# Patient Record
Sex: Female | Born: 1935 | Race: White | Hispanic: No | Smoking: Never smoker
Health system: Southern US, Community
[De-identification: ages and names within clinical notes are randomized; demographics above are authoritative.]

## PROBLEM LIST (undated history)

## (undated) DIAGNOSIS — D649 Anemia, unspecified: Secondary | ICD-10-CM

## (undated) DIAGNOSIS — I639 Cerebral infarction, unspecified: Secondary | ICD-10-CM

## (undated) DIAGNOSIS — I4819 Other persistent atrial fibrillation: Secondary | ICD-10-CM

## (undated) DIAGNOSIS — E039 Hypothyroidism, unspecified: Secondary | ICD-10-CM

## (undated) DIAGNOSIS — S22080A Wedge compression fracture of T11-T12 vertebra, initial encounter for closed fracture: Secondary | ICD-10-CM

## (undated) DIAGNOSIS — A6920 Lyme disease, unspecified: Secondary | ICD-10-CM

## (undated) HISTORY — DX: Lyme disease, unspecified: A69.20

## (undated) HISTORY — DX: Wedge compression fracture of T11-T12 vertebra, initial encounter for closed fracture: S22.080A

## (undated) HISTORY — PX: BLADDER SUSPENSION: SHX72

## (undated) HISTORY — DX: Other persistent atrial fibrillation: I48.19

---

## 1966-03-21 HISTORY — PX: ABDOMINAL HYSTERECTOMY: SHX81

## 2014-08-25 ENCOUNTER — Encounter (HOSPITAL_COMMUNITY): Payer: Self-pay

## 2014-08-25 ENCOUNTER — Emergency Department (HOSPITAL_COMMUNITY)
Admission: EM | Admit: 2014-08-25 | Discharge: 2014-08-25 | Disposition: A | Discharge status: Home / Self Care | Payer: Medicare Other | Attending: Emergency Medicine | Admitting: Emergency Medicine

## 2014-08-25 DIAGNOSIS — I951 Orthostatic hypotension: Secondary | ICD-10-CM | POA: Diagnosis not present

## 2014-08-25 DIAGNOSIS — E871 Hypo-osmolality and hyponatremia: Secondary | ICD-10-CM | POA: Diagnosis not present

## 2014-08-25 DIAGNOSIS — Z87828 Personal history of other (healed) physical injury and trauma: Secondary | ICD-10-CM | POA: Insufficient documentation

## 2014-08-25 DIAGNOSIS — R52 Pain, unspecified: Secondary | ICD-10-CM

## 2014-08-25 DIAGNOSIS — D696 Thrombocytopenia, unspecified: Secondary | ICD-10-CM

## 2014-08-25 HISTORY — DX: Anemia, unspecified: D64.9

## 2014-08-25 LAB — CBC WITH DIFFERENTIAL/PLATELET
BASOS ABS: 0 10*3/uL (ref 0.0–0.1)
Basophils Relative: 1 % (ref 0–1)
Eosinophils Absolute: 0 10*3/uL (ref 0.0–0.7)
Eosinophils Relative: 0 % (ref 0–5)
HEMATOCRIT: 37.5 % (ref 36.0–46.0)
Hemoglobin: 12.5 g/dL (ref 12.0–15.0)
Lymphocytes Relative: 37 % (ref 12–46)
Lymphs Abs: 1.6 10*3/uL (ref 0.7–4.0)
MCH: 29.3 pg (ref 26.0–34.0)
MCHC: 33.3 g/dL (ref 30.0–36.0)
MCV: 87.8 fL (ref 78.0–100.0)
Monocytes Absolute: 0.3 10*3/uL (ref 0.1–1.0)
Monocytes Relative: 7 % (ref 3–12)
NEUTROS PCT: 55 % (ref 43–77)
Neutro Abs: 2.4 10*3/uL (ref 1.7–7.7)
PLATELETS: 115 10*3/uL — AB (ref 150–400)
RBC: 4.27 MIL/uL (ref 3.87–5.11)
RDW: 13.6 % (ref 11.5–15.5)
WBC: 4.3 10*3/uL (ref 4.0–10.5)

## 2014-08-25 LAB — COMPREHENSIVE METABOLIC PANEL
ALT: 20 U/L (ref 14–54)
AST: 39 U/L (ref 15–41)
Albumin: 4 g/dL (ref 3.5–5.0)
Alkaline Phosphatase: 57 U/L (ref 38–126)
Anion gap: 10 (ref 5–15)
BUN: 19 mg/dL (ref 6–20)
CHLORIDE: 98 mmol/L — AB (ref 101–111)
CO2: 25 mmol/L (ref 22–32)
Calcium: 9 mg/dL (ref 8.9–10.3)
Creatinine, Ser: 0.78 mg/dL (ref 0.44–1.00)
GFR calc non Af Amer: >60 mL/min (ref >60)
Glucose, Bld: 117 mg/dL — ABNORMAL HIGH (ref 65–99)
POTASSIUM: 4.4 mmol/L (ref 3.5–5.1)
Sodium: 133 mmol/L — ABNORMAL LOW (ref 135–145)
Total Bilirubin: 0.5 mg/dL (ref 0.3–1.2)
Total Protein: 7.9 g/dL (ref 6.5–8.1)

## 2014-08-25 LAB — URINALYSIS, ROUTINE W REFLEX MICROSCOPIC
BILIRUBIN URINE: NEGATIVE
Glucose, UA: NEGATIVE mg/dL
Hgb urine dipstick: NEGATIVE
Ketones, ur: 15 mg/dL — AB
NITRITE: POSITIVE — AB
PH: 7 (ref 5.0–8.0)
Protein, ur: NEGATIVE mg/dL
SPECIFIC GRAVITY, URINE: 1.008 (ref 1.005–1.030)
Urobilinogen, UA: 0.2 mg/dL (ref 0.0–1.0)

## 2014-08-25 LAB — URINE MICROSCOPIC-ADD ON

## 2014-08-25 LAB — I-STAT TROPONIN, ED: TROPONIN I, POC: 0 ng/mL (ref 0.00–0.08)

## 2014-08-25 LAB — LIPASE, BLOOD: Lipase: 50 U/L (ref 22–51)

## 2014-08-25 MED ORDER — ONDANSETRON HCL 4 MG PO TABS: 4.0000 mg | ORAL_TABLET | Freq: Four times a day (QID) | ORAL | Status: DC

## 2014-08-25 MED ORDER — SODIUM CHLORIDE 0.9 % IV BOLUS (SEPSIS)
500.0000 mL | Freq: Once | INTRAVENOUS | Status: AC
  Administered 2014-08-25: 500 mL via INTRAVENOUS

## 2014-08-25 MED ORDER — SODIUM CHLORIDE 0.9 % IV BOLUS (SEPSIS): 500.0000 mL | Freq: Once | INTRAVENOUS | Status: DC

## 2014-08-25 MED ORDER — ONDANSETRON HCL 4 MG/2ML IJ SOLN: 4.0000 mg | Freq: Once | INTRAMUSCULAR | Status: DC

## 2014-08-25 MED ORDER — DOXYCYCLINE HYCLATE 100 MG PO CAPS: 100.0000 mg | ORAL_CAPSULE | Freq: Two times a day (BID) | ORAL | Status: DC

## 2014-08-25 MED ORDER — IBUPROFEN 200 MG PO TABS
200.0000 mg | ORAL_TABLET | Freq: Once | ORAL | Status: AC
  Administered 2014-08-25: 200 mg via ORAL
  Filled 2014-08-25: qty 1

## 2014-08-25 MED ORDER — ONDANSETRON 4 MG PO TBDP
4.0000 mg | ORAL_TABLET | Freq: Once | ORAL | Status: AC
  Administered 2014-08-25: 4 mg via ORAL
  Filled 2014-08-25: qty 1

## 2014-08-25 MED ORDER — DOXYCYCLINE HYCLATE 100 MG PO TABS
100.0000 mg | ORAL_TABLET | Freq: Once | ORAL | Status: AC
  Administered 2014-08-25: 100 mg via ORAL
  Filled 2014-08-25: qty 1

## 2014-08-25 NOTE — ED Provider Notes (Signed)
CSN: [id number]     Arrival date & time 08/25/14  1130 History   First MD Initiated Contact with Patient 08/25/14 1245     Chief Complaint  Patient presents with  . Tick Removal  . Generalized Body Aches     (Consider location/radiation/quality/duration/timing/severity/associated sxs/prior Treatment) HPI  Caitlyn Henry is a 81 y.o. female with PMH of anemia presenting with one week of generalized weakness, body aches and lightheadedness with standing. No vertiginous symptoms. Pt also reports 20 tick bites over the last few months without rash. Pt reports intermittent headaches that develop gradually frontal and intermittent over last week. No visual changes, slurred speech, confusion or focal weakness. Decreased appetite with occasional nausea. No emesis or abdominal pain. No fevers or chills. Pt has not seen a doctor since the "80s" per family. Pt takes no medication. No head injury, syncope, chest pain, SOB.    Past Medical History  Diagnosis Date  . Anemia    Past Surgical History  Procedure Laterality Date  . Abdominal hysterectomy    . Bladder suspension     No family history on file. History  Substance Use Topics  . Smoking status: Never Smoker   . Smokeless tobacco: Not on file  . Alcohol Use: Yes     Comment: rarely   OB History    No data available     Review of Systems 10 Systems reviewed and are negative for acute change except as noted in the HPI.    Allergies  Tylenol  Home Medications   Prior to Admission medications   Medication Sig Start Date End Date Taking? Authorizing Provider  naproxen sodium (ANAPROX) 220 MG tablet Take 220 mg by mouth 2 (two) times daily as needed (pain).   Yes Historical Provider, MD  doxycycline (VIBRAMYCIN) 100 MG capsule Take 1 capsule (100 mg total) by mouth 2 (two) times daily. 08/25/14   Oswaldo ConroyVictoria Eduardo Honor, PA-C   BP 194/57 mmHg  Pulse 71  Temp(Src) 97.7 F (36.5 C) (Oral)  Resp 19  Ht 5\' 5"  (1.651 m)  SpO2  96% Physical Exam  Constitutional: She appears well-developed and well-nourished. No distress.  HENT:  Head: Normocephalic and atraumatic.  Mouth/Throat: Oropharynx is clear and moist.  Eyes: Conjunctivae and EOM are normal. Pupils are equal, round, and reactive to light. Right eye exhibits no discharge. Left eye exhibits no discharge.  Neck: Normal range of motion. Neck supple.  No nuchal rigidity  Cardiovascular: Normal rate and regular rhythm.   Pulmonary/Chest: Effort normal and breath sounds normal. No respiratory distress. She has no wheezes.  Abdominal: Soft. Bowel sounds are normal. She exhibits no distension. There is no tenderness.  Neurological: She is alert. No cranial nerve deficit. Coordination normal.  Speech is clear and goal oriented. Peripheral visual fields intact. Strength 5/5 in upper and lower extremities. Sensation intact. Intact rapid alternating movements, finger to nose, and heel to shin. No pronator drift. Normal gait.  Skin: Skin is warm and dry. She is not diaphoretic.  Nursing note and vitals reviewed.   ED Course  Procedures (including critical care time) Labs Review Labs Reviewed  CBC WITH DIFFERENTIAL/PLATELET - Abnormal; Notable for the following:    Platelets 115 (*)    All other components within normal limits  COMPREHENSIVE METABOLIC PANEL - Abnormal; Notable for the following:    Sodium 133 (*)    Chloride 98 (*)    Glucose, Bld 117 (*)    All other components within normal limits  LIPASE,  BLOOD  URINALYSIS, ROUTINE W REFLEX MICROSCOPIC (NOT AT [facility])  I-STAT TROPOININ, ED    Imaging Review No results found.   EKG Interpretation   Date/Time:  Monday August 25 2014 12:50:14 EDT Ventricular Rate:  68 PR Interval:  188 QRS Duration: 91 QT Interval:  422 QTC Calculation: 449 R Axis:   57 Text Interpretation:  Sinus rhythm Atrial premature complexes in couplets  Anterior infarct, old Confirmed by Lincoln Brigham [phone]) on 08/25/2014 12:59:01   PM      MDM   Final diagnoses:  Pain, generalized  Orthostasis  Hyponatremia  Thrombocytopenia   Pt presenting with generalized pain, intermittent headaches, lightheadedness and decreased appetite with reported tick bites. No reported fevers. VSS. No fevers. Neurological exam without deficits. Pt ambulatory with steady gait with assistance. Pt orthostatic. Lab work reassuring with mild hyponatremia and thrombocytopenia. No rashes on exam. Will treat prophylactically with doxycycline for 14 days to cover tick borne illness. Pt with PCP follow up in 2 days for further work up. UA pending. Pt signed out to Ladona Mow PA-C pending UA results and discharge home with doxycycline.  Discussed return precautions with patient. Discussed all results and patient and family members verbalizes understanding and agrees with plan.  This is a shared patient. This patient was discussed with the physician who saw and evaluated the patient and agrees with the plan.     Oswaldo Conroy, PA-C 08/27/14 6045  Tilden Fossa, MD 08/27/14 [phone]

## 2014-08-25 NOTE — Discharge Instructions (Signed)
Return to the emergency room with worsening of symptoms, new symptoms or with symptoms that are concerning, especially fevers, severe worsening of headache, visual or speech changes, weakness in face, arms or legs, unable to keep fluids down. Please take all of your antibiotics until finished!   You may develop abdominal discomfort or diarrhea from the antibiotic.  You may help offset this with probiotics which you can buy or get in yogurt. Do not eat  or take the probiotics until 2 hours after your antibiotic.  Please call your doctor for a followup appointment within 24-48 hours. When you talk to your doctor please let them know that you were seen in the emergency department and have them acquire all of your records so that they can discuss the findings with you and formulate a treatment plan to fully care for your new and ongoing problems.  Read below information and follow recommendations.  Orthostatic Hypotension Orthostatic hypotension is a sudden drop in blood pressure. It happens when you quickly stand up from a seated or lying position. You may feel dizzy or light-headed. This can last for just a few seconds or for up to a few minutes. It is usually not a serious problem. However, if this happens frequently or gets worse, it can be a sign of something more serious. CAUSES  Different things can cause orthostatic hypotension, including:   Loss of body fluids (dehydration).  Medicines that lower blood pressure.  Sudden changes in posture, such as standing up quickly after you have been sitting or lying down.  Taking too much of your medicine. SIGNS AND SYMPTOMS   Light-headedness or dizziness.   Fainting or near-fainting.   A fast heart rate.   Weakness.   Feeling tired (fatigue).  DIAGNOSIS  Your health care provider may do several things to help diagnose your condition and identify the cause. These may include:   Taking a medical history and doing a physical  exam.  Checking your blood pressure. Your health care provider will check your blood pressure when you are:  Lying down.  Sitting.  Standing.  Using tilt table testing. In this test, you lie down on a table that moves from a lying position to a standing position. You will be strapped onto the table. This test monitors your blood pressure and heart rate when you are in different positions. TREATMENT  Treatment will vary depending on the cause. Possible treatments include:   Changing the dosage of your medicines.  Wearing compression stockings on your lower legs.  Standing up slowly after sitting or lying down.  Eating more salt.  Eating frequent, small meals.  In some cases, getting IV fluids.  Taking medicine to enhance fluid retention. HOME CARE INSTRUCTIONS  Only take over-the-counter or prescription medicines as directed by your health care provider.  Follow your health care provider's instructions for changing the dosage of your current medicines.  Do not stop or adjust your medicine on your own.  Stand up slowly after sitting or lying down. This allows your body to adjust to the different position.  Wear compression stockings as directed.  Eat extra salt as directed.  Do not add extra salt to your diet unless directed to by your health care provider.  Eat frequent, small meals.  Avoid standing suddenly after eating.  Avoid hot showers or excessive heat as directed by your health care provider.  Keep all follow-up appointments. SEEK MEDICAL CARE IF:  You continue to feel dizzy or light-headed after standing.  You feel groggy or confused.  You feel cold, clammy, or sick to your stomach (nauseous).  You have blurred vision.  You feel short of breath. SEEK IMMEDIATE MEDICAL CARE IF:   You faint after standing.  You have chest pain.  You have difficulty breathing.   You lose feeling or movement in your arms or legs.   You have slurred speech  or difficulty talking, or you are unable to talk.  MAKE SURE YOU:   Understand these instructions.  Will watch your condition.  Will get help right away if you are not doing well or get worse. Document Released: 02/25/2002 Document Revised: 03/12/2013 Document Reviewed: 12/28/2012 [facility] Patient Information 2015 [place], [place]. This information is not intended to replace advice given to you by your health care provider. Make sure you discuss any questions you have with your health care provider.   Tick Bite Information Ticks are insects that attach themselves to the skin and draw blood for food. There are various types of ticks. Common types include wood ticks and deer ticks. Most ticks live in shrubs and grassy areas. Ticks can climb onto your body when you make contact with leaves or grass where the tick is waiting. The most common places on the body for ticks to attach themselves are the scalp, neck, armpits, waist, and groin. Most tick bites are harmless, but sometimes ticks carry germs that cause diseases. These germs can be spread to a person during the tick's feeding process. The chance of a disease spreading through a tick bite depends on:   The type of tick.  Time of year.   How long the tick is attached.   Geographic location.  HOW CAN YOU PREVENT TICK BITES? Take these steps to help prevent tick bites when you are outdoors:  Wear protective clothing. Long sleeves and long pants are best.   Wear white clothes so you can see ticks more easily.  Tuck your pant legs into your socks.   If walking on a trail, stay in the middle of the trail to avoid brushing against bushes.  Avoid walking through areas with long grass.  Put insect repellent on all exposed skin and along boot tops, pant legs, and sleeve cuffs.   Check clothing, hair, and skin repeatedly and before going inside.   Brush off any ticks that are not attached.  Take a shower or bath as soon as  possible after being outdoors.  WHAT IS THE PROPER WAY TO REMOVE A TICK? Ticks should be removed as soon as possible to help prevent diseases caused by tick bites.  If latex gloves are available, put them on before trying to remove a tick.   Using fine-point tweezers, grasp the tick as close to the skin as possible. You may also use curved forceps or a tick removal tool. Grasp the tick as close to its head as possible. Avoid grasping the tick on its body.  Pull gently with steady upward pressure until the tick lets go. Do not twist the tick or jerk it suddenly. This may break off the tick's head or mouth parts.  Do not squeeze or crush the tick's body. This could force disease-carrying fluids from the tick into your body.   After the tick is removed, wash the bite area and your hands with soap and water or other disinfectant such as alcohol.  Apply a small amount of antiseptic cream or ointment to the bite site.   Wash and disinfect any instruments that  were used.  Do not try to remove a tick by applying a hot match, petroleum jelly, or fingernail polish to the tick. These methods do not work and may increase the chances of disease being spread from the tick bite.  WHEN SHOULD YOU SEEK MEDICAL CARE? Contact your health care provider if you are unable to remove a tick from your skin or if a part of the tick breaks off and is stuck in the skin.  After a tick bite, you need to be aware of signs and symptoms that could be related to diseases spread by ticks. Contact your health care provider if you develop any of the following in the days or weeks after the tick bite:  Unexplained fever.  Rash. A circular rash that appears days or weeks after the tick bite may indicate the possibility of Lyme disease. The rash may resemble a target with a bull's-eye and may occur at a different part of your body than the tick bite.  Redness and swelling in the area of the tick bite.   Tender, swollen  lymph glands.   Diarrhea.   Weight loss.   Cough.   Fatigue.   Muscle, joint, or bone pain.   Abdominal pain.   Headache.   Lethargy or a change in your level of consciousness.  Difficulty walking or moving your legs.   Numbness in the legs.   Paralysis.  Shortness of breath.   Confusion.   Repeated vomiting.  Document Released: 03/04/2000 Document Revised: 12/26/2012 Document Reviewed: 08/15/2012 [facility] Patient Information 2015 [place]. This information is not intended to replace advice given to you by your health care provider. Make sure you discuss any questions you have with your health care provider.

## 2014-08-25 NOTE — ED Notes (Signed)
Pt here from urgent care for multiple tick bites since last Monday and HTN. Has been getting weaker since Monday. Has been having generalized body aches all over. No fever in triage. Denies chills or fever at home. Has been having occasional nausea but no vomiting or diarrhea. Decreased appetite.

## 2014-08-25 NOTE — ED Provider Notes (Signed)
Patient signed out to me by Oswaldo ConroyVictoria Creech, PA-C with plan to f/u on results of UA, and discharge when results return.    Patient here being seen and evaluated generalized pain, poor appetite, tick bite. Patient being discharged with doxycycline coverage for possible tickborne illness. Patient does have PCP follow-up in 2 days. Patient is well-appearing, hemodynamically stable in no acute distress. Patient's urinalysis shows positive for bacteria and nitrites. She denies any urinary complaints. Based on patient's age and the fact the patient is being sent home with some doxycycline, will not cover for UTI at this time. Urine culture sent. Patient to be discharged. Patient requesting prescription for Zofran, we'll provide with discharge paperwork. Return precautions discussed, patient and family verbalize understanding and agreement of this plan.  BP 194/57 mmHg  Pulse 71  Temp(Src) 97.7 F (36.5 C) (Oral)  Resp 19  Ht 5\' 5"  (1.651 m)  SpO2 96%  Signed,  Ladona MowJoe Doria Fern, PA-C 4:37 PM   Ladona MowJoe Merisa Julio, PA-C 08/25/14 1637  Benjiman CoreNathan Pickering, MD 08/26/14 [phone]

## 2014-08-28 LAB — URINE CULTURE

## 2014-08-29 ENCOUNTER — Telehealth: Payer: Self-pay | Admitting: *Deleted

## 2014-08-29 NOTE — ED Notes (Signed)
(+)  urine culture, treated with Doxycycline, OK per Georgina Pillion, Pharm

## 2015-03-22 DIAGNOSIS — A6920 Lyme disease, unspecified: Secondary | ICD-10-CM

## 2015-03-22 HISTORY — DX: Lyme disease, unspecified: A69.20

## 2015-06-20 DIAGNOSIS — I4819 Other persistent atrial fibrillation: Secondary | ICD-10-CM

## 2015-06-20 DIAGNOSIS — I639 Cerebral infarction, unspecified: Secondary | ICD-10-CM

## 2015-06-20 HISTORY — DX: Cerebral infarction, unspecified: I63.9

## 2015-06-20 HISTORY — PX: TRANSTHORACIC ECHOCARDIOGRAM: SHX275

## 2015-06-20 HISTORY — DX: Other persistent atrial fibrillation: I48.19

## 2015-07-14 ENCOUNTER — Observation Stay (HOSPITAL_COMMUNITY)
Admission: EM | Admit: 2015-07-14 | Discharge: 2015-07-17 | Disposition: A | Discharge status: Home / Self Care | Payer: Medicare Other | Attending: Internal Medicine | Admitting: Internal Medicine

## 2015-07-14 ENCOUNTER — Observation Stay (HOSPITAL_COMMUNITY): Payer: Medicare Other

## 2015-07-14 ENCOUNTER — Emergency Department (HOSPITAL_COMMUNITY): Payer: Medicare Other

## 2015-07-14 ENCOUNTER — Encounter (HOSPITAL_COMMUNITY): Payer: Self-pay | Admitting: Family Medicine

## 2015-07-14 DIAGNOSIS — Z8673 Personal history of transient ischemic attack (TIA), and cerebral infarction without residual deficits: Secondary | ICD-10-CM | POA: Diagnosis not present

## 2015-07-14 DIAGNOSIS — R768 Other specified abnormal immunological findings in serum: Secondary | ICD-10-CM | POA: Diagnosis present

## 2015-07-14 DIAGNOSIS — A6920 Lyme disease, unspecified: Secondary | ICD-10-CM | POA: Insufficient documentation

## 2015-07-14 DIAGNOSIS — R42 Dizziness and giddiness: Secondary | ICD-10-CM

## 2015-07-14 DIAGNOSIS — D649 Anemia, unspecified: Secondary | ICD-10-CM | POA: Insufficient documentation

## 2015-07-14 DIAGNOSIS — R110 Nausea: Secondary | ICD-10-CM | POA: Diagnosis present

## 2015-07-14 DIAGNOSIS — I480 Paroxysmal atrial fibrillation: Secondary | ICD-10-CM | POA: Diagnosis not present

## 2015-07-14 DIAGNOSIS — E039 Hypothyroidism, unspecified: Secondary | ICD-10-CM

## 2015-07-14 DIAGNOSIS — Z79899 Other long term (current) drug therapy: Secondary | ICD-10-CM | POA: Diagnosis not present

## 2015-07-14 DIAGNOSIS — I159 Secondary hypertension, unspecified: Secondary | ICD-10-CM

## 2015-07-14 DIAGNOSIS — M6289 Other specified disorders of muscle: Secondary | ICD-10-CM | POA: Diagnosis not present

## 2015-07-14 DIAGNOSIS — R51 Headache: Secondary | ICD-10-CM | POA: Diagnosis not present

## 2015-07-14 DIAGNOSIS — R531 Weakness: Secondary | ICD-10-CM | POA: Diagnosis not present

## 2015-07-14 DIAGNOSIS — I749 Embolism and thrombosis of unspecified artery: Secondary | ICD-10-CM

## 2015-07-14 DIAGNOSIS — Z7189 Other specified counseling: Secondary | ICD-10-CM | POA: Insufficient documentation

## 2015-07-14 DIAGNOSIS — G459 Transient cerebral ischemic attack, unspecified: Secondary | ICD-10-CM | POA: Insufficient documentation

## 2015-07-14 DIAGNOSIS — I4819 Other persistent atrial fibrillation: Secondary | ICD-10-CM | POA: Diagnosis present

## 2015-07-14 DIAGNOSIS — Z7901 Long term (current) use of anticoagulants: Secondary | ICD-10-CM | POA: Insufficient documentation

## 2015-07-14 DIAGNOSIS — I4891 Unspecified atrial fibrillation: Secondary | ICD-10-CM | POA: Diagnosis not present

## 2015-07-14 DIAGNOSIS — I10 Essential (primary) hypertension: Secondary | ICD-10-CM | POA: Diagnosis present

## 2015-07-14 DIAGNOSIS — J984 Other disorders of lung: Secondary | ICD-10-CM

## 2015-07-14 DIAGNOSIS — R262 Difficulty in walking, not elsewhere classified: Secondary | ICD-10-CM | POA: Insufficient documentation

## 2015-07-14 DIAGNOSIS — I639 Cerebral infarction, unspecified: Secondary | ICD-10-CM | POA: Diagnosis present

## 2015-07-14 DIAGNOSIS — R519 Headache, unspecified: Secondary | ICD-10-CM | POA: Diagnosis present

## 2015-07-14 HISTORY — DX: Hypothyroidism, unspecified: E03.9

## 2015-07-14 HISTORY — DX: Cerebral infarction, unspecified: I63.9

## 2015-07-14 LAB — COMPREHENSIVE METABOLIC PANEL
ALBUMIN: 4.3 g/dL (ref 3.5–5.0)
ALT: 19 U/L (ref 14–54)
AST: 25 U/L (ref 15–41)
Alkaline Phosphatase: 51 U/L (ref 38–126)
Anion gap: 10 (ref 5–15)
BUN: 19 mg/dL (ref 6–20)
CALCIUM: 9.5 mg/dL (ref 8.9–10.3)
CO2: 26 mmol/L (ref 22–32)
Chloride: 98 mmol/L — ABNORMAL LOW (ref 101–111)
Creatinine, Ser: 0.92 mg/dL (ref 0.44–1.00)
GFR calc Af Amer: >60 mL/min (ref >60)
GFR calc non Af Amer: 54 mL/min — ABNORMAL LOW (ref >60)
GLUCOSE: 119 mg/dL — AB (ref 65–99)
Potassium: 4.6 mmol/L (ref 3.5–5.1)
Sodium: 134 mmol/L — ABNORMAL LOW (ref 135–145)
TOTAL PROTEIN: 8.1 g/dL (ref 6.5–8.1)
Total Bilirubin: 0.8 mg/dL (ref 0.3–1.2)

## 2015-07-14 LAB — RAPID URINE DRUG SCREEN, HOSP PERFORMED
Amphetamines: NOT DETECTED
BARBITURATES: NOT DETECTED
BENZODIAZEPINES: NOT DETECTED
Cocaine: NOT DETECTED
Opiates: NOT DETECTED
Tetrahydrocannabinol: NOT DETECTED

## 2015-07-14 LAB — CBC WITH DIFFERENTIAL/PLATELET
Basophils Absolute: 0 10*3/uL (ref 0.0–0.1)
Basophils Relative: 0 %
EOS ABS: 0 10*3/uL (ref 0.0–0.7)
EOS PCT: 0 %
HCT: 41 % (ref 36.0–46.0)
HEMOGLOBIN: 13.5 g/dL (ref 12.0–15.0)
Lymphocytes Relative: 24 %
Lymphs Abs: 1.8 10*3/uL (ref 0.7–4.0)
MCH: 29.4 pg (ref 26.0–34.0)
MCHC: 32.9 g/dL (ref 30.0–36.0)
MCV: 89.3 fL (ref 78.0–100.0)
Monocytes Absolute: 0.3 10*3/uL (ref 0.1–1.0)
Monocytes Relative: 4 %
Neutro Abs: 5.4 10*3/uL (ref 1.7–7.7)
Neutrophils Relative %: 72 %
Platelets: 214 10*3/uL (ref 150–400)
RBC: 4.59 MIL/uL (ref 3.87–5.11)
RDW: 13.9 % (ref 11.5–15.5)
WBC: 7.6 10*3/uL (ref 4.0–10.5)

## 2015-07-14 LAB — URINALYSIS, ROUTINE W REFLEX MICROSCOPIC
Bilirubin Urine: NEGATIVE
Glucose, UA: NEGATIVE mg/dL
Ketones, ur: 15 mg/dL — AB
LEUKOCYTES UA: NEGATIVE
Nitrite: NEGATIVE
PH: 7 (ref 5.0–8.0)
Protein, ur: NEGATIVE mg/dL
Specific Gravity, Urine: 1.017 (ref 1.005–1.030)

## 2015-07-14 LAB — I-STAT TROPONIN, ED: TROPONIN I, POC: 0.01 ng/mL (ref 0.00–0.08)

## 2015-07-14 LAB — LIPID PANEL
CHOL/HDL RATIO: 3.2 ratio
CHOLESTEROL: 202 mg/dL — AB (ref 0–200)
HDL: 63 mg/dL (ref >40)
LDL CALC: 125 mg/dL — AB (ref 0–99)
TRIGLYCERIDES: 70 mg/dL (ref <150)
VLDL: 14 mg/dL (ref 0–40)

## 2015-07-14 LAB — URINE MICROSCOPIC-ADD ON

## 2015-07-14 LAB — ETHANOL

## 2015-07-14 LAB — TROPONIN I: Troponin I: <0.03 ng/mL (ref <0.031)

## 2015-07-14 LAB — PROTIME-INR
INR: 1.07 (ref 0.00–1.49)
PROTHROMBIN TIME: 14.1 s (ref 11.6–15.2)

## 2015-07-14 LAB — APTT: aPTT: 30 seconds (ref 24–37)

## 2015-07-14 LAB — TSH: TSH: 5.526 u[IU]/mL — ABNORMAL HIGH (ref 0.350–4.500)

## 2015-07-14 MED ORDER — LEVOTHYROXINE SODIUM 50 MCG PO TABS
50.0000 ug | ORAL_TABLET | Freq: Every day | ORAL | Status: DC
  Administered 2015-07-15 – 2015-07-17 (×3): 50 ug via ORAL
  Filled 2015-07-14 (×3): qty 1

## 2015-07-14 MED ORDER — IOPAMIDOL (ISOVUE-370) INJECTION 76%
INTRAVENOUS | Status: AC
  Administered 2015-07-14: 50 mL
  Filled 2015-07-14: qty 50

## 2015-07-14 MED ORDER — KETOROLAC TROMETHAMINE 30 MG/ML IJ SOLN: 15.0000 mg | Freq: Once | INTRAMUSCULAR | Status: DC

## 2015-07-14 MED ORDER — ENOXAPARIN SODIUM 40 MG/0.4ML ~~LOC~~ SOLN
40.0000 mg | SUBCUTANEOUS | Status: DC
Start: 2015-07-14 — End: 2015-07-15
  Administered 2015-07-14: 40 mg via SUBCUTANEOUS
  Filled 2015-07-14 (×2): qty 0.4

## 2015-07-14 MED ORDER — ASPIRIN 325 MG PO TABS
325.0000 mg | ORAL_TABLET | Freq: Every day | ORAL | Status: DC
Start: 2015-07-14 — End: 2015-07-16
  Administered 2015-07-14 – 2015-07-16 (×3): 325 mg via ORAL
  Filled 2015-07-14 (×3): qty 1

## 2015-07-14 MED ORDER — STROKE: EARLY STAGES OF RECOVERY BOOK
Freq: Once | Status: AC
  Administered 2015-07-14: 19:00:00
  Filled 2015-07-14: qty 1

## 2015-07-14 MED ORDER — ASPIRIN 300 MG RE SUPP: 300.0000 mg | Freq: Once | RECTAL | Status: DC

## 2015-07-14 MED ORDER — PENICILLIN V POTASSIUM 250 MG PO TABS
500.0000 mg | ORAL_TABLET | Freq: Two times a day (BID) | ORAL | Status: DC
  Administered 2015-07-14 – 2015-07-17 (×6): 500 mg via ORAL
  Filled 2015-07-14: qty 1
  Filled 2015-07-14 (×3): qty 2
  Filled 2015-07-14: qty 1
  Filled 2015-07-14 (×2): qty 2

## 2015-07-14 MED ORDER — DIPHENHYDRAMINE HCL 50 MG/ML IJ SOLN: 12.5000 mg | Freq: Once | INTRAMUSCULAR | Status: DC | PRN

## 2015-07-14 MED ORDER — METOCLOPRAMIDE HCL 5 MG/ML IJ SOLN
5.0000 mg | Freq: Once | INTRAMUSCULAR | Status: AC
  Administered 2015-07-14: 5 mg via INTRAVENOUS
  Filled 2015-07-14: qty 2

## 2015-07-14 MED ORDER — DOXYCYCLINE HYCLATE 50 MG PO CAPS
50.0000 mg | ORAL_CAPSULE | Freq: Two times a day (BID) | ORAL | Status: DC
  Administered 2015-07-14 – 2015-07-15 (×2): 50 mg via ORAL
  Filled 2015-07-14 (×3): qty 1

## 2015-07-14 MED ORDER — SODIUM CHLORIDE 0.9 % IV SOLN
INTRAVENOUS | Status: AC
  Administered 2015-07-14: 19:00:00 via INTRAVENOUS

## 2015-07-14 MED ORDER — ONDANSETRON HCL 4 MG/2ML IJ SOLN
4.0000 mg | Freq: Once | INTRAMUSCULAR | Status: AC
  Administered 2015-07-14: 4 mg via INTRAVENOUS
  Filled 2015-07-14: qty 2

## 2015-07-14 MED ORDER — SENNOSIDES-DOCUSATE SODIUM 8.6-50 MG PO TABS
1.0000 | ORAL_TABLET | Freq: Every evening | ORAL | Status: DC | PRN
  Administered 2015-07-17: 1 via ORAL
  Filled 2015-07-14: qty 1

## 2015-07-14 NOTE — ED Notes (Signed)
MD at bedside. 

## 2015-07-14 NOTE — ED Notes (Signed)
PA at bedside.

## 2015-07-14 NOTE — ED Provider Notes (Signed)
CSN: [id number]     Arrival date & time 07/14/15  1128 History   First MD Initiated Contact with Patient 07/14/15 1155     Chief Complaint  Patient presents with  . Headache  . Nausea     (Consider location/radiation/quality/duration/timing/severity/associated sxs/prior Treatment) HPI   [name] is a 82 y.o. female, with a history of anemia, presenting to the ED with sudden onset of a lateral frontal headache that patient originally stated began at 7:30 a.m. this morning, however, pt then later said that she remembered that she actually woke up with the headache and nausea, but felt normal last night. Patient also complains of nausea with the headache. Patient rates her headache at 10 out of 10, describes it as a pressure, nonradiating. Patient also complains of some posterior neck pain. Of note, patient had a positive Lyme disease IgG. Patient has been receiving doxycycline 50 mg twice a day and penicillin V 500 mg twice a day. Patient is 2 weeks into a three-week regimen of these antibiotics. Patient was seen for follow-up by Dr. Aida Puffer on April 18. Patient's daughter is at the bedside. Daughter states that the patient seems to be answering questions more slowly and having difficulty thinking. Dr. Clarene Duke was contacted today and advised them to bring the patient to the ED. Patient denies fever/chills, vomiting, chest pain, shortness of breath, visual disturbances, neuro deficits, or any other complaints.  Patient was asked how she acquired Lyme disease. Patient states that she gets bitten by ticks frequently, [place] and [place] animals, especially dogs, that carry ticks.   Past Medical History  Diagnosis Date  . Anemia   . Hypothyroid   . Stroke [facility])    Past Surgical History  Procedure Laterality Date  . Abdominal hysterectomy    . Bladder suspension     History reviewed. No pertinent family history. Social History  Substance Use Topics  .  Smoking status: Never Smoker   . Smokeless tobacco: None  . Alcohol Use: Yes     Comment: rarely   OB History    No data available     Review of Systems  Constitutional: Negative for fever and chills.  HENT: Negative for facial swelling and trouble swallowing.   Respiratory: Negative for shortness of breath.   Cardiovascular: Negative for chest pain.  Gastrointestinal: Positive for nausea. Negative for vomiting and abdominal pain.  Neurological: Positive for headaches.  All other systems reviewed and are negative.     Allergies  Tylenol  Home Medications   Prior to Admission medications   Medication Sig Start Date End Date Taking? Authorizing Provider  doxycycline (ADOXA) 50 MG tablet Take 50 mg by mouth 2 (two) times daily. 07/07/15  Yes Historical Provider, MD  ferrous gluconate (FERGON) 240 (27 FE) MG tablet Take 240 mg by mouth daily. 04/28/15  Yes Historical Provider, MD  levothyroxine (SYNTHROID, LEVOTHROID) 50 MCG tablet Take 50 mcg by mouth daily. 06/24/15  Yes Historical Provider, MD  penicillin v potassium (VEETID) 500 MG tablet Take 500 mg by mouth 2 (two) times daily. 07/07/15  Yes Historical Provider, MD  Vitamin D, Ergocalciferol, (DRISDOL) 50000 units CAPS capsule Take 50,000 Units by mouth once a week. 06/25/15  Yes Historical Provider, MD   BP 172/93 mmHg  Pulse 98  Temp(Src) 97.6 F (36.4 C) (Oral)  Resp 20  SpO2 97% Physical Exam  Constitutional: She appears well-developed and well-nourished. No distress.  HENT:  Head: Normocephalic and  atraumatic.  Mouth/Throat: Oropharynx is clear and moist.  Eyes: Conjunctivae and EOM are normal. Pupils are equal, round, and reactive to light.  Neck: Normal range of motion. Neck supple.  Cardiovascular: Normal rate, regular rhythm, normal heart sounds and intact distal pulses.   Pulmonary/Chest: Effort normal and breath sounds normal. No respiratory distress.  Abdominal: Soft. There is no tenderness. There is no  guarding.  Musculoskeletal: She exhibits no edema or tenderness.  Full ROM in all extremities and spine. No paraspinal tenderness.   Lymphadenopathy:    She has no cervical adenopathy.  Neurological: She is alert. She has normal reflexes.  Objective weakness in the left arm and left leg, rated 4 out of 5. Decreased sensation in the left arm and left leg as well. Patient is able to voice when her right leg or right arm is touched, however, when both right and left sides are touched and her eyes are closed, patient only voices that she feels the sensation on the right. No sensory deficits on the right. Strength 5 out of 5 on the right.   Skin: Skin is warm and dry. She is not diaphoretic.  Psychiatric: She has a normal mood and affect. Her behavior is normal.  Nursing note and vitals reviewed.   ED Course  Procedures (including critical care time) Labs Review Labs Reviewed  COMPREHENSIVE METABOLIC PANEL - Abnormal; Notable for the following:    Sodium 134 (*)    Chloride 98 (*)    Glucose, Bld 119 (*)    GFR calc non Af Amer 54 (*)    All other components within normal limits  URINE CULTURE  CBC WITH DIFFERENTIAL/PLATELET  ETHANOL  PROTIME-INR  APTT  URINALYSIS, ROUTINE W REFLEX MICROSCOPIC (NOT AT [facility])  URINE RAPID DRUG SCREEN, HOSP PERFORMED  HEMOGLOBIN A1C  LIPID PANEL  TSH  TROPONIN I  I-STAT TROPOININ, ED    Imaging Review Ct Head Wo Contrast  07/14/2015  CLINICAL DATA:  Severe headache. EXAM: CT HEAD WITHOUT CONTRAST TECHNIQUE: Contiguous axial images were obtained from the base of the skull through the vertex without intravenous contrast. COMPARISON:  None. FINDINGS: Bony calvarium appears intact. Mild diffuse cortical atrophy is noted. Mild chronic ischemic white matter disease is noted. No mass effect or midline shift is noted. Ventricular size is within normal limits. There is no evidence of mass lesion, hemorrhage or acute infarction. IMPRESSION: Mild diffuse  cortical atrophy. Mild chronic ischemic white matter disease. No acute intracranial abnormality seen. Electronically Signed   By: Lupita Raider, M.D.   On: 07/14/2015 13:15   I have personally reviewed and evaluated these images and lab results as part of my medical decision-making.   EKG Interpretation   Date/Time:  Tuesday July 14 2015 12:30:48 EDT Ventricular Rate:  93 PR Interval:    QRS Duration: 97 QT Interval:  373 QTC Calculation: 464 R Axis:   69 Text Interpretation:  Atrial fibrillation Anterior infarct, old Minimal ST  depression, lateral leads Baseline wander in lead(s) II aVF Atrial  fibrillation Abnormal ekg Confirmed by Gerhard Munch  MD [phone]) on  07/14/2015 12:32:56 PM Also confirmed by Gerhard Munch  MD ([id number]),  editor [place] CT, Jola Babinski [phone])  on 07/14/2015 12:59:12 PM      MDM   Final diagnoses:  Acute nonintractable headache, unspecified headache type  Nausea  Acute left-sided weakness    [place] presents with onset of headache and nausea that the patient noticed upon wakening this morning.  Findings  and plan of care discussed with Gerhard Munchobert Lockwood, MD. Dr. Jeraldine LootsLockwood personally evaluated and examined this patient.  Code stroke initially activated due to the patient's initial report of sudden onset of her symptoms at 7:30 AM this morning, however, the patient then told the RN that she woke up with dizziness and nausea and "not feeling right." This puts the last seen normal last night. 2:07 PM Spoke with Dr. Amada JupiterKirkpatrick, Neurologist, who recommended a MR brain and CTA head and neck. States he will come see the patient soon. Hold off on admission until then. Further recommended Reglan for her headache. 3:01 PM Dr. Amada JupiterKirkpatrick examined the patient and states that he believes the patient may have had a stroke. Patient is outside the TPA window. He recommends admission for continued stroke workup. 3:12 PM Spoke with Toya SmothersKaren Black, NP for [facility], who agreed to admit the patient for observation to a telemetry bed under Shelly Flattenavid Merrell as the attending. Patient and her family were updated with this information, including the suspicion of stroke and the plan for admission. Both parties are comfortable with admission.  Filed Vitals:   07/14/15 1144 07/14/15 1245 07/14/15 1328 07/14/15 1400  BP: 176/90     Pulse: 97 85 84 95  Temp: 97.6 F (36.4 C)     TempSrc: Oral     Resp: 14 13 18 19   SpO2: 97% 95% 97% 98%   Filed Vitals:   07/14/15 1400 07/14/15 1415 07/14/15 1430 07/14/15 1500  BP:  170/78 160/97 172/93  Pulse: 95 98    Temp:      TempSrc:      Resp: 19 21 16 20   SpO2: 98% 97%       Anselm PancoastShawn C Joy, PA-C 07/14/15 1546  Gerhard Munchobert Lockwood, MD 07/15/15 1120

## 2015-07-14 NOTE — ED Notes (Signed)
Pt here for headache that started this am. sts unusual for her. Pt started on treatment for Lyme's disease 2 weeks ago. sts very nauseous. Pt on doxy and PCN

## 2015-07-14 NOTE — ED Notes (Signed)
Neuro PA at bedside.  

## 2015-07-14 NOTE — H&P (Signed)
[facility] History and Physical  [place] WUJ:[id number] DOB: 24-Nov-1923 DOA: 07/14/2015  Referring physician:  PCP: Aida Puffer, MD [place]  Chief Complaint: headache  HPI: Caitlyn Henry is a 82 y.o. female with past medical history of anemia, and recent positive Lyme IgG presents to emergency department with a chief complaint of sudden onset headache. Initial evaluation concerning for stroke.  Information is obtained from the chart and the patient. Patient  reports awakened with left frontal headache this morning.  Associated symptoms include nausea no emesis and generalized weakness. She denies any visual disturbances focal weakness numbness or tingling of extremities. Describes left frontal as a pressure constant and rated 10 out of 10 in the emergency department. Patient's daughter indicates patient speaks slower and more deliberate but not slurred. Reportedly she "also seems just slightly to respond in general". Patient denies any chest pain palpitations headache dizziness syncope or near syncope. She denies any shortness of breath lower extremity edema orthopnea. She denies any fever chills abdominal pain dysuria hematuria frequency urgency. Denies any difficulty swallowing she denies any gait disturbances.  Of note patient recently had positive Lyme disease IgG and started doxycycline and penicillin V.  In the ed she is evaluated by neuro who recommend admission for stroke work up. He is outside the window of TPA. While in emergency department she is afebrile hemodynamically stable and not hypoxic    Review of Systems:  10 pt review of system complete all negative except as indicated by HPI Past Medical History  Diagnosis Date  . Anemia   . Hypothyroid   . Stroke [facility])    Past Surgical History  Procedure Laterality Date  . Abdominal hysterectomy    . Bladder suspension     Social History:  reports that she has never smoked. She does not have any smokeless  tobacco history on file. She reports that she drinks alcohol. She reports that she does not use illicit drugs. Visit home with one of her children. She is ambulatory independently. She is independent with ADLs Allergies  Allergen Reactions  . Tylenol [Acetaminophen] Other (See Comments)    Makes my body feel numb     History reviewed. No pertinent family history. Mother family medical history includes breast cancer. She has 3 siblings are collected medical history positive for heart disease and prostate cancer  Prior to Admission medications   Medication Sig Start Date End Date Taking? Authorizing Provider  doxycycline (ADOXA) 50 MG tablet Take 50 mg by mouth 2 (two) times daily. 07/07/15  Yes Historical Provider, MD  ferrous gluconate (FERGON) 240 (27 FE) MG tablet Take 240 mg by mouth daily. 04/28/15  Yes Historical Provider, MD  levothyroxine (SYNTHROID, LEVOTHROID) 50 MCG tablet Take 50 mcg by mouth daily. 06/24/15  Yes Historical Provider, MD  penicillin v potassium (VEETID) 500 MG tablet Take 500 mg by mouth 2 (two) times daily. 07/07/15  Yes Historical Provider, MD  Vitamin D, Ergocalciferol, (DRISDOL) 50000 units CAPS capsule Take 50,000 Units by mouth once a week. 06/25/15  Yes Historical Provider, MD   Physical Exam: Filed Vitals:   07/14/15 1400 07/14/15 1415 07/14/15 1430 07/14/15 1500  BP:  170/78 160/97 172/93  Pulse: 95 98    Temp:      TempSrc:      Resp: SpO2: 98% 97%      Wt Readings from Last 3 Encounters:  No data found for Wt    General:  Appears calm and comfortable, somewhat  frail and very Eyes: PERRL, normal lids, irises & conjunctiva ENT: grossly normal hearing, lips & tongue, mucous membranes of her mouth slightly pale. Dry Neck: no LAD, masses or thyromegaly Cardiovascular: RRR, no m/r/g. No LE edema. Pedal pulses present and palpable  Respiratory: CTA bilaterally, no w/r/r. Normal respiratory effort. Abdomen: soft, ntnd positive bowel sounds  throughout no guarding or rebounding Skin: no rash or induration seen on limited exam, skin: Dry Musculoskeletal: grossly normal tone BUE/BLE, joints without swelling/erythema Psychiatric: grossly normal mood and affect, speech fluent and appropriate Neurologic: Alert and oriented 3 speech clear facial symmetry tongue midline bilateral grip 4 out of 5 bilateral lower extremity strength 4 out of 5           Labs on Admission:  Basic Metabolic Panel:  Recent Labs Lab 07/14/15 1326  NA 134*  K 4.6  CL 98*  CO2 26  GLUCOSE 119*  BUN 19  CREATININE 0.92  CALCIUM 9.5   Liver Function Tests:  Recent Labs Lab 07/14/15 1326  AST 25  ALT 19  ALKPHOS 51  BILITOT 0.8  PROT 8.1  ALBUMIN 4.3   No results for input(s): LIPASE, AMYLASE in the last 168 hours. No results for input(s): AMMONIA in the last 168 hours. CBC:  Recent Labs Lab 07/14/15 1326  WBC 7.6  NEUTROABS 5.4  HGB 13.5  HCT 41.0  MCV 89.3  PLT 214   Cardiac Enzymes: No results for input(s): CKTOTAL, CKMB, CKMBINDEX, TROPONINI in the last 168 hours.  BNP (last 3 results) No results for input(s): BNP in the last 8760 hours.  ProBNP (last 3 results) No results for input(s): PROBNP in the last 8760 hours.  CBG: No results for input(s): GLUCAP in the last 168 hours.  Radiological Exams on Admission: Ct Angio Head W/cm &/or Wo Cm  07/14/2015  CLINICAL DATA:  82 year old female with altered mental status and difficulty finding words. Headache. Subsequent encounter. EXAM: CT ANGIOGRAPHY HEAD AND NECK TECHNIQUE: Multidetector CT imaging of the head and neck was performed using the standard protocol during bolus administration of intravenous contrast. Multiplanar CT image reconstructions and MIPs were obtained to evaluate the vascular anatomy. Carotid stenosis measurements (when applicable) are obtained utilizing NASCET criteria, using the distal internal carotid diameter as the denominator. CONTRAST:  50 cc Isovue  370. COMPARISON:  07/14/2015 brain MR and head CT. FINDINGS: CT HEAD Brain: No intracranial hemorrhage or CT evidence of large acute infarct. No intracranial mass or abnormal enhancement. Global atrophy without hydrocephalus. Small vessel disease changes. Calvarium and skull base: Negative. Paranasal sinuses: Clear. Orbits: Negative. CTA NECK Aortic arch: 3 vessel arch. Right carotid system: Mild plaque right carotid bifurcation without significant narrowing. Left carotid system: Mild plaque origin left common carotid artery. Plaque proximal left internal carotid artery without significant stenosis. Vertebral arteries:No significant stenosis of the vertebral arteries. Skeleton: No osseous destructive lesion. Cervical spondylotic changes without high-grade spinal stenosis. Other neck: No worrisome primary neck mass. Upper chest: Lung parenchymal changes most notable posterior aspect right upper lobe with there is a 2 cm cavitary pleural-based lesion. CT of the chest recommended for further delineation. This can be performed without contrast as patient has had contrast for the current exam. CTA HEAD Anterior circulation: Calcified internal carotid artery cavernous segment with mild moderate narrowing bilaterally. Mild moderate narrowing supraclinoid aspect left internal carotid artery. No significant stenosis M1 segment of either middle cerebral artery. Middle cerebral artery mild to moderate branch vessel narrowing and irregularity. Mild moderate narrowing  A1 segment left anterior cerebral artery. A2 segment anterior cerebral artery mild branch vessel irregularity bilaterally. Posterior circulation: Left vertebral artery is dominant. Mild tandem stenosis of the distal left vertebral artery. Mild narrowing distal right vertebral artery. Mild narrowing irregularity basilar artery without high-grade stenosis. Moderate to marked narrowing P1 -2 junction posterior cerebral artery bilaterally. Mild moderate narrowing  distal posterior cerebral artery branches bilaterally. Venous sinuses: Patent. Anatomic variants: Negative. Delayed phase: Negative. IMPRESSION: CT HEAD No intracranial hemorrhage or CT evidence of large acute infarct. No intracranial mass or abnormal enhancement. Global atrophy without hydrocephalus. Small vessel disease changes. CTA NECK No evidence of hemodynamically significant stenosis involving either carotid artery or vertebral artery. Lung parenchymal changes most notable posterior aspect right upper lobe where there is a 2 cm cavitary pleural-based lesion. CT of the chest recommended for further delineation. This can be performed without contrast as patient has had contrast for the current exam. CTA HEAD Calcified internal carotid artery cavernous segment with mild to moderate narrowing bilaterally. Mild to moderate narrowing supraclinoid aspect left internal carotid artery. No significant stenosis M1 segment of either middle cerebral artery. Middle cerebral artery mild to moderate branch vessel narrowing and irregularity bilaterally. Mild to moderate narrowing A1 segment left anterior cerebral artery. A2 segment anterior cerebral artery mild branch vessel irregularity bilaterally. Left vertebral artery is dominant. Mild tandem stenosis of the distal left vertebral artery. Mild narrowing distal right vertebral artery. Mild narrowing and irregularity basilar artery without high-grade stenosis. Moderate to marked narrowing P1 -2 junction posterior cerebral artery bilaterally. Mild to moderate narrowing distal posterior cerebral artery branches bilaterally. Electronically Signed   By: Lacy DuverneySteven  Olson M.D.   On: 07/14/2015 17:03   Dg Chest 2 View  07/14/2015  CLINICAL DATA:  Stroke. EXAM: CHEST  2 VIEW COMPARISON:  None. FINDINGS: The heart size and mediastinal contours are within normal limits. No pneumothorax or pleural effusion is noted. Hyperexpansion of the lungs is noted suggesting chronic obstructive  pulmonary disease. Atherosclerosis of thoracic aorta is noted. Left lung is clear. Ill-defined density is noted in right upper lobe which may represent inflammation, but there is noted nodular density peripherally in right upper lobe. The visualized skeletal structures are unremarkable. IMPRESSION: Ill-defined density seen in right upper lobe which may represent inflammation or possibly pneumonia. However, small nodular density is noted laterally in right upper lobe concerning for possible neoplasm. CT scan of the chest with intravenous contrast is recommended for further evaluation. Electronically Signed   By: Lupita RaiderJames  Green Jr, M.D.   On: 07/14/2015 16:55   Ct Head Wo Contrast  07/14/2015  CLINICAL DATA:  Severe headache. EXAM: CT HEAD WITHOUT CONTRAST TECHNIQUE: Contiguous axial images were obtained from the base of the skull through the vertex without intravenous contrast. COMPARISON:  None. FINDINGS: Bony calvarium appears intact. Mild diffuse cortical atrophy is noted. Mild chronic ischemic white matter disease is noted. No mass effect or midline shift is noted. Ventricular size is within normal limits. There is no evidence of mass lesion, hemorrhage or acute infarction. IMPRESSION: Mild diffuse cortical atrophy. Mild chronic ischemic white matter disease. No acute intracranial abnormality seen. Electronically Signed   By: Lupita RaiderJames  Green Jr, M.D.   On: 07/14/2015 13:15   Ct Angio Neck W/cm &/or Wo/cm  07/14/2015  CLINICAL DATA:  82 year old female with altered mental status and difficulty finding words. Headache. Subsequent encounter. EXAM: CT ANGIOGRAPHY HEAD AND NECK TECHNIQUE: Multidetector CT imaging of the head and neck was performed using the standard  protocol during bolus administration of intravenous contrast. Multiplanar CT image reconstructions and MIPs were obtained to evaluate the vascular anatomy. Carotid stenosis measurements (when applicable) are obtained utilizing NASCET criteria, using the  distal internal carotid diameter as the denominator. CONTRAST:  50 cc Isovue 370. COMPARISON:  07/14/2015 brain MR and head CT. FINDINGS: CT HEAD Brain: No intracranial hemorrhage or CT evidence of large acute infarct. No intracranial mass or abnormal enhancement. Global atrophy without hydrocephalus. Small vessel disease changes. Calvarium and skull base: Negative. Paranasal sinuses: Clear. Orbits: Negative. CTA NECK Aortic arch: 3 vessel arch. Right carotid system: Mild plaque right carotid bifurcation without significant narrowing. Left carotid system: Mild plaque origin left common carotid artery. Plaque proximal left internal carotid artery without significant stenosis. Vertebral arteries:No significant stenosis of the vertebral arteries. Skeleton: No osseous destructive lesion. Cervical spondylotic changes without high-grade spinal stenosis. Other neck: No worrisome primary neck mass. Upper chest: Lung parenchymal changes most notable posterior aspect right upper lobe with there is a 2 cm cavitary pleural-based lesion. CT of the chest recommended for further delineation. This can be performed without contrast as patient has had contrast for the current exam. CTA HEAD Anterior circulation: Calcified internal carotid artery cavernous segment with mild moderate narrowing bilaterally. Mild moderate narrowing supraclinoid aspect left internal carotid artery. No significant stenosis M1 segment of either middle cerebral artery. Middle cerebral artery mild to moderate branch vessel narrowing and irregularity. Mild moderate narrowing A1 segment left anterior cerebral artery. A2 segment anterior cerebral artery mild branch vessel irregularity bilaterally. Posterior circulation: Left vertebral artery is dominant. Mild tandem stenosis of the distal left vertebral artery. Mild narrowing distal right vertebral artery. Mild narrowing irregularity basilar artery without high-grade stenosis. Moderate to marked narrowing P1 -2  junction posterior cerebral artery bilaterally. Mild moderate narrowing distal posterior cerebral artery branches bilaterally. Venous sinuses: Patent. Anatomic variants: Negative. Delayed phase: Negative. IMPRESSION: CT HEAD No intracranial hemorrhage or CT evidence of large acute infarct. No intracranial mass or abnormal enhancement. Global atrophy without hydrocephalus. Small vessel disease changes. CTA NECK No evidence of hemodynamically significant stenosis involving either carotid artery or vertebral artery. Lung parenchymal changes most notable posterior aspect right upper lobe where there is a 2 cm cavitary pleural-based lesion. CT of the chest recommended for further delineation. This can be performed without contrast as patient has had contrast for the current exam. CTA HEAD Calcified internal carotid artery cavernous segment with mild to moderate narrowing bilaterally. Mild to moderate narrowing supraclinoid aspect left internal carotid artery. No significant stenosis M1 segment of either middle cerebral artery. Middle cerebral artery mild to moderate branch vessel narrowing and irregularity bilaterally. Mild to moderate narrowing A1 segment left anterior cerebral artery. A2 segment anterior cerebral artery mild branch vessel irregularity bilaterally. Left vertebral artery is dominant. Mild tandem stenosis of the distal left vertebral artery. Mild narrowing distal right vertebral artery. Mild narrowing and irregularity basilar artery without high-grade stenosis. Moderate to marked narrowing P1 -2 junction posterior cerebral artery bilaterally. Mild to moderate narrowing distal posterior cerebral artery branches bilaterally. Electronically Signed   By: Lacy Duverney M.D.   On: 07/14/2015 17:03   Mr Brain Wo Contrast  07/14/2015  CLINICAL DATA:  Stroke.  Headache. EXAM: MRI HEAD WITHOUT CONTRAST TECHNIQUE: Multiplanar, multiecho pulse sequences of the brain and surrounding structures were obtained without  intravenous contrast. COMPARISON:  CT 07/14/2015 FINDINGS: Negative for acute infarct. No significant chronic ischemia Mild atrophy.  Negative for hydrocephalus. Negative for intracranial hemorrhage.  No  fluid collection Negative for mass or edema.  No shift of the midline structures Pituitary normal.  Skull base normal. Mild mucosal edema paranasal sinuses.  Normal orbit bilaterally. IMPRESSION: Normal for age. Mild mucosal edema paranasal sinuses. Electronically Signed   By: Marlan Palau M.D.   On: 07/14/2015 16:41    EKG: Independently reviewed.Atrial fibrillation Anterior infarct, old Minimal ST depression, lateral leads  Assessment/Plan Principal Problem:   Left-sided weakness Active Problems:   Stroke (HCC)   A-fib (HCC)   Headache   Hypertension   Positive Lyme disease serology  1. Left-sided weakness/headache. CT of the head without acute abnormality. MRI of the brain negative for acute infarct, normal for age per report. CTA neck No evidence of hemodynamically significant stenosis involving either carotid artery or vertebral artery. CTA head with mild to moderate narrowing anteriorly and mild narrowing posteriorly.  Evaluated by neurology in the emergency department recommends admission for stroke workup. Patient outside TPA window. -Admit to telemetry -Imaging per neurology specifically CT angiogram of the head and neck -We'll also obtain carotid Dopplers 2-D echo chest x-ray per protocol -We'll obtain TSH hemoglobin A1c, lipid panel -We'll keep nothing by mouth until she passes a bedside swallow eval -Physical therapy/occupational therapy -Frequent neuro checks -Statin and aspirin  #2. A. fib. Reportedly new onset. EKG as noted above. Initial troponin negative. INR 1.07 [place] vasc score 3 -Aspirin for now -Cycle troponin -Repeat EKG in the morning -Obtain TSH  3. Hypertension. History of same. Blood pressure 172/93 on admission. -Monitor closely -Allow for permissive  hypertension -When necessary hydralazine  #4. Positive Lyme disease serology reportedly. Recent started on doxycycline and penicillin V. Daughter reports she is 2 weeks into a three-week regimen -We'll continue these antibiotics  kirkpatrik  Code Status: full DVT Prophylaxis: Family Communication:  Disposition Plan:  Time spent: 65 minutes  [facility]

## 2015-07-14 NOTE — ED Notes (Signed)
Patient transported to MRI 

## 2015-07-14 NOTE — Consult Note (Signed)
Requesting Physician: Dr. Jeraldine LootsLockwood    Chief Complaint: Stroke  History obtained from:  Patient    HPI:                                                                                                                                         Caitlyn Henry is an 82 y.o. female presenting to hospital after family noted she was not acting her normal self, having difficulty with finding words, and having a significant HA involving bilateral frontal region and back of her neck. Currently she has frontal HA but no neck pain after the Reglan. She is having difficulty expressing herself and answering questions. CT head showed no acute changes. While in ED she was found to be in new onset Afib.   Date last known well: 4.24.2017 Time last known well: Unable to determine tPA Given: no out of window     Past Medical History  Diagnosis Date  . Anemia     Past Surgical History  Procedure Laterality Date  . Abdominal hysterectomy    . Bladder suspension      History reviewed. No pertinent family history. Social History:  reports that she has never smoked. She does not have any smokeless tobacco history on file. She reports that she drinks alcohol. She reports that she does not use illicit drugs.  Allergies:  Allergies  Allergen Reactions  . Tylenol [Acetaminophen] Other (See Comments)    Makes my body feel numb     Medications:                                                                                                                           Current Facility-Administered Medications  Medication Dose Route Frequency Provider Last Rate Last Dose  . diphenhydrAMINE (BENADRYL) injection 12.5 mg  12.5 mg Intravenous Once PRN Shawn C Joy, PA-C      . iopamidol (ISOVUE-370) 76 % injection            Current Outpatient Prescriptions  Medication Sig Dispense Refill  . doxycycline (ADOXA) 50 MG tablet Take 50 mg by mouth 2 (two) times daily.  0  . ferrous gluconate (FERGON) 240 (27 FE) MG  tablet Take 240 mg by mouth daily.  6  . levothyroxine (SYNTHROID, LEVOTHROID) 50 MCG tablet Take 50 mcg by mouth daily.  12  . penicillin v potassium (  VEETID) 500 MG tablet Take 500 mg by mouth 2 (two) times daily.  0  . Vitamin D, Ergocalciferol, (DRISDOL) 50000 units CAPS capsule Take 50,000 Units by mouth once a week.  12     ROS:                                                                                                                                       History obtained from family  General ROS: negative for - chills, fatigue, fever, night sweats, weight gain or weight loss Psychological ROS: negative for - behavioral disorder, hallucinations, memory difficulties, mood swings or suicidal ideation Ophthalmic ROS: negative for - blurry vision, double vision, eye pain or loss of vision ENT ROS: negative for - epistaxis, nasal discharge, oral lesions, sore throat, tinnitus or vertigo Allergy and Immunology ROS: negative for - hives or itchy/watery eyes Hematological and Lymphatic ROS: negative for - bleeding problems, bruising or swollen lymph nodes Endocrine ROS: negative for - galactorrhea, hair pattern changes, polydipsia/polyuria or temperature intolerance Respiratory ROS: negative for - cough, hemoptysis, shortness of breath or wheezing Cardiovascular ROS: negative for - chest pain, dyspnea on exertion, edema or irregular heartbeat Gastrointestinal ROS: negative for - abdominal pain, diarrhea, hematemesis, nausea/vomiting or stool incontinence Genito-Urinary ROS: negative for - dysuria, hematuria, incontinence or urinary frequency/urgency Musculoskeletal ROS: negative for - joint swelling or muscular weakness Neurological ROS: as noted in HPI Dermatological ROS: negative for rash and skin lesion changes  Neurologic Examination:                                                                                                      Blood pressure 160/97, pulse 98, temperature 97.6  F (36.4 C), temperature source Oral, resp. rate 16, SpO2 97 %.  HEENT-  Normocephalic, no lesions, without obvious abnormality.  Normal external eye and conjunctiva.  Normal TM's bilaterally.  Normal auditory canals and external ears. Normal external nose, mucus membranes and septum.  Normal pharynx. Cardiovascular- regularly irregular rhythm, pulses palpable throughout   Lungs- chest clear, no wheezing, rales, normal symmetric air entry Abdomen- normal findings: bowel sounds normal Extremities- no edema Lymph-no adenopathy palpable Musculoskeletal-no joint tenderness, deformity or swelling Skin-warm and dry, no hyperpigmentation, vitiligo, or suspicious lesions  Neurological Examination Mental Status: Alert, oriented to hospital but having difficulty following commands and answering complex questions.  Speech fluent without evidence of aphasia.  Able to follow simple commands. Cranial Nerves: II: Visual fields grossly normal, pupils equal, round, reactive  to light and accommodation--neglects to the left on DSS III,IV, VI: ptosis not present, extra-ocular motions intact bilaterally V,VII: smile asymmetric on the left, facial light touch sensation normal bilaterally VIII: hearing normal bilaterally IX,X: uvula rises symmetrically XI: bilateral shoulder shrug XII: midline tongue extension Motor: Right : Upper extremity   5/5    Left:     Upper extremity   5/5  Lower extremity   5/5     Lower extremity   5/5 --drift on the left UE Tone and bulk:normal tone throughout; no atrophy noted Sensory: Pinprick and light touch intact throughout, bilaterally--neglects the left to DSS Deep Tendon Reflexes: 2+ and symmetric throughout Plantars: Right: downgoing   Left: downgoing Cerebellar: normal finger-to-nose and normal heel-to-shin test Gait: not tested       Lab Results: Basic Metabolic Panel:  Recent Labs Lab 07/14/15 1326  NA 134*  K 4.6  CL 98*  CO2 26  GLUCOSE 119*  BUN  19  CREATININE 0.92  CALCIUM 9.5    Liver Function Tests:  Recent Labs Lab 07/14/15 1326  AST 25  ALT 19  ALKPHOS 51  BILITOT 0.8  PROT 8.1  ALBUMIN 4.3   No results for input(s): LIPASE, AMYLASE in the last 168 hours. No results for input(s): AMMONIA in the last 168 hours.  CBC:  Recent Labs Lab 07/14/15 1326  WBC 7.6  NEUTROABS 5.4  HGB 13.5  HCT 41.0  MCV 89.3  PLT 214    Cardiac Enzymes: No results for input(s): CKTOTAL, CKMB, CKMBINDEX, TROPONINI in the last 168 hours.  Lipid Panel: No results for input(s): CHOL, TRIG, HDL, CHOLHDL, VLDL, LDLCALC in the last 168 hours.  CBG: No results for input(s): GLUCAP in the last 168 hours.  Microbiology: Results for orders placed or performed during the hospital encounter of 08/25/14  Urine culture     Status: None   Collection Time: 08/25/14  3:37 PM  Result Value Ref Range Status   Specimen Description URINE, RANDOM  Final   Special Requests NONE  Final   Colony Count   Final    >=100,000 COLONIES/ML Performed at [facility]    Culture   Final    ESCHERICHIA COLI Performed at [facility]    Report Status 08/28/2014 FINAL  Final   Organism ID, Bacteria ESCHERICHIA COLI  Final      Susceptibility   Escherichia coli - MIC*    AMPICILLIN <=2 SENSITIVE Sensitive     CEFAZOLIN <=4 SENSITIVE Sensitive     CEFTRIAXONE <=1 SENSITIVE Sensitive     CIPROFLOXACIN <=0.25 SENSITIVE Sensitive     GENTAMICIN <=1 SENSITIVE Sensitive     LEVOFLOXACIN <=0.12 SENSITIVE Sensitive     NITROFURANTOIN <=16 SENSITIVE Sensitive     TOBRAMYCIN <=1 SENSITIVE Sensitive     TRIMETH/SULFA <=20 SENSITIVE Sensitive     PIP/TAZO <=4 SENSITIVE Sensitive     * ESCHERICHIA COLI    Coagulation Studies:  Recent Labs  07/14/15 1325  LABPROT 14.1  INR 1.07    Imaging: Ct Head Wo Contrast  07/14/2015  CLINICAL DATA:  Severe headache. EXAM: CT HEAD WITHOUT CONTRAST TECHNIQUE: Contiguous axial images were  obtained from the base of the skull through the vertex without intravenous contrast. COMPARISON:  None. FINDINGS: Bony calvarium appears intact. Mild diffuse cortical atrophy is noted. Mild chronic ischemic white matter disease is noted. No mass effect or midline shift is noted. Ventricular size is within normal limits. There is no evidence of  mass lesion, hemorrhage or acute infarction. IMPRESSION: Mild diffuse cortical atrophy. Mild chronic ischemic white matter disease. No acute intracranial abnormality seen. Electronically Signed   By: Lupita Raider, M.D.   On: 07/14/2015 13:15       Assessment and plan discussed with with attending physician and they are in agreement.    Felicie Morn PA-C [facility] [phone]  07/14/2015, 2:51 PM   Assessment: 82 y.o. female with new onset Afib, HA, and neglect to DSS on the left. I suspect that she has had a stroke and will need to be worked up for such.  Likely due to afib.   Stroke Risk Factors - none  1. HgbA1c, fasting lipid panel 2. MRI of the brain without contrast 3. Frequent neuro checks 4. Echocardiogram 5. CTA head and neck.  6. Prophylactic therapy-Antiplatelet med: Aspirin - dose  PO or  PR 7. Risk factor modification 8. Telemetry monitoring 9. PT consult, OT consult, Speech consult 10. please page stroke NP  Or  PA  Or MD  M-F from 8am -4 pm starting 4/26 as this patient will be followed by the stroke team at this point.   You can look them up on [email or web address]    Ritta Slot, MD [facility] [phone]  If 7pm- 7am, please page neurology on call as listed in AMION.

## 2015-07-14 NOTE — ED Notes (Signed)
Pt is aware urine is needed for testing, pt is unable to urinate at this time.  

## 2015-07-14 NOTE — ED Notes (Signed)
Patient transported to CT 

## 2015-07-14 NOTE — Progress Notes (Signed)
Patient arrived to [id number] with family at bedside. Safety precautions and orders reviewed with patient/family. TELE applied and confirmed. NP/MD at bedside. Pt denied pain. No other distress noted. Will continue to monitor.   Hanan Mcwilliams. RN.

## 2015-07-15 ENCOUNTER — Observation Stay (HOSPITAL_COMMUNITY): Payer: Medicare Other

## 2015-07-15 ENCOUNTER — Observation Stay (HOSPITAL_BASED_OUTPATIENT_CLINIC_OR_DEPARTMENT_OTHER): Payer: Medicare Other

## 2015-07-15 ENCOUNTER — Encounter (HOSPITAL_COMMUNITY): Payer: Self-pay | Admitting: Radiology

## 2015-07-15 DIAGNOSIS — E039 Hypothyroidism, unspecified: Secondary | ICD-10-CM

## 2015-07-15 DIAGNOSIS — I10 Essential (primary) hypertension: Secondary | ICD-10-CM | POA: Diagnosis not present

## 2015-07-15 DIAGNOSIS — M6289 Other specified disorders of muscle: Secondary | ICD-10-CM

## 2015-07-15 DIAGNOSIS — I6789 Other cerebrovascular disease: Secondary | ICD-10-CM

## 2015-07-15 DIAGNOSIS — I4891 Unspecified atrial fibrillation: Secondary | ICD-10-CM | POA: Diagnosis not present

## 2015-07-15 DIAGNOSIS — I481 Persistent atrial fibrillation: Secondary | ICD-10-CM

## 2015-07-15 DIAGNOSIS — Z7189 Other specified counseling: Secondary | ICD-10-CM | POA: Diagnosis not present

## 2015-07-15 DIAGNOSIS — R531 Weakness: Secondary | ICD-10-CM | POA: Insufficient documentation

## 2015-07-15 LAB — URINE CULTURE

## 2015-07-15 LAB — ECHOCARDIOGRAM COMPLETE

## 2015-07-15 LAB — HEMOGLOBIN A1C
Hgb A1c MFr Bld: 6.3 % — ABNORMAL HIGH (ref 4.8–5.6)
MEAN PLASMA GLUCOSE: 134 mg/dL

## 2015-07-15 MED ORDER — APIXABAN 5 MG PO TABS: 5.0000 mg | ORAL_TABLET | Freq: Two times a day (BID) | ORAL | Status: DC

## 2015-07-15 MED ORDER — WARFARIN VIDEO
Freq: Once | Status: AC
  Administered 2015-07-15: 17:00:00

## 2015-07-15 MED ORDER — TRAMADOL HCL 50 MG PO TABS: 50.0000 mg | ORAL_TABLET | Freq: Four times a day (QID) | ORAL | Status: DC | PRN

## 2015-07-15 MED ORDER — ENOXAPARIN SODIUM 60 MG/0.6ML ~~LOC~~ SOLN
1.0000 mg/kg | Freq: Two times a day (BID) | SUBCUTANEOUS | Status: DC
Start: 2015-07-15 — End: 2015-07-15
  Filled 2015-07-15 (×2): qty 0.5

## 2015-07-15 MED ORDER — METOPROLOL TARTRATE 12.5 MG HALF TABLET
12.5000 mg | ORAL_TABLET | Freq: Two times a day (BID) | ORAL | Status: DC
  Administered 2015-07-15 – 2015-07-16 (×2): 12.5 mg via ORAL
  Filled 2015-07-15 (×2): qty 1

## 2015-07-15 MED ORDER — COUMADIN BOOK
Freq: Once | Status: AC
  Administered 2015-07-15: 17:00:00
  Filled 2015-07-15: qty 1

## 2015-07-15 MED ORDER — APIXABAN 2.5 MG PO TABS
2.5000 mg | ORAL_TABLET | Freq: Two times a day (BID) | ORAL | Status: DC
  Administered 2015-07-15 – 2015-07-17 (×4): 2.5 mg via ORAL
  Filled 2015-07-15 (×4): qty 1

## 2015-07-15 MED ORDER — WARFARIN SODIUM 5 MG PO TABS: 2.5000 mg | ORAL_TABLET | Freq: Once | ORAL | Status: DC

## 2015-07-15 MED ORDER — TRAMADOL HCL 50 MG PO TABS: 50.0000 mg | ORAL_TABLET | Freq: Once | ORAL | Status: DC

## 2015-07-15 MED ORDER — WARFARIN - PHARMACIST DOSING INPATIENT
Freq: Every day | Status: DC
  Administered 2015-07-15: 18:00:00

## 2015-07-15 MED ORDER — IOPAMIDOL (ISOVUE-370) INJECTION 76%
INTRAVENOUS | Status: AC
  Administered 2015-07-15: 80 mL
  Filled 2015-07-15: qty 100

## 2015-07-15 MED ORDER — DOXYCYCLINE HYCLATE 100 MG PO TABS
100.0000 mg | ORAL_TABLET | Freq: Two times a day (BID) | ORAL | Status: DC
  Administered 2015-07-15 – 2015-07-17 (×4): 100 mg via ORAL
  Filled 2015-07-15 (×4): qty 1

## 2015-07-15 NOTE — Progress Notes (Addendum)
[facility] PROGRESS NOTE  Hildred Alamin VWU:[id number] DOB: 10/07/1923 DOA: 07/14/2015   PCP: Aida Puffer, MD     Assessment/Plan: Principal Problem:   Left-sided weakness Active Problems:   Stroke (HCC)   A-fib (HCC)   Headache   Hypertension   Positive Lyme disease serology   Acute left-sided weakness    82 y.o. female with a Past Medical History of anemia, hypothyroid, CVA presenting to hospital after family noted she was not acting her normal self, having difficulty with finding words, and having a significant HA involving bilateral frontal region and back of her neck. Currently she has frontal HA but no neck pain after the Reglan. She is having difficulty expressing herself and answering questions. CT head showed no acute changes. While in ED she was found to be in new onset Afib. Patient admitted for   stroke like symptoms in the setting of new onset Afib.     Assessment and plan  Left-sided weakness/headache. CT of the head without acute abnormality. MRI of the brain negative for acute infarct, normal for age per report. CTA neck No evidence of hemodynamically significant stenosis involving either carotid artery or vertebral artery. CTA head with mild to moderate narrowing anteriorly and mild narrowing posteriorly. Evaluated by neurology in the emergency department recommends admission for stroke workup. Patient outside TPA window. Continue telemetry-continues to show atrial fibrillation Pending carotid Dopplers 2-D echo  chest x-ray shows possible pneumonia -right upper lobe density concerning for neoplasm, will obtain CT chest with contrast  TSH5.5,  hemoglobin A1c 6.3, lipid panel - LDL 125 she passed  bedside swallow eval -Physical therapy/occupational therapy -Frequent neuro checks -Statin and aspirin  New onset atrial fibrillation rate controlled. EKG as noted above. Initial troponin negative. INR 1.07 [place] vasc score 3 -Aspirin for now, 2-D echo  pending -Cycle troponin -Repeat EKG in the morning  Cardiology consultation for new onset A. fib, questionable need for TEE     Hypertension. History of same. Blood pressure 172/93 on admission. -Monitor closely -Allow for permissive hypertension -When necessary hydralazine     Positive Lyme disease serology reportedly. Recent started on doxycycline and penicillin V. Daughter reports she is 2 weeks into a three-week regimen -We'll continue these antibiotics  Right upper lobe nodule CT scan with contrast to rule out malignancy, pneumonia CT scan negative for pulmonary embolism patient has a cavitary lesion in the posterior right upper lobe, differential diagnosis could include Mycobacterium avium complex vs malignancy Discussed with Dr. Ninetta Lights, will send off to samples of expectorated sputum and check for MAC , family would not want a bronch  DVT prophylaxsis  Lovenox   Code Status:   full code      Family Communication: Discussed in detail with the patient, all imaging results, lab results explained to the patient   Disposition Plan:  Anticipate discharge in one to 2 days with home health      Consult  Neurology   Procedures-none  Antibiotics: Anti-infectives    Start     Dose/Rate Route Frequency Ordered Stop   07/14/15 2200  doxycycline (VIBRAMYCIN) 50 MG capsule 50 mg     50 mg Oral Every 12 hours 07/14/15 1706     07/14/15 2200  penicillin v potassium (VEETID) tablet 500 mg     500 mg Oral 2 times daily 07/14/15 1706           HPI/Subjective: Minimal dizziness and bilateral upper extremity weakness this morning, afebrile, soft  blood pressure intermittently  Objective: Filed Vitals:   07/14/15 2315 07/15/15 0100 07/15/15 0300 07/15/15 0500  BP: 126/69 74/58 114/70 114/70  Pulse: 77 82 79 79  Temp: 98.2 F (36.8 C) 98.8 F (37.1 C) 98.7 F (37.1 C) 98.7 F (37.1 C)  TempSrc: Oral Oral Oral Oral  Resp: 18 16 16 16   SpO2: 95% 94% 93% 93%     Intake/Output Summary (Last 24 hours) at 07/15/15 0856 Last data filed at 07/14/15 1859  Gross per 24 hour  Intake    360 ml  Output      0 ml  Net    360 ml    Exam:  Examination:  General exam: Appears calm and comfortable  Respiratory system: Clear to auscultation. Respiratory effort normal. Cardiovascular system: S1 & S2 heard, RRR. No JVD, murmurs, rubs, gallops or clicks. No pedal edema. Gastrointestinal system: Abdomen is nondistended, soft and nontender. No organomegaly or masses felt. Normal bowel sounds heard. Neurological: She is alert. She has normal reflexes.  Objective weakness in the left arm and left leg, rated 4 out of 5. Decreased sensation in the left arm and left leg as well. Patient is able to voice when her right leg or right arm is touched, however, when both right and left sides are touched and her eyes are closed, patient only voices that she feels the sensation on the right. No sensory deficits on the right. Strength 5 out of 5 on the right. Skin: No rashes, lesions or ulcers Psychiatry: Judgement and insight appear normal. Mood & affect appropriate.     Data Reviewed: I have personally reviewed following labs and imaging studies  Micro Results No results found for this or any previous visit (from the past 240 hour(s)).  Radiology Reports Ct Angio Head W/cm &/or Wo Cm  07/14/2015  CLINICAL DATA:  82 year old female with altered mental status and difficulty finding words. Headache. Subsequent encounter. EXAM: CT ANGIOGRAPHY HEAD AND NECK TECHNIQUE: Multidetector CT imaging of the head and neck was performed using the standard protocol during bolus administration of intravenous contrast. Multiplanar CT image reconstructions and MIPs were obtained to evaluate the vascular anatomy. Carotid stenosis measurements (when applicable) are obtained utilizing NASCET criteria, using the distal internal carotid diameter as the denominator. CONTRAST:  50 cc Isovue 370.  COMPARISON:  07/14/2015 brain MR and head CT. FINDINGS: CT HEAD Brain: No intracranial hemorrhage or CT evidence of large acute infarct. No intracranial mass or abnormal enhancement. Global atrophy without hydrocephalus. Small vessel disease changes. Calvarium and skull base: Negative. Paranasal sinuses: Clear. Orbits: Negative. CTA NECK Aortic arch: 3 vessel arch. Right carotid system: Mild plaque right carotid bifurcation without significant narrowing. Left carotid system: Mild plaque origin left common carotid artery. Plaque proximal left internal carotid artery without significant stenosis. Vertebral arteries:No significant stenosis of the vertebral arteries. Skeleton: No osseous destructive lesion. Cervical spondylotic changes without high-grade spinal stenosis. Other neck: No worrisome primary neck mass. Upper chest: Lung parenchymal changes most notable posterior aspect right upper lobe with there is a 2 cm cavitary pleural-based lesion. CT of the chest recommended for further delineation. This can be performed without contrast as patient has had contrast for the current exam. CTA HEAD Anterior circulation: Calcified internal carotid artery cavernous segment with mild moderate narrowing bilaterally. Mild moderate narrowing supraclinoid aspect left internal carotid artery. No significant stenosis M1 segment of either middle cerebral artery. Middle cerebral artery mild to moderate branch vessel narrowing and irregularity. Mild moderate narrowing A1  segment left anterior cerebral artery. A2 segment anterior cerebral artery mild branch vessel irregularity bilaterally. Posterior circulation: Left vertebral artery is dominant. Mild tandem stenosis of the distal left vertebral artery. Mild narrowing distal right vertebral artery. Mild narrowing irregularity basilar artery without high-grade stenosis. Moderate to marked narrowing P1 -2 junction posterior cerebral artery bilaterally. Mild moderate narrowing distal  posterior cerebral artery branches bilaterally. Venous sinuses: Patent. Anatomic variants: Negative. Delayed phase: Negative. IMPRESSION: CT HEAD No intracranial hemorrhage or CT evidence of large acute infarct. No intracranial mass or abnormal enhancement. Global atrophy without hydrocephalus. Small vessel disease changes. CTA NECK No evidence of hemodynamically significant stenosis involving either carotid artery or vertebral artery. Lung parenchymal changes most notable posterior aspect right upper lobe where there is a 2 cm cavitary pleural-based lesion. CT of the chest recommended for further delineation. This can be performed without contrast as patient has had contrast for the current exam. CTA HEAD Calcified internal carotid artery cavernous segment with mild to moderate narrowing bilaterally. Mild to moderate narrowing supraclinoid aspect left internal carotid artery. No significant stenosis M1 segment of either middle cerebral artery. Middle cerebral artery mild to moderate branch vessel narrowing and irregularity bilaterally. Mild to moderate narrowing A1 segment left anterior cerebral artery. A2 segment anterior cerebral artery mild branch vessel irregularity bilaterally. Left vertebral artery is dominant. Mild tandem stenosis of the distal left vertebral artery. Mild narrowing distal right vertebral artery. Mild narrowing and irregularity basilar artery without high-grade stenosis. Moderate to marked narrowing P1 -2 junction posterior cerebral artery bilaterally. Mild to moderate narrowing distal posterior cerebral artery branches bilaterally. Electronically Signed   By: Lacy Duverney M.D.   On: 07/14/2015 17:03   Dg Chest 2 View  07/14/2015  CLINICAL DATA:  Stroke. EXAM: CHEST  2 VIEW COMPARISON:  None. FINDINGS: The heart size and mediastinal contours are within normal limits. No pneumothorax or pleural effusion is noted. Hyperexpansion of the lungs is noted suggesting chronic obstructive pulmonary  disease. Atherosclerosis of thoracic aorta is noted. Left lung is clear. Ill-defined density is noted in right upper lobe which may represent inflammation, but there is noted nodular density peripherally in right upper lobe. The visualized skeletal structures are unremarkable. IMPRESSION: Ill-defined density seen in right upper lobe which may represent inflammation or possibly pneumonia. However, small nodular density is noted laterally in right upper lobe concerning for possible neoplasm. CT scan of the chest with intravenous contrast is recommended for further evaluation. Electronically Signed   By: Lupita Raider, M.D.   On: 07/14/2015 16:55   Ct Head Wo Contrast  07/14/2015  CLINICAL DATA:  Severe headache. EXAM: CT HEAD WITHOUT CONTRAST TECHNIQUE: Contiguous axial images were obtained from the base of the skull through the vertex without intravenous contrast. COMPARISON:  None. FINDINGS: Bony calvarium appears intact. Mild diffuse cortical atrophy is noted. Mild chronic ischemic white matter disease is noted. No mass effect or midline shift is noted. Ventricular size is within normal limits. There is no evidence of mass lesion, hemorrhage or acute infarction. IMPRESSION: Mild diffuse cortical atrophy. Mild chronic ischemic white matter disease. No acute intracranial abnormality seen. Electronically Signed   By: Lupita Raider, M.D.   On: 07/14/2015 13:15   Ct Angio Neck W/cm &/or Wo/cm  07/14/2015  CLINICAL DATA:  82 year old female with altered mental status and difficulty finding words. Headache. Subsequent encounter. EXAM: CT ANGIOGRAPHY HEAD AND NECK TECHNIQUE: Multidetector CT imaging of the head and neck was performed using the standard protocol  during bolus administration of intravenous contrast. Multiplanar CT image reconstructions and MIPs were obtained to evaluate the vascular anatomy. Carotid stenosis measurements (when applicable) are obtained utilizing NASCET criteria, using the distal  internal carotid diameter as the denominator. CONTRAST:  50 cc Isovue 370. COMPARISON:  07/14/2015 brain MR and head CT. FINDINGS: CT HEAD Brain: No intracranial hemorrhage or CT evidence of large acute infarct. No intracranial mass or abnormal enhancement. Global atrophy without hydrocephalus. Small vessel disease changes. Calvarium and skull base: Negative. Paranasal sinuses: Clear. Orbits: Negative. CTA NECK Aortic arch: 3 vessel arch. Right carotid system: Mild plaque right carotid bifurcation without significant narrowing. Left carotid system: Mild plaque origin left common carotid artery. Plaque proximal left internal carotid artery without significant stenosis. Vertebral arteries:No significant stenosis of the vertebral arteries. Skeleton: No osseous destructive lesion. Cervical spondylotic changes without high-grade spinal stenosis. Other neck: No worrisome primary neck mass. Upper chest: Lung parenchymal changes most notable posterior aspect right upper lobe with there is a 2 cm cavitary pleural-based lesion. CT of the chest recommended for further delineation. This can be performed without contrast as patient has had contrast for the current exam. CTA HEAD Anterior circulation: Calcified internal carotid artery cavernous segment with mild moderate narrowing bilaterally. Mild moderate narrowing supraclinoid aspect left internal carotid artery. No significant stenosis M1 segment of either middle cerebral artery. Middle cerebral artery mild to moderate branch vessel narrowing and irregularity. Mild moderate narrowing A1 segment left anterior cerebral artery. A2 segment anterior cerebral artery mild branch vessel irregularity bilaterally. Posterior circulation: Left vertebral artery is dominant. Mild tandem stenosis of the distal left vertebral artery. Mild narrowing distal right vertebral artery. Mild narrowing irregularity basilar artery without high-grade stenosis. Moderate to marked narrowing P1 -2  junction posterior cerebral artery bilaterally. Mild moderate narrowing distal posterior cerebral artery branches bilaterally. Venous sinuses: Patent. Anatomic variants: Negative. Delayed phase: Negative. IMPRESSION: CT HEAD No intracranial hemorrhage or CT evidence of large acute infarct. No intracranial mass or abnormal enhancement. Global atrophy without hydrocephalus. Small vessel disease changes. CTA NECK No evidence of hemodynamically significant stenosis involving either carotid artery or vertebral artery. Lung parenchymal changes most notable posterior aspect right upper lobe where there is a 2 cm cavitary pleural-based lesion. CT of the chest recommended for further delineation. This can be performed without contrast as patient has had contrast for the current exam. CTA HEAD Calcified internal carotid artery cavernous segment with mild to moderate narrowing bilaterally. Mild to moderate narrowing supraclinoid aspect left internal carotid artery. No significant stenosis M1 segment of either middle cerebral artery. Middle cerebral artery mild to moderate branch vessel narrowing and irregularity bilaterally. Mild to moderate narrowing A1 segment left anterior cerebral artery. A2 segment anterior cerebral artery mild branch vessel irregularity bilaterally. Left vertebral artery is dominant. Mild tandem stenosis of the distal left vertebral artery. Mild narrowing distal right vertebral artery. Mild narrowing and irregularity basilar artery without high-grade stenosis. Moderate to marked narrowing P1 -2 junction posterior cerebral artery bilaterally. Mild to moderate narrowing distal posterior cerebral artery branches bilaterally. Electronically Signed   By: Lacy Duverney M.D.   On: 07/14/2015 17:03   Mr Brain Wo Contrast  07/14/2015  CLINICAL DATA:  Stroke.  Headache. EXAM: MRI HEAD WITHOUT CONTRAST TECHNIQUE: Multiplanar, multiecho pulse sequences of the brain and surrounding structures were obtained without  intravenous contrast. COMPARISON:  CT 07/14/2015 FINDINGS: Negative for acute infarct. No significant chronic ischemia Mild atrophy.  Negative for hydrocephalus. Negative for intracranial hemorrhage.  No fluid  collection Negative for mass or edema.  No shift of the midline structures Pituitary normal.  Skull base normal. Mild mucosal edema paranasal sinuses.  Normal orbit bilaterally. IMPRESSION: Normal for age. Mild mucosal edema paranasal sinuses. Electronically Signed   By: Marlan Palau M.D.   On: 07/14/2015 16:41     CBC  Recent Labs Lab 07/14/15 1326  WBC 7.6  HGB 13.5  HCT 41.0  PLT 214  MCV 89.3  MCH 29.4  MCHC 32.9  RDW 13.9  LYMPHSABS 1.8  MONOABS 0.3  EOSABS 0.0  BASOSABS 0.0    Chemistries   Recent Labs Lab 07/14/15 1326  NA 134*  K 4.6  CL 98*  CO2 26  GLUCOSE 119*  BUN 19  CREATININE 0.92  CALCIUM 9.5  AST 25  ALT 19  ALKPHOS 51  BILITOT 0.8   ------------------------------------------------------------------------------------------------------------------ CrCl cannot be calculated (Unknown ideal weight.). ------------------------------------------------------------------------------------------------------------------  Recent Labs  07/14/15 1912  HGBA1C 6.3*   ------------------------------------------------------------------------------------------------------------------  Recent Labs  07/14/15 1912  CHOL 202*  HDL 63  LDLCALC 125*  TRIG 70  CHOLHDL 3.2   ------------------------------------------------------------------------------------------------------------------  Recent Labs  07/14/15 1912  TSH 5.526*   ------------------------------------------------------------------------------------------------------------------ No results for input(s): VITAMINB12, FOLATE, FERRITIN, TIBC, IRON, RETICCTPCT in the last 72 hours.  Coagulation profile  Recent Labs Lab 07/14/15 1325  INR 1.07    No results for input(s): DDIMER in the  last 72 hours.  Cardiac Enzymes  Recent Labs Lab 07/14/15 1912  TROPONINI <0.03   ------------------------------------------------------------------------------------------------------------------ Invalid input(s): POCBNP   CBG: No results for input(s): GLUCAP in the last 168 hours.     Studies: Ct Angio Head W/cm &/or Wo Cm  07/14/2015  CLINICAL DATA:  82 year old female with altered mental status and difficulty finding words. Headache. Subsequent encounter. EXAM: CT ANGIOGRAPHY HEAD AND NECK TECHNIQUE: Multidetector CT imaging of the head and neck was performed using the standard protocol during bolus administration of intravenous contrast. Multiplanar CT image reconstructions and MIPs were obtained to evaluate the vascular anatomy. Carotid stenosis measurements (when applicable) are obtained utilizing NASCET criteria, using the distal internal carotid diameter as the denominator. CONTRAST:  50 cc Isovue 370. COMPARISON:  07/14/2015 brain MR and head CT. FINDINGS: CT HEAD Brain: No intracranial hemorrhage or CT evidence of large acute infarct. No intracranial mass or abnormal enhancement. Global atrophy without hydrocephalus. Small vessel disease changes. Calvarium and skull base: Negative. Paranasal sinuses: Clear. Orbits: Negative. CTA NECK Aortic arch: 3 vessel arch. Right carotid system: Mild plaque right carotid bifurcation without significant narrowing. Left carotid system: Mild plaque origin left common carotid artery. Plaque proximal left internal carotid artery without significant stenosis. Vertebral arteries:No significant stenosis of the vertebral arteries. Skeleton: No osseous destructive lesion. Cervical spondylotic changes without high-grade spinal stenosis. Other neck: No worrisome primary neck mass. Upper chest: Lung parenchymal changes most notable posterior aspect right upper lobe with there is a 2 cm cavitary pleural-based lesion. CT of the chest recommended for further  delineation. This can be performed without contrast as patient has had contrast for the current exam. CTA HEAD Anterior circulation: Calcified internal carotid artery cavernous segment with mild moderate narrowing bilaterally. Mild moderate narrowing supraclinoid aspect left internal carotid artery. No significant stenosis M1 segment of either middle cerebral artery. Middle cerebral artery mild to moderate branch vessel narrowing and irregularity. Mild moderate narrowing A1 segment left anterior cerebral artery. A2 segment anterior cerebral artery mild branch vessel irregularity bilaterally. Posterior circulation: Left vertebral artery is dominant. Mild tandem  stenosis of the distal left vertebral artery. Mild narrowing distal right vertebral artery. Mild narrowing irregularity basilar artery without high-grade stenosis. Moderate to marked narrowing P1 -2 junction posterior cerebral artery bilaterally. Mild moderate narrowing distal posterior cerebral artery branches bilaterally. Venous sinuses: Patent. Anatomic variants: Negative. Delayed phase: Negative. IMPRESSION: CT HEAD No intracranial hemorrhage or CT evidence of large acute infarct. No intracranial mass or abnormal enhancement. Global atrophy without hydrocephalus. Small vessel disease changes. CTA NECK No evidence of hemodynamically significant stenosis involving either carotid artery or vertebral artery. Lung parenchymal changes most notable posterior aspect right upper lobe where there is a 2 cm cavitary pleural-based lesion. CT of the chest recommended for further delineation. This can be performed without contrast as patient has had contrast for the current exam. CTA HEAD Calcified internal carotid artery cavernous segment with mild to moderate narrowing bilaterally. Mild to moderate narrowing supraclinoid aspect left internal carotid artery. No significant stenosis M1 segment of either middle cerebral artery. Middle cerebral artery mild to moderate  branch vessel narrowing and irregularity bilaterally. Mild to moderate narrowing A1 segment left anterior cerebral artery. A2 segment anterior cerebral artery mild branch vessel irregularity bilaterally. Left vertebral artery is dominant. Mild tandem stenosis of the distal left vertebral artery. Mild narrowing distal right vertebral artery. Mild narrowing and irregularity basilar artery without high-grade stenosis. Moderate to marked narrowing P1 -2 junction posterior cerebral artery bilaterally. Mild to moderate narrowing distal posterior cerebral artery branches bilaterally. Electronically Signed   By: Lacy DuverneySteven  Olson M.D.   On: 07/14/2015 17:03   Dg Chest 2 View  07/14/2015  CLINICAL DATA:  Stroke. EXAM: CHEST  2 VIEW COMPARISON:  None. FINDINGS: The heart size and mediastinal contours are within normal limits. No pneumothorax or pleural effusion is noted. Hyperexpansion of the lungs is noted suggesting chronic obstructive pulmonary disease. Atherosclerosis of thoracic aorta is noted. Left lung is clear. Ill-defined density is noted in right upper lobe which may represent inflammation, but there is noted nodular density peripherally in right upper lobe. The visualized skeletal structures are unremarkable. IMPRESSION: Ill-defined density seen in right upper lobe which may represent inflammation or possibly pneumonia. However, small nodular density is noted laterally in right upper lobe concerning for possible neoplasm. CT scan of the chest with intravenous contrast is recommended for further evaluation. Electronically Signed   By: Lupita RaiderJames  Green Jr, M.D.   On: 07/14/2015 16:55   Ct Head Wo Contrast  07/14/2015  CLINICAL DATA:  Severe headache. EXAM: CT HEAD WITHOUT CONTRAST TECHNIQUE: Contiguous axial images were obtained from the base of the skull through the vertex without intravenous contrast. COMPARISON:  None. FINDINGS: Bony calvarium appears intact. Mild diffuse cortical atrophy is noted. Mild chronic  ischemic white matter disease is noted. No mass effect or midline shift is noted. Ventricular size is within normal limits. There is no evidence of mass lesion, hemorrhage or acute infarction. IMPRESSION: Mild diffuse cortical atrophy. Mild chronic ischemic white matter disease. No acute intracranial abnormality seen. Electronically Signed   By: Lupita RaiderJames  Green Jr, M.D.   On: 07/14/2015 13:15   Ct Angio Neck W/cm &/or Wo/cm  07/14/2015  CLINICAL DATA:  82 year old female with altered mental status and difficulty finding words. Headache. Subsequent encounter. EXAM: CT ANGIOGRAPHY HEAD AND NECK TECHNIQUE: Multidetector CT imaging of the head and neck was performed using the standard protocol during bolus administration of intravenous contrast. Multiplanar CT image reconstructions and MIPs were obtained to evaluate the vascular anatomy. Carotid stenosis measurements (when applicable)  are obtained utilizing NASCET criteria, using the distal internal carotid diameter as the denominator. CONTRAST:  50 cc Isovue 370. COMPARISON:  07/14/2015 brain MR and head CT. FINDINGS: CT HEAD Brain: No intracranial hemorrhage or CT evidence of large acute infarct. No intracranial mass or abnormal enhancement. Global atrophy without hydrocephalus. Small vessel disease changes. Calvarium and skull base: Negative. Paranasal sinuses: Clear. Orbits: Negative. CTA NECK Aortic arch: 3 vessel arch. Right carotid system: Mild plaque right carotid bifurcation without significant narrowing. Left carotid system: Mild plaque origin left common carotid artery. Plaque proximal left internal carotid artery without significant stenosis. Vertebral arteries:No significant stenosis of the vertebral arteries. Skeleton: No osseous destructive lesion. Cervical spondylotic changes without high-grade spinal stenosis. Other neck: No worrisome primary neck mass. Upper chest: Lung parenchymal changes most notable posterior aspect right upper lobe with there is a  2 cm cavitary pleural-based lesion. CT of the chest recommended for further delineation. This can be performed without contrast as patient has had contrast for the current exam. CTA HEAD Anterior circulation: Calcified internal carotid artery cavernous segment with mild moderate narrowing bilaterally. Mild moderate narrowing supraclinoid aspect left internal carotid artery. No significant stenosis M1 segment of either middle cerebral artery. Middle cerebral artery mild to moderate branch vessel narrowing and irregularity. Mild moderate narrowing A1 segment left anterior cerebral artery. A2 segment anterior cerebral artery mild branch vessel irregularity bilaterally. Posterior circulation: Left vertebral artery is dominant. Mild tandem stenosis of the distal left vertebral artery. Mild narrowing distal right vertebral artery. Mild narrowing irregularity basilar artery without high-grade stenosis. Moderate to marked narrowing P1 -2 junction posterior cerebral artery bilaterally. Mild moderate narrowing distal posterior cerebral artery branches bilaterally. Venous sinuses: Patent. Anatomic variants: Negative. Delayed phase: Negative. IMPRESSION: CT HEAD No intracranial hemorrhage or CT evidence of large acute infarct. No intracranial mass or abnormal enhancement. Global atrophy without hydrocephalus. Small vessel disease changes. CTA NECK No evidence of hemodynamically significant stenosis involving either carotid artery or vertebral artery. Lung parenchymal changes most notable posterior aspect right upper lobe where there is a 2 cm cavitary pleural-based lesion. CT of the chest recommended for further delineation. This can be performed without contrast as patient has had contrast for the current exam. CTA HEAD Calcified internal carotid artery cavernous segment with mild to moderate narrowing bilaterally. Mild to moderate narrowing supraclinoid aspect left internal carotid artery. No significant stenosis M1 segment  of either middle cerebral artery. Middle cerebral artery mild to moderate branch vessel narrowing and irregularity bilaterally. Mild to moderate narrowing A1 segment left anterior cerebral artery. A2 segment anterior cerebral artery mild branch vessel irregularity bilaterally. Left vertebral artery is dominant. Mild tandem stenosis of the distal left vertebral artery. Mild narrowing distal right vertebral artery. Mild narrowing and irregularity basilar artery without high-grade stenosis. Moderate to marked narrowing P1 -2 junction posterior cerebral artery bilaterally. Mild to moderate narrowing distal posterior cerebral artery branches bilaterally. Electronically Signed   By: Lacy Duverney M.D.   On: 07/14/2015 17:03   Mr Brain Wo Contrast  07/14/2015  CLINICAL DATA:  Stroke.  Headache. EXAM: MRI HEAD WITHOUT CONTRAST TECHNIQUE: Multiplanar, multiecho pulse sequences of the brain and surrounding structures were obtained without intravenous contrast. COMPARISON:  CT 07/14/2015 FINDINGS: Negative for acute infarct. No significant chronic ischemia Mild atrophy.  Negative for hydrocephalus. Negative for intracranial hemorrhage.  No fluid collection Negative for mass or edema.  No shift of the midline structures Pituitary normal.  Skull base normal. Mild mucosal edema paranasal sinuses.  Normal orbit bilaterally. IMPRESSION: Normal for age. Mild mucosal edema paranasal sinuses. Electronically Signed   By: Marlan Palau M.D.   On: 07/14/2015 16:41      Lab Results  Component Value Date   HGBA1C 6.3* 07/14/2015   Lab Results  Component Value Date   LDLCALC 125* 07/14/2015   CREATININE 0.92 07/14/2015       Scheduled Meds: . aspirin  325 mg Oral Daily  . doxycycline  50 mg Oral Q12H  . enoxaparin (LOVENOX) injection  40 mg Subcutaneous Q24H  . levothyroxine  50 mcg Oral QAC breakfast  . penicillin v potassium  500 mg Oral BID   Continuous Infusions:       Time spent: >30 MINS     [facility]  [facility] Pager [phone]. If 7PM-7AM, please contact night-coverage at [email or web address], password [place] 07/15/2015, 8:56 AM

## 2015-07-15 NOTE — Progress Notes (Signed)
  Echocardiogram 2D Echocardiogram has been performed.  Caitlyn SavoyCasey N Kambrey Henry 07/15/2015, 1:04 PM

## 2015-07-15 NOTE — Evaluation (Signed)
Physical Therapy Evaluation Patient Details Name: Caitlyn Henry MRN: [id number] DOB: 11-May-1923 Today's Date: 07/15/2015   History of Present Illness  Patient is a 82 y/o female with hx of anemia and stroke presents with left sided weakness, HA and word finding. Head MRI and CT negative. New onset A-fib.  Clinical Impression  Patient presents with generalized weakness BLEs, dizziness and balance deficits impacting mobility. Reports dizziness has been going on for a few weeks but difficult to assess as pt not a great historian. Would not hurt to have a vestibular eval to rule out any inner ear dysfunction. Recommend use of RW at home for support especially when feeling dizzy. Pt independent PTA. Will follow acutely to maximize independence and mobility prior to return home.    Follow Up Recommendations Home health PT;Supervision for mobility/OOB    Equipment Recommendations  None recommended by PT    Recommendations for Other Services OT consult     Precautions / Restrictions Precautions Precautions: Fall Precaution Comments: dizziness Restrictions Weight Bearing Restrictions: No      Mobility  Bed Mobility               General bed mobility comments: Up in chair upon PT arrival.   Transfers Overall transfer level: Needs assistance Equipment used: None Transfers: Sit to/from Stand Sit to Stand: Min guard         General transfer comment: Unsteady upon standing. + dizziness. Min guard for safety. Stood from Landscape architectchair x2.   Ambulation/Gait Ambulation/Gait assistance: Min assist Ambulation Distance (Feet): 250 Feet Assistive device: Rolling walker (2 wheeled) Gait Pattern/deviations: Step-through pattern;Decreased stride length;Narrow base of support;Scissoring;Drifts right/left Gait velocity: decreased   General Gait Details: Slow, mildly unsteady gait. Cues for RW management. Difficulty with turns. Imbalance. No overt LOB.   Stairs            Wheelchair  Mobility    Modified Rankin (Stroke Patients Only) Modified Rankin (Stroke Patients Only) Pre-Morbid Rankin Score: Slight disability Modified Rankin: Moderately severe disability     Balance Overall balance assessment: Needs assistance Sitting-balance support: Feet supported;No upper extremity supported Sitting balance-Leahy Scale: Fair     Standing balance support: During functional activity Standing balance-Leahy Scale: Fair Standing balance comment: Unsteady without UE support.                              Pertinent Vitals/Pain Pain Assessment: No/denies pain    Home Living Family/patient expects to be discharged to:: Private residence Living Arrangements: Spouse/significant other;Children (elderly spouse, son works) Available Help at Discharge: Family;Available PRN/intermittently Type of Home: House Home Access: Stairs to enter Entrance Stairs-Rails: Right Entrance Stairs-Number of Steps: 5 Home Layout: One level Home Equipment: Walker - 2 wheels      Prior Function Level of Independence: Independent         Comments: Reports no falls. Does not drive anymore. Recently diagnosed with Lymes disease and has been having dizziness for the last few weeks.     Hand Dominance        Extremity/Trunk Assessment   Upper Extremity Assessment: Defer to OT evaluation           Lower Extremity Assessment: Generalized weakness         Communication      Cognition Arousal/Alertness: Awake/alert Behavior During Therapy: WFL for tasks assessed/performed Overall Cognitive Status: Difficult to assess (Speech is tangential at times. Poor historian when trying to describe symptoms.) Area  of Impairment: Safety/judgement;Problem solving;Following commands     Memory: Decreased short-term memory Following Commands: Follows one step commands with increased time Safety/Judgement: Decreased awareness of safety          General Comments General comments  (skin integrity, edema, etc.): Daughter present during session. Pt with difficulty visually tracking in all directions, possible nystagmus noted but difficult to accurately assess. Pt not a great historian when reporting symptoms. Reports dizziness is pretty much all day but then says it comes and goes. Reports some tinnitus onlty when she has sinus problems. ???    Exercises        Assessment/Plan    PT Assessment Patient needs continued PT services  PT Diagnosis Difficulty walking;Generalized weakness   PT Problem List Decreased strength;Decreased cognition;Decreased activity tolerance;Decreased balance;Decreased mobility;Decreased knowledge of precautions;Decreased safety awareness;Decreased knowledge of use of DME  PT Treatment Interventions Balance training;Gait training;Stair training;Functional mobility training;Therapeutic activities;Therapeutic exercise;Patient/family education;DME instruction   PT Goals (Current goals can be found in the Care Plan section) Acute Rehab PT Goals Patient Stated Goal: to feel better PT Goal Formulation: With patient Time For Goal Achievement: 07/29/15 Potential to Achieve Goals: Good    Frequency Min 3X/week   Barriers to discharge Decreased caregiver support 5 steps to enter home and no support as son works and spouse cannot assist.    Co-evaluation               End of Session Equipment Utilized During Treatment: Gait belt Activity Tolerance: Patient tolerated treatment well;Patient limited by fatigue Patient left: in chair;with call bell/phone within reach;with family/visitor present Nurse Communication: Mobility status    Functional Assessment Tool Used: clinical judgment Functional Limitation: Mobility: Walking and moving around Mobility: Walking and Moving Around Current Status [phone]): At least 20 percent but less than 40 percent impaired, limited or restricted Mobility: Walking and Moving Around Goal Status [phone]): At least  1 percent but less than 20 percent impaired, limited or restricted    Time: 2130-8657 PT Time Calculation (min) (ACUTE ONLY): 23 min   Charges:   PT Evaluation $PT Eval Moderate Complexity: 1 Procedure PT Treatments $Gait Training: 8-22 mins   PT G Codes:   PT G-Codes **NOT FOR INPATIENT CLASS** Functional Assessment Tool Used: clinical judgment Functional Limitation: Mobility: Walking and moving around Mobility: Walking and Moving Around Current Status (Q4696): At least 20 percent but less than 40 percent impaired, limited or restricted Mobility: Walking and Moving Around Goal Status [phone]): At least 1 percent but less than 20 percent impaired, limited or restricted    Barak Bialecki A Galadriel Shroff 07/15/2015, 10:27 AM Mylo Red, PT, DPT [phone]

## 2015-07-15 NOTE — Care Management Note (Signed)
Case Management Note  Patient Details  Name: Caitlyn AlaminVirginia Henry MRN: [id number] Date of Birth: 1923/03/26  Subjective/Objective:      Pt in with TIA. MRI results negative.               Action/Plan: Awaiting PT/OT recs. CM following for discharge disposition.   Expected Discharge Date:                  Expected Discharge Plan:     In-House Referral:     Discharge planning Services     Post Acute Care Choice:    Choice offered to:     DME Arranged:    DME Agency:     HH Arranged:    HH Agency:     Status of Service:  In process, will continue to follow  Medicare Important Message Given:    Date Medicare IM Given:    Medicare IM give by:    Date Additional Medicare IM Given:    Additional Medicare Important Message give by:     If discussed at Long Length of Stay Meetings, dates discussed:    Additional Comments:  Kermit BaloKelli F Zong Mcquarrie, RN 07/15/2015, 12:51 PM

## 2015-07-15 NOTE — Discharge Instructions (Signed)

## 2015-07-15 NOTE — Evaluation (Signed)
Occupational Therapy Evaluation Patient Details Name: Caitlyn AlaminVirginia Henry MRN: [id number] DOB: 04-28-1923 Today's Date: 07/15/2015    History of Present Illness Patient is a 82 y/o female with hx of anemia and stroke presents with left sided weakness, HA and word finding. Head MRI and CT negative. New onset A-fib.   Clinical Impression   Pt reports she was independent with ADLs and mobility PTA. Currently pt is overall min guard for safety with ADLs and functional mobility. Pt reports dizziness feels better this PM with functional mobility in room. Difficult to perform formal visual testing secondary to pt being a poor historian and difficulty following commands. Pt noted to have decreased L fine motor coordination and R hand strength. Pt planning to d/c home with 24/7 supervision from family. Pt would benefit from continued skilled OT to address established goals.    Follow Up Recommendations  No OT follow up;Supervision - Intermittent    Equipment Recommendations  None recommended by OT    Recommendations for Other Services       Precautions / Restrictions Precautions Precautions: Fall Restrictions Weight Bearing Restrictions: No      Mobility Bed Mobility Overal bed mobility: Needs Assistance Bed Mobility: Supine to Sit     Supine to sit: Min guard;HOB elevated     General bed mobility comments: Min guard for safety and balance when coming from supine > sit.   Transfers Overall transfer level: Needs assistance Equipment used: Rolling walker (2 wheeled) Transfers: Sit to/from Stand Sit to Stand: Min guard         General transfer comment: Min guard for safety; no physical assist required. VCs for hand placement. Sit to stand from EOB x 1, BSC x 1.    Balance Overall balance assessment: Needs assistance Sitting-balance support: Feet supported;No upper extremity supported Sitting balance-Leahy Scale: Good     Standing balance support: No upper extremity  supported;During functional activity Standing balance-Leahy Scale: Fair                              ADL Overall ADL's : Needs assistance/impaired Eating/Feeding: Set up;Sitting   Grooming: Min guard;Standing;Wash/dry hands;Oral care   Upper Body Bathing: Supervision/ safety;Set up;Sitting   Lower Body Bathing: Min guard;Sit to/from stand   Upper Body Dressing : Supervision/safety;Set up;Sitting   Lower Body Dressing: Min guard;Sit to/from stand Lower Body Dressing Details (indicate cue type and reason): Pt able to pull up bil socks sitting EOB Toilet Transfer: Min guard;Ambulation;BSC;RW;Cueing for safety (BSC over toilet)   Toileting- Clothing Manipulation and Hygiene: Min guard;Sit to/from stand       Functional mobility during ADLs: Min guard;Rolling walker General ADL Comments: Pt reports dizziness is beginning to resolve; no dizziness reported with functional mobility in room this session. Pt is a poor historian therefore visual assessment was difficult to perform.      Vision Vision Assessment?: Vision impaired- to be further tested in functional context   Perception     Praxis      Pertinent Vitals/Pain Pain Assessment: No/denies pain     Hand Dominance Right   Extremity/Trunk Assessment Upper Extremity Assessment Upper Extremity Assessment: RUE deficits/detail;LUE deficits/detail RUE Deficits / Details: Decreased grip strengh <4. Grossly 4/5. LUE Deficits / Details: Decreased fine motor coordiantion. Grossly 4/5. LUE Coordination: decreased fine motor   Lower Extremity Assessment Lower Extremity Assessment: Defer to PT evaluation   Cervical / Trunk Assessment Cervical / Trunk Assessment: Normal  Communication Communication Communication: No difficulties   Cognition Arousal/Alertness: Awake/alert Behavior During Therapy: WFL for tasks assessed/performed Overall Cognitive Status: Difficult to assess Area of Impairment:  Safety/judgement;Following commands;Problem solving     Memory: Decreased short-term memory Following Commands: Follows one step commands with increased time;Follows one step commands inconsistently Safety/Judgement: Decreased awareness of safety   Problem Solving: Slow processing;Requires verbal cues     General Comments       Exercises       Shoulder Instructions      Home Living Family/patient expects to be discharged to:: Private residence Living Arrangements: Spouse/significant other;Children Available Help at Discharge: Family;Available 24 hours/day Type of Home: House Home Access: Stairs to enter [employer] of Steps: 5 Entrance Stairs-Rails: Right Home Layout: Two level;Able to live on main level with bedroom/bathroom     Bathroom Shower/Tub: Walk-in shower   Bathroom Toilet: Handicapped height     Home Equipment: Environmental consultant - 2 wheels;Shower seat - built in;Grab bars - toilet;Grab bars - tub/shower          Prior Functioning/Environment Level of Independence: Independent        Comments: Reports no falls. Does not drive anymore. Recently diagnosed with Lymes disease and has been having dizziness for the last few weeks.    OT Diagnosis: Generalized weakness;Disturbance of vision   OT Problem List: Decreased strength;Impaired balance (sitting and/or standing);Impaired vision/perception;Decreased coordination;Decreased safety awareness;Decreased knowledge of use of DME or AE;Decreased knowledge of precautions   OT Treatment/Interventions: Self-care/ADL training;Therapeutic exercise;Energy conservation;DME and/or AE instruction;Therapeutic activities;Patient/family education;Balance training    OT Goals(Current goals can be found in the care plan section) Acute Rehab OT Goals Patient Stated Goal: to feel better OT Goal Formulation: With patient Time For Goal Achievement: 07/29/15 Potential to Achieve Goals: Good ADL Goals Pt Will Perform  Grooming: with modified independence;standing Pt Will Perform Upper Body Bathing: with modified independence;sitting Pt Will Perform Lower Body Bathing: with modified independence;sit to/from stand Pt Will Transfer to Toilet: with modified independence;ambulating;regular height toilet;grab bars Pt Will Perform Toileting - Clothing Manipulation and hygiene: with modified independence;sit to/from stand Pt Will Perform Tub/Shower Transfer: Shower transfer;with supervision;ambulating;shower seat;rolling walker;grab bars Pt/caregiver will Perform Home Exercise Program: Increased strength;With theraputty;With written HEP provided;With Supervision;Both right and left upper extremity (increase fine motor coordination)  OT Frequency: Min 2X/week   Barriers to D/C:            Co-evaluation              End of Session Equipment Utilized During Treatment: Gait belt;Rolling walker  Activity Tolerance: Patient tolerated treatment well Patient left: in chair;with call bell/phone within reach;with family/visitor present   Time: 5621-3086 OT Time Calculation (min): 30 min Charges:  OT General Charges $OT Visit: 1 Procedure OT Evaluation $OT Eval Moderate Complexity: 1 Procedure OT Treatments $Self Care/Home Management : 8-22 mins G-Codes: OT G-codes **NOT FOR INPATIENT CLASS** Functional Assessment Tool Used: Clinical judgement Functional Limitation: Self care Self Care Current Status (V7846): At least 1 percent but less than 20 percent impaired, limited or restricted Self Care Goal Status (N6295): At least 1 percent but less than 20 percent impaired, limited or restricted   Gaye Alken M.S., OTR/L Pager: [phone]  07/15/2015, 3:20 PM

## 2015-07-15 NOTE — Progress Notes (Signed)
Subjective: All symptoms have cleared. patient is feeling much improved.   Exam: Filed Vitals:   07/15/15 0300 07/15/15 0500  BP: 114/70 114/70  Pulse: 79 79  Temp: 98.7 F (37.1 C) 98.7 F (37.1 C)  Resp: 16 16   Gen: In bed, NAD MS: alert, oriented, follows commands, speech clear.  CN: PERRLA, EOMI, TML, Face symmetric.  Motor: full 5/5 Sensory: grossely intact with no neglect  Pertinent Labs: none  Felicie MornDavid Smith PA-C [facility] [phone]  Impression: 82 yo F with likely TIA given her symptoms fully resolved and MRI negative for Acute stroke. CTA shows no large vessel occlusion.    Recommendations: 1) LDL 125, start statin 2) Agree with anticoagulation per cardiology 3) No further recommendations at this time. I strongly suspect afib as the etiology and her symptoms have resolved. Defer furthe rafib management to cardiology.   Neuro to sign off, please call with further questions or concerns.    Ritta SlotMcNeill Estie Sproule, MD [facility] [phone]  If 7pm- 7am, please page neurology on call as listed in AMION.  07/15/2015, 9:30 AM

## 2015-07-15 NOTE — Consult Note (Addendum)
Cardiology Consult    Patient ID: Caitlyn Henry MRN: [id number], DOB/AGE: [birth year]   Admit date: 07/14/2015 Date of Consult: 07/15/2015  Primary Physician: Aida Puffer, MD Reason for Consult: Atrial Fibrillation Primary Cardiologist: New to [facility] Requesting Provider: Dr. Susie Cassette  History of Present Illness    Caitlyn Henry is a 82 y.o. female with past medical history of anemia, hypothyroidism, and recently diagnosed Lyme Disease who presented to [facility] ED on 07/14/2015 for evaluation of a new onset headache, nausea, and altered mental status.   CT Head showed mild diffuse cortical atrophy and chronic ischemic white matter disease but no acute intracranial abnormalities were noted. MRI Brain showed no evidence of acute infarct. Her symptoms have resolved within 24 hours, so TIA is now the working diagnosis of her symptoms with negative imaging.  On arrival, she was noted to be in atrial fibrillation with a HR of 93. In reviewing telemetry, she has remained in atrial fibrillation this admission, rate-controlled in the 70's - 80's. WBC normal at 7.6. Hgb 13.5. Platelets 214. Creatinine 0.92. Troponin negative. Lipid panel showed LDL elevated to 125, total of 202. TSH elevated to 5.526.   An echocardiogram was performed today which showed moderate focal basal hypertrophy of the septum, an EF of 65% to 70%, normal wall motion, mild AS, and mild MR. The left atrium was moderately dilated.   She denies any prior cardiac history. Not aware of having an irregular heart rate. Reports having more fatigue over the past year but denies any chest pain, palpitations, or dyspnea with exertion. 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    Past Medical History   Past Medical History  Diagnosis Date  . Anemia   . Hypothyroid   . Stroke [facility])     Past Surgical History  Procedure Laterality Date  . Abdominal hysterectomy    . Bladder  suspension       Allergies  Allergies  Allergen Reactions  . Tylenol [Acetaminophen] Other (See Comments)    Makes my body feel numb     Inpatient Medications    . aspirin  325 mg Oral Daily  . doxycycline  50 mg Oral Q12H  . enoxaparin (LOVENOX) injection  40 mg Subcutaneous Q24H  . levothyroxine  50 mcg Oral QAC breakfast  . penicillin v potassium  500 mg Oral BID    Family History    Family History  Problem Relation Age of Onset  . Heart attack Cousin     Social History    Social History   Social History  . Marital Status: Married    Spouse Name: N/A  . Number of Children: N/A  . Years of Education: N/A   Occupational History  . Not on file.   Social History Main Topics  . Smoking status: Never Smoker   . Smokeless tobacco: Not on file  . Alcohol Use: Yes     Comment: rarely  . Drug Use: No  . Sexual Activity: Not on file   Other Topics Concern  . Not on file   Social History Narrative     Review of Systems    General:  No chills, fever, night sweats or weight changes.  Cardiovascular:  No chest pain, dyspnea on exertion, edema, orthopnea, palpitations, paroxysmal nocturnal dyspnea. Dermatological: No rash, lesions/masses Respiratory: No cough, dyspnea Urologic: No hematuria, dysuria Abdominal:   No vomiting, diarrhea, bright red blood per rectum, melena, or  hematemesis. Positive for nausea. Neurologic:  No visual changes, wkns, Positive for headaches and changes in mental status. All other systems reviewed and are otherwise negative except as noted above.  Physical Exam    Blood pressure 161/87, pulse 83, temperature 97.5 F (36.4 C), temperature source Oral, resp. rate 20, SpO2 97 %.  General: Pleasant, elderly Caucasian female appearing in NAD Psych: Normal affect. Neuro: Alert and oriented X 3. Moves all extremities spontaneously. HEENT: Normal  Neck: Supple without bruits or JVD. Lungs:  Resp regular and unlabored, CTA without  wheezing or rales Heart: Irregularly irregular, no s3, s4, 2/6 SEM at RUSB. Abdomen: Soft, non-tender, non-distended, BS + x 4.  Extremities: No clubbing, cyanosis or edema. DP/PT/Radials 2+ and equal bilaterally.  Labs    Troponin [facility] Test)  Recent Labs  07/14/15 1248  TROPIPOC 0.01    Recent Labs  07/14/15 1912  TROPONINI <0.03   Lab Results  Component Value Date   WBC 7.6 07/14/2015   HGB 13.5 07/14/2015   HCT 41.0 07/14/2015   MCV 89.3 07/14/2015   PLT 214 07/14/2015     Recent Labs Lab 07/14/15 1326  NA 134*  K 4.6  CL 98*  CO2 26  BUN 19  CREATININE 0.92  CALCIUM 9.5  PROT 8.1  BILITOT 0.8  ALKPHOS 51  ALT 19  AST 25  GLUCOSE 119*   Lab Results  Component Value Date   CHOL 202* 07/14/2015   HDL 63 07/14/2015   LDLCALC 125* 07/14/2015   TRIG 70 07/14/2015   No results found for: [facility]   Radiology Studies    Dg Chest 2 View: 07/14/2015  CLINICAL DATA:  Stroke. EXAM: CHEST  2 VIEW COMPARISON:  None. FINDINGS: The heart size and mediastinal contours are within normal limits. No pneumothorax or pleural effusion is noted. Hyperexpansion of the lungs is noted suggesting chronic obstructive pulmonary disease. Atherosclerosis of thoracic aorta is noted. Left lung is clear. Ill-defined density is noted in right upper lobe which may represent inflammation, but there is noted nodular density peripherally in right upper lobe. The visualized skeletal structures are unremarkable. IMPRESSION: Ill-defined density seen in right upper lobe which may represent inflammation or possibly pneumonia. However, small nodular density is noted laterally in right upper lobe concerning for possible neoplasm. CT scan of the chest with intravenous contrast is recommended for further evaluation. Electronically Signed   By: Lupita Raider, M.D.   On: 07/14/2015 16:55   Ct Head Wo Contrast: 07/14/2015  CLINICAL DATA:  Severe headache. EXAM: CT HEAD WITHOUT CONTRAST TECHNIQUE:  Contiguous axial images were obtained from the base of the skull through the vertex without intravenous contrast. COMPARISON:  None. FINDINGS: Bony calvarium appears intact. Mild diffuse cortical atrophy is noted. Mild chronic ischemic white matter disease is noted. No mass effect or midline shift is noted. Ventricular size is within normal limits. There is no evidence of mass lesion, hemorrhage or acute infarction. IMPRESSION: Mild diffuse cortical atrophy. Mild chronic ischemic white matter disease. No acute intracranial abnormality seen. Electronically Signed   By: Lupita Raider, M.D.   On: 07/14/2015 13:15   Ct Angio Chest Pe W/cm &/or Wo Cm: 07/15/2015  CLINICAL DATA:  Shortness of breath, right upper lobe lesion, abnormal chest radiograph. EXAM: CT ANGIOGRAPHY CHEST WITH CONTRAST TECHNIQUE: Multidetector CT imaging of the chest was performed using the standard protocol during bolus administration of intravenous contrast. Multiplanar CT image reconstructions and MIPs were obtained to evaluate  the vascular anatomy. CONTRAST:  75 cc Isovue 370. COMPARISON:  CT neck 07/14/2015. FINDINGS: Mediastinum/Nodes: Negative for pulmonary embolus. No pathologically enlarged mediastinal lymph nodes. Bi hilar lymphoid tissue. No axillary adenopathy. Heart is mildly enlarged. No pericardial effusion. Lungs/Pleura: Biapical pleural parenchymal scarring. Patchy diffuse peribronchovascular nodularity and peribronchial thickening. Slightly thick-walled cavitary lesion in posterior right upper lobe measures 1.7 x 1.8 cm (series 6, image 35). There is surrounding concentrated peribronchovascular nodularity, bronchial thickening and mucoid impaction. Tiny right pleural effusion. Airway is otherwise unremarkable. Upper abdomen: Visualized portions of the liver, adrenal glands, kidneys, spleen, pancreas and stomach are grossly unremarkable. Musculoskeletal: No worrisome lytic or sclerotic lesions. Review of the MIP images confirms  the above findings. IMPRESSION: 1. Negative for pulmonary embolus. 2. Cavitary lesion in the posterior right upper lobe is likely infectious or inflammatory in etiology, given surrounding peribronchovascular nodularity, peribronchial thickening and mucoid impaction. Developing abscess or malignancy cannot be excluded. 3. Scattered peribronchovascular nodularity, peribronchial thickening and mucoid impaction, indicative of mycobacterium avium complex. 4. Tiny right pleural effusion. Electronically Signed   By: Leanna Battles M.D.   On: 07/15/2015 11:09   Mr Brain Wo Contrast: 07/14/2015  CLINICAL DATA:  Stroke.  Headache. EXAM: MRI HEAD WITHOUT CONTRAST TECHNIQUE: Multiplanar, multiecho pulse sequences of the brain and surrounding structures were obtained without intravenous contrast. COMPARISON:  CT 07/14/2015 FINDINGS: Negative for acute infarct. No significant chronic ischemia Mild atrophy.  Negative for hydrocephalus. Negative for intracranial hemorrhage.  No fluid collection Negative for mass or edema.  No shift of the midline structures Pituitary normal.  Skull base normal. Mild mucosal edema paranasal sinuses.  Normal orbit bilaterally. IMPRESSION: Normal for age. Mild mucosal edema paranasal sinuses. Electronically Signed   By: Marlan Palau M.D.   On: 07/14/2015 16:41    EKG & Cardiac Imaging    EKG: Atrial fibrillation, HR 93, slight ST depression in lateral leads.  Echocardiogram: 07/15/2015 Study Conclusions - Left ventricle: The cavity size was normal. There was moderate  focal basal hypertrophy of the septum. Systolic function was  vigorous. The estimated ejection fraction was in the range of 65%  to 70%. Wall motion was normal; there were no regional wall  motion abnormalities. Doppler parameters are consistent with high  ventricular filling pressure. - Aortic valve: Valve mobility was restricted. There was very mild  stenosis. Peak velocity (S): 223 cm/s. Mean gradient (S): 11  mm  Hg. - Mitral valve: Severely calcified annulus. Mildly thickened  leaflets . There was mild regurgitation. - Left atrium: The atrium was moderately dilated. Volume/bsa, ES,  (1-plane Simpson&'s, A2C): 51.7 ml/m^2.  Impressions: - No cardiac source of emboli was indentified.  Assessment & Plan    1. New Onset Atrial Fibrillation - no prior cardiac history. Denies any prior history of palpitations. Does report increased fatigue for the past year.  - presented in atrial fibrillation and has maintained this rhythm throughout admission, mostly rate-controlled in the 70's - 80's. Unknown duration, granted her echo does show a moderately dilated LA, therefore the chronicity likely dates back prior to her Lyme Disease diagnosis. - TSH elevated, will check free T4.  - This patients CHA2DS2-VASc Score and unadjusted Ischemic Stroke Rate (% per year) is equal to 9.7 % stroke rate/year from a score of 6 (HTN, Female, Age (2), TIA (2)). Her husband is on Coumadin and she prefers this as well to a NOAC. The different options for anticoagulation were discussed with the patient and her two daughters and they  are in agreement to start Coumadin. Will consult pharmacy to assist with Lovenox bridging and initiation of Coumadin. Will get Case Management to see about Lovenox cost prior to discharge. - will start Lopressor 12.5mg  BID for rate-control and titrate as HR and BP allows. I suspect her HR becomes more more elevated at home with her active lifestyle than what is showing on telemetry while here and might be contributing to her fatigue.  2. TIA - presented with new onset headache and AMS.  - CT Head and MRI negative for acute intracranial abnormality and symptoms resolved in less than 24 hours. - per Neurology.  3. Hypothyroidism - was on Synthroid PTA.  - TSH elevated to 5.526. Will check Free T4.   4. HTN - BP has been elevated up to 178/87 in the past 24 hours. - will start low-dose BB.    5. HLD - LDL elevated to 125. - recommend initiating statin therapy.  6. Lyme Disease - diagnosed 2 weeks ago. Continue Abx treatment. - per admitting team   Signed, Ellsworth LennoxBrittany M Strader, PA-C 07/15/2015, 4:42 PM Pager: [phone]   I have seen, examined and evaluated the patient this PM along with Randall AnBrittany Strader, PA-C.  After reviewing all the available data and chart,  I agree with her findings, examination as well as impression recommendations.  Very pleasant 82 year old woman with new diagnosis of TIA, also found to have atrial fibrillation of unknown duration. She is totally asymptomatic and rate controlled.  I had a long talk with her  And her daughter about the necessity of anticoagulation. Her husband is currently on warfarin, and her initial thought was that would simply go on warfarin. They then asked my opinion, and my recommendation was to consider Eliquis.  Warfarin would require bridging,, likely with Lovenox injections, however Eliquis would simply require taking the pill with no monitoring. We discussed the risks and benefits of  DOAC versus  Warfarin. I spent at least 45 minutes of direct consultation (in addition to ~30 min by PA)  She is currently planning to decide, after discussing with her daughter.  We have placed an order for   Pharmacist assistance with warfarin and/or ELIQUIS dosing. Would recommend starting either option tonight.  Recommend low-dose beta blocker for rate control. Otherwise she is stable.  Will arrange follow-up as outpatient.    Marykay LexHARDING, DAVID W, M.D., M.S. Interventional Cardiologist   Pager # [phone] Phone # [phone] [address] [place], [place] [id number]

## 2015-07-15 NOTE — Progress Notes (Addendum)
ANTICOAGULATION CONSULT NOTE - Initial Consult  Pharmacy Consult for Apixaban Indication: atrial fibrillation  Allergies  Allergen Reactions  . Tylenol [Acetaminophen] Other (See Comments)    Makes my body feel numb     Patient Measurements: Weight: 111 lb (50.349 kg) Heparin Dosing Weight:   Vital Signs: Temp: 97.5 F (36.4 C) (04/26 1010) Temp Source: Oral (04/26 1010) BP: 161/87 mmHg (04/26 1010) Pulse Rate: 83 (04/26 1010)  Labs:  Recent Labs  07/14/15 1325 07/14/15 1326 07/14/15 1912  HGB  --  13.5  --   HCT  --  41.0  --   PLT  --  214  --   APTT 30  --   --   LABPROT 14.1  --   --   INR 1.07  --   --   CREATININE  --  0.92  --   TROPONINI  --   --  <0.03    CrCl cannot be calculated (Unknown ideal weight.).   Medical History: Past Medical History  Diagnosis Date  . Anemia   . Hypothyroid   . Stroke [facility](H[facility])     Medications:  Prescriptions prior to admission  Medication Sig Dispense Refill Last Dose  . doxycycline (ADOXA) 50 MG tablet Take 50 mg by mouth 2 (two) times daily.  0 07/13/2015 at Unknown time  . ferrous gluconate (FERGON) 240 (27 FE) MG tablet Take 240 mg by mouth daily.  6 07/13/2015 at Unknown time  . levothyroxine (SYNTHROID, LEVOTHROID) 50 MCG tablet Take 50 mcg by mouth daily.  12 07/13/2015 at Unknown time  . penicillin v potassium (VEETID) 500 MG tablet Take 500 mg by mouth 2 (two) times daily.  0 07/13/2015 at Unknown time  . Vitamin D, Ergocalciferol, (DRISDOL) 50000 units CAPS capsule Take 50,000 Units by mouth once a week.  12 Past Week at Unknown time   Scheduled:  . aspirin  325 mg Oral Daily  . doxycycline  50 mg Oral Q12H  . levothyroxine  50 mcg Oral QAC breakfast  . metoprolol tartrate  12.5 mg Oral BID  . penicillin v potassium  500 mg Oral BID   Infusions:    Assessment: 82yo female with history of anemia presented with HA, nausea and AMS. Since admission pt was found to be new onset Afib. Pharmacy is consulted to  dose apixaban for atrial fibrillation. Based on age and weight, pt qualifies for reduced dose of apixaban.  Goal of Therapy:  Monitor platelets by anticoagulation protocol: Yes   Plan:  Apixaban 2.5mg  BID Monitor s/sx of bleeding Educate patient on apixaban  Arlean Hoppingorey M. Newman PiesBall, PharmD, BCPS Clinical Pharmacist Pager [phone] 07/15/2015,4:45 PM

## 2015-07-16 DIAGNOSIS — R51 Headache: Secondary | ICD-10-CM

## 2015-07-16 DIAGNOSIS — I10 Essential (primary) hypertension: Secondary | ICD-10-CM | POA: Diagnosis not present

## 2015-07-16 DIAGNOSIS — I749 Embolism and thrombosis of unspecified artery: Secondary | ICD-10-CM

## 2015-07-16 DIAGNOSIS — R42 Dizziness and giddiness: Secondary | ICD-10-CM | POA: Diagnosis not present

## 2015-07-16 DIAGNOSIS — G459 Transient cerebral ischemic attack, unspecified: Secondary | ICD-10-CM | POA: Insufficient documentation

## 2015-07-16 DIAGNOSIS — R519 Headache, unspecified: Secondary | ICD-10-CM | POA: Insufficient documentation

## 2015-07-16 DIAGNOSIS — M6289 Other specified disorders of muscle: Secondary | ICD-10-CM | POA: Diagnosis not present

## 2015-07-16 DIAGNOSIS — I4891 Unspecified atrial fibrillation: Secondary | ICD-10-CM | POA: Diagnosis not present

## 2015-07-16 LAB — COMPREHENSIVE METABOLIC PANEL
ALT: 14 U/L (ref 14–54)
AST: 18 U/L (ref 15–41)
Albumin: 3.3 g/dL — ABNORMAL LOW (ref 3.5–5.0)
Alkaline Phosphatase: 37 U/L — ABNORMAL LOW (ref 38–126)
Anion gap: 9 (ref 5–15)
BUN: 16 mg/dL (ref 6–20)
CHLORIDE: 103 mmol/L (ref 101–111)
CO2: 25 mmol/L (ref 22–32)
CREATININE: 0.73 mg/dL (ref 0.44–1.00)
Calcium: 8.9 mg/dL (ref 8.9–10.3)
Glucose, Bld: 99 mg/dL (ref 65–99)
Potassium: 3.9 mmol/L (ref 3.5–5.1)
Sodium: 137 mmol/L (ref 135–145)
TOTAL PROTEIN: 6.2 g/dL — AB (ref 6.5–8.1)
Total Bilirubin: 0.6 mg/dL (ref 0.3–1.2)

## 2015-07-16 LAB — CBC
HCT: 34.6 % — ABNORMAL LOW (ref 36.0–46.0)
Hemoglobin: 11.4 g/dL — ABNORMAL LOW (ref 12.0–15.0)
MCH: 29.4 pg (ref 26.0–34.0)
MCHC: 32.9 g/dL (ref 30.0–36.0)
MCV: 89.2 fL (ref 78.0–100.0)
PLATELETS: 189 10*3/uL (ref 150–400)
RBC: 3.88 MIL/uL (ref 3.87–5.11)
RDW: 13.8 % (ref 11.5–15.5)
WBC: 6.3 10*3/uL (ref 4.0–10.5)

## 2015-07-16 LAB — T4, FREE: FREE T4: 1.06 ng/dL (ref 0.61–1.12)

## 2015-07-16 MED ORDER — DOXYCYCLINE HYCLATE 100 MG PO TABS
100.0000 mg | ORAL_TABLET | Freq: Two times a day (BID) | ORAL | Status: DC
Start: 2015-07-16 — End: 2015-11-18

## 2015-07-16 MED ORDER — METOPROLOL TARTRATE 25 MG PO TABS: 25.0000 mg | ORAL_TABLET | Freq: Two times a day (BID) | ORAL | Status: DC

## 2015-07-16 MED ORDER — SODIUM CHLORIDE 0.9 % IV SOLN
INTRAVENOUS | Status: AC
  Administered 2015-07-16 (×2): via INTRAVENOUS

## 2015-07-16 MED ORDER — METOPROLOL TARTRATE 12.5 MG HALF TABLET
12.5000 mg | ORAL_TABLET | Freq: Two times a day (BID) | ORAL | Status: DC
  Administered 2015-07-16 – 2015-07-17 (×2): 12.5 mg via ORAL
  Filled 2015-07-16 (×2): qty 1

## 2015-07-16 MED ORDER — METOPROLOL SUCCINATE ER 25 MG PO TB24: 25.0000 mg | ORAL_TABLET | Freq: Every day | ORAL | Status: DC

## 2015-07-16 MED ORDER — APIXABAN 2.5 MG PO TABS: 2.5000 mg | ORAL_TABLET | Freq: Two times a day (BID) | ORAL | Status: DC

## 2015-07-16 NOTE — Progress Notes (Addendum)
[facility] PROGRESS NOTE  Hildred Alamin ZOX:[id number] DOB: [birth year] DOA: 07/14/2015   PCP: Aida Puffer, MD     Assessment/Plan: Principal Problem:   Left-sided weakness Active Problems:   Stroke (HCC)   New onset atrial fibrillation (HCC)   Headache   Hypertension   Positive Lyme disease serology   Acute left-sided weakness   Hypothyroidism   Encounter for anticoagulation discussion and counseling   Cephalalgia    82 y.o. female with a Past Medical History of anemia, hypothyroid, CVA presenting to hospital after family noted she was not acting her normal self, having difficulty with finding words, and having a significant HA involving bilateral frontal region and back of her neck. Currently she has frontal HA but no neck pain after the Reglan. She is having difficulty expressing herself and answering questions. CT head showed no acute changes. While in ED she was found to be in new onset Afib. Patient admitted for   stroke like symptoms in the setting of new onset Afib.     Assessment and plan  Left-sided weakness/headache. CT of the head without acute abnormality. MRI of the brain negative for acute infarct, normal for age per report. CTA neck No evidence of hemodynamically significant stenosis involving either carotid artery or vertebral artery. CTA head with mild to moderate narrowing anteriorly and mild narrowing posteriorly. Evaluated by neurology in the emergency department recommends admission for stroke workup. Patient outside TPA window. Continue telemetry-continues to show atrial fibrillation Pending carotid Dopplers , 2-D echo shows EF of 65-70%, LA moderately dilated chest x-ray shows possible pneumonia -right upper lobe density concerning for neoplasm,   TSH 5.5, free T4 within normal limits  hemoglobin A1c 6.3, lipid panel - LDL 125 she passed  bedside swallow eval -Physical therapy/occupational therapy -Frequent neuro checks -Statin and  aspirin On Eliquis for new onset atrial fibrillation  New onset atrial fibrillation rate controlled. EKG as noted above. Initial troponin negative. INR 1.07 [place] vasc score 3 -Discontinued aspirin, 2-D echo as above -Cycle troponin -Repeat EKG in the morning  Cardiology consultation for new onset A. fib, started on Eliquis and metoprolol, Rate controlled at rest, however feels very dizzy and lightheaded with ambulation Will Discuss with cardiology, Dr. Herbie Baltimore if  the patient is stable for discharge today PATIENT FELT DIZZY SITTING ON THE EDGE OF THE BED , OT had to lay her back down Orthostatics negative, will hydrate overnight and assess with PT in am      Hypertension. History of same. Blood pressure 172/93 on admission. -Monitor closely -Allow for permissive hypertension -When necessary hydralazine     Positive Lyme disease serology reportedly. Recent started on doxycycline and penicillin V. Daughter reports she is 2 weeks into a three-week regimen -We'll continue these antibiotics   Right upper lobe nodule CT scan with contrast to rule out malignancy, pneumonia CT scan negative for pulmonary embolism patient has a cavitary lesion in the posterior right upper lobe, differential diagnosis could include Mycobacterium avium complex vs malignancy Discussed with Dr. Ninetta Lights, check expectorated sputum  for MAC , family would not want a bronch   DVT prophylaxsis  Lovenox   Code Status:   full code      Family Communication: Discussed in detail with the patient, all imaging results, lab results explained to the patient   Disposition Plan: anticipate DC in am ,if dizziness improves after IVF       Consult  Neurology  Cardiology  Procedures-none  Antibiotics:  Doxycycline 100 mg twice a day-4/25 Penicillin V - 4/25      HPI/Subjective: Patient dizzy this morning with minimal ambulation  Objective: Filed Vitals:   07/15/15 1854 07/16/15 0144 07/16/15  0510 07/16/15 1007  BP: 161/98 137/64 136/84 176/66  Pulse: 85 73 86 89  Temp: 98.1 F (36.7 C) 97.9 F (36.6 C) 98 F (36.7 C) 98 F (36.7 C)  TempSrc: Oral Oral Oral Oral  Resp: Weight:      SpO2: 95% 97% 96% 98%   No intake or output data in the 24 hours ending 07/16/15 1329  Exam:  Examination:  General exam: Appears calm and comfortable  Respiratory system: Clear to auscultation. Respiratory effort normal. Cardiovascular system: S1 & S2 heard, RRR. No JVD, murmurs, rubs, gallops or clicks. No pedal edema. Gastrointestinal system: Abdomen is nondistended, soft and nontender. No organomegaly or masses felt. Normal bowel sounds heard. Neurological: She is alert. She has normal reflexes.  Objective weakness in the left arm and left leg, rated 4 out of 5. Decreased sensation in the left arm and left leg as well. Patient is able to voice when her right leg or right arm is touched, however, when both right and left sides are touched and her eyes are closed, patient only voices that she feels the sensation on the right. No sensory deficits on the right. Strength 5 out of 5 on the right. Skin: No rashes, lesions or ulcers Psychiatry: Judgement and insight appear normal. Mood & affect appropriate.     Data Reviewed: I have personally reviewed following labs and imaging studies  Micro Results Recent Results (from the past 240 hour(s))  Urine culture     Status: Abnormal   Collection Time: 07/14/15  3:19 PM  Result Value Ref Range Status   Specimen Description URINE, CLEAN CATCH  Final   Special Requests NONE  Final   Culture MULTIPLE SPECIES PRESENT, SUGGEST RECOLLECTION (A)  Final   Report Status 07/15/2015 FINAL  Final    Radiology Reports Ct Angio Head W/cm &/or Wo Cm  07/14/2015  CLINICAL DATA:  82 year old female with altered mental status and difficulty finding words. Headache. Subsequent encounter. EXAM: CT ANGIOGRAPHY HEAD AND NECK TECHNIQUE: Multidetector  CT imaging of the head and neck was performed using the standard protocol during bolus administration of intravenous contrast. Multiplanar CT image reconstructions and MIPs were obtained to evaluate the vascular anatomy. Carotid stenosis measurements (when applicable) are obtained utilizing NASCET criteria, using the distal internal carotid diameter as the denominator. CONTRAST:  50 cc Isovue 370. COMPARISON:  07/14/2015 brain MR and head CT. FINDINGS: CT HEAD Brain: No intracranial hemorrhage or CT evidence of large acute infarct. No intracranial mass or abnormal enhancement. Global atrophy without hydrocephalus. Small vessel disease changes. Calvarium and skull base: Negative. Paranasal sinuses: Clear. Orbits: Negative. CTA NECK Aortic arch: 3 vessel arch. Right carotid system: Mild plaque right carotid bifurcation without significant narrowing. Left carotid system: Mild plaque origin left common carotid artery. Plaque proximal left internal carotid artery without significant stenosis. Vertebral arteries:No significant stenosis of the vertebral arteries. Skeleton: No osseous destructive lesion. Cervical spondylotic changes without high-grade spinal stenosis. Other neck: No worrisome primary neck mass. Upper chest: Lung parenchymal changes most notable posterior aspect right upper lobe with there is a 2 cm cavitary pleural-based lesion. CT of the chest recommended for further delineation. This can be performed without contrast as patient has had contrast for the current  exam. CTA HEAD Anterior circulation: Calcified internal carotid artery cavernous segment with mild moderate narrowing bilaterally. Mild moderate narrowing supraclinoid aspect left internal carotid artery. No significant stenosis M1 segment of either middle cerebral artery. Middle cerebral artery mild to moderate branch vessel narrowing and irregularity. Mild moderate narrowing A1 segment left anterior cerebral artery. A2 segment anterior cerebral  artery mild branch vessel irregularity bilaterally. Posterior circulation: Left vertebral artery is dominant. Mild tandem stenosis of the distal left vertebral artery. Mild narrowing distal right vertebral artery. Mild narrowing irregularity basilar artery without high-grade stenosis. Moderate to marked narrowing P1 -2 junction posterior cerebral artery bilaterally. Mild moderate narrowing distal posterior cerebral artery branches bilaterally. Venous sinuses: Patent. Anatomic variants: Negative. Delayed phase: Negative. IMPRESSION: CT HEAD No intracranial hemorrhage or CT evidence of large acute infarct. No intracranial mass or abnormal enhancement. Global atrophy without hydrocephalus. Small vessel disease changes. CTA NECK No evidence of hemodynamically significant stenosis involving either carotid artery or vertebral artery. Lung parenchymal changes most notable posterior aspect right upper lobe where there is a 2 cm cavitary pleural-based lesion. CT of the chest recommended for further delineation. This can be performed without contrast as patient has had contrast for the current exam. CTA HEAD Calcified internal carotid artery cavernous segment with mild to moderate narrowing bilaterally. Mild to moderate narrowing supraclinoid aspect left internal carotid artery. No significant stenosis M1 segment of either middle cerebral artery. Middle cerebral artery mild to moderate branch vessel narrowing and irregularity bilaterally. Mild to moderate narrowing A1 segment left anterior cerebral artery. A2 segment anterior cerebral artery mild branch vessel irregularity bilaterally. Left vertebral artery is dominant. Mild tandem stenosis of the distal left vertebral artery. Mild narrowing distal right vertebral artery. Mild narrowing and irregularity basilar artery without high-grade stenosis. Moderate to marked narrowing P1 -2 junction posterior cerebral artery bilaterally. Mild to moderate narrowing distal posterior  cerebral artery branches bilaterally. Electronically Signed   By: Lacy Duverney M.D.   On: 07/14/2015 17:03   Dg Chest 2 View  07/14/2015  CLINICAL DATA:  Stroke. EXAM: CHEST  2 VIEW COMPARISON:  None. FINDINGS: The heart size and mediastinal contours are within normal limits. No pneumothorax or pleural effusion is noted. Hyperexpansion of the lungs is noted suggesting chronic obstructive pulmonary disease. Atherosclerosis of thoracic aorta is noted. Left lung is clear. Ill-defined density is noted in right upper lobe which may represent inflammation, but there is noted nodular density peripherally in right upper lobe. The visualized skeletal structures are unremarkable. IMPRESSION: Ill-defined density seen in right upper lobe which may represent inflammation or possibly pneumonia. However, small nodular density is noted laterally in right upper lobe concerning for possible neoplasm. CT scan of the chest with intravenous contrast is recommended for further evaluation. Electronically Signed   By: Lupita Raider, M.D.   On: 07/14/2015 16:55   Ct Head Wo Contrast  07/14/2015  CLINICAL DATA:  Severe headache. EXAM: CT HEAD WITHOUT CONTRAST TECHNIQUE: Contiguous axial images were obtained from the base of the skull through the vertex without intravenous contrast. COMPARISON:  None. FINDINGS: Bony calvarium appears intact. Mild diffuse cortical atrophy is noted. Mild chronic ischemic white matter disease is noted. No mass effect or midline shift is noted. Ventricular size is within normal limits. There is no evidence of mass lesion, hemorrhage or acute infarction. IMPRESSION: Mild diffuse cortical atrophy. Mild chronic ischemic white matter disease. No acute intracranial abnormality seen. Electronically Signed   By: Lupita Raider, M.D.   On:  07/14/2015 13:15   Ct Angio Neck W/cm &/or Wo/cm  07/14/2015  CLINICAL DATA:  82 year old female with altered mental status and difficulty finding words. Headache.  Subsequent encounter. EXAM: CT ANGIOGRAPHY HEAD AND NECK TECHNIQUE: Multidetector CT imaging of the head and neck was performed using the standard protocol during bolus administration of intravenous contrast. Multiplanar CT image reconstructions and MIPs were obtained to evaluate the vascular anatomy. Carotid stenosis measurements (when applicable) are obtained utilizing NASCET criteria, using the distal internal carotid diameter as the denominator. CONTRAST:  50 cc Isovue 370. COMPARISON:  07/14/2015 brain MR and head CT. FINDINGS: CT HEAD Brain: No intracranial hemorrhage or CT evidence of large acute infarct. No intracranial mass or abnormal enhancement. Global atrophy without hydrocephalus. Small vessel disease changes. Calvarium and skull base: Negative. Paranasal sinuses: Clear. Orbits: Negative. CTA NECK Aortic arch: 3 vessel arch. Right carotid system: Mild plaque right carotid bifurcation without significant narrowing. Left carotid system: Mild plaque origin left common carotid artery. Plaque proximal left internal carotid artery without significant stenosis. Vertebral arteries:No significant stenosis of the vertebral arteries. Skeleton: No osseous destructive lesion. Cervical spondylotic changes without high-grade spinal stenosis. Other neck: No worrisome primary neck mass. Upper chest: Lung parenchymal changes most notable posterior aspect right upper lobe with there is a 2 cm cavitary pleural-based lesion. CT of the chest recommended for further delineation. This can be performed without contrast as patient has had contrast for the current exam. CTA HEAD Anterior circulation: Calcified internal carotid artery cavernous segment with mild moderate narrowing bilaterally. Mild moderate narrowing supraclinoid aspect left internal carotid artery. No significant stenosis M1 segment of either middle cerebral artery. Middle cerebral artery mild to moderate branch vessel narrowing and irregularity. Mild moderate  narrowing A1 segment left anterior cerebral artery. A2 segment anterior cerebral artery mild branch vessel irregularity bilaterally. Posterior circulation: Left vertebral artery is dominant. Mild tandem stenosis of the distal left vertebral artery. Mild narrowing distal right vertebral artery. Mild narrowing irregularity basilar artery without high-grade stenosis. Moderate to marked narrowing P1 -2 junction posterior cerebral artery bilaterally. Mild moderate narrowing distal posterior cerebral artery branches bilaterally. Venous sinuses: Patent. Anatomic variants: Negative. Delayed phase: Negative. IMPRESSION: CT HEAD No intracranial hemorrhage or CT evidence of large acute infarct. No intracranial mass or abnormal enhancement. Global atrophy without hydrocephalus. Small vessel disease changes. CTA NECK No evidence of hemodynamically significant stenosis involving either carotid artery or vertebral artery. Lung parenchymal changes most notable posterior aspect right upper lobe where there is a 2 cm cavitary pleural-based lesion. CT of the chest recommended for further delineation. This can be performed without contrast as patient has had contrast for the current exam. CTA HEAD Calcified internal carotid artery cavernous segment with mild to moderate narrowing bilaterally. Mild to moderate narrowing supraclinoid aspect left internal carotid artery. No significant stenosis M1 segment of either middle cerebral artery. Middle cerebral artery mild to moderate branch vessel narrowing and irregularity bilaterally. Mild to moderate narrowing A1 segment left anterior cerebral artery. A2 segment anterior cerebral artery mild branch vessel irregularity bilaterally. Left vertebral artery is dominant. Mild tandem stenosis of the distal left vertebral artery. Mild narrowing distal right vertebral artery. Mild narrowing and irregularity basilar artery without high-grade stenosis. Moderate to marked narrowing P1 -2 junction  posterior cerebral artery bilaterally. Mild to moderate narrowing distal posterior cerebral artery branches bilaterally. Electronically Signed   By: Lacy DuverneySteven  Olson M.D.   On: 07/14/2015 17:03   Ct Angio Chest Pe W/cm &/or Wo Cm  07/15/2015  CLINICAL DATA:  Shortness of breath, right upper lobe lesion, abnormal chest radiograph. EXAM: CT ANGIOGRAPHY CHEST WITH CONTRAST TECHNIQUE: Multidetector CT imaging of the chest was performed using the standard protocol during bolus administration of intravenous contrast. Multiplanar CT image reconstructions and MIPs were obtained to evaluate the vascular anatomy. CONTRAST:  75 cc Isovue 370. COMPARISON:  CT neck 07/14/2015. FINDINGS: Mediastinum/Nodes: Negative for pulmonary embolus. No pathologically enlarged mediastinal lymph nodes. Bi hilar lymphoid tissue. No axillary adenopathy. Heart is mildly enlarged. No pericardial effusion. Lungs/Pleura: Biapical pleural parenchymal scarring. Patchy diffuse peribronchovascular nodularity and peribronchial thickening. Slightly thick-walled cavitary lesion in posterior right upper lobe measures 1.7 x 1.8 cm (series 6, image 35). There is surrounding concentrated peribronchovascular nodularity, bronchial thickening and mucoid impaction. Tiny right pleural effusion. Airway is otherwise unremarkable. Upper abdomen: Visualized portions of the liver, adrenal glands, kidneys, spleen, pancreas and stomach are grossly unremarkable. Musculoskeletal: No worrisome lytic or sclerotic lesions. Review of the MIP images confirms the above findings. IMPRESSION: 1. Negative for pulmonary embolus. 2. Cavitary lesion in the posterior right upper lobe is likely infectious or inflammatory in etiology, given surrounding peribronchovascular nodularity, peribronchial thickening and mucoid impaction. Developing abscess or malignancy cannot be excluded. 3. Scattered peribronchovascular nodularity, peribronchial thickening and mucoid impaction, indicative of  mycobacterium avium complex. 4. Tiny right pleural effusion. Electronically Signed   By: Leanna Battles M.D.   On: 07/15/2015 11:09   Mr Brain Wo Contrast  07/14/2015  CLINICAL DATA:  Stroke.  Headache. EXAM: MRI HEAD WITHOUT CONTRAST TECHNIQUE: Multiplanar, multiecho pulse sequences of the brain and surrounding structures were obtained without intravenous contrast. COMPARISON:  CT 07/14/2015 FINDINGS: Negative for acute infarct. No significant chronic ischemia Mild atrophy.  Negative for hydrocephalus. Negative for intracranial hemorrhage.  No fluid collection Negative for mass or edema.  No shift of the midline structures Pituitary normal.  Skull base normal. Mild mucosal edema paranasal sinuses.  Normal orbit bilaterally. IMPRESSION: Normal for age. Mild mucosal edema paranasal sinuses. Electronically Signed   By: Marlan Palau M.D.   On: 07/14/2015 16:41     CBC  Recent Labs Lab 07/14/15 1326 07/16/15 0522  WBC 7.6 6.3  HGB 13.5 11.4*  HCT 41.0 34.6*  PLT 214 189  MCV 89.3 89.2  MCH 29.4 29.4  MCHC 32.9 32.9  RDW 13.9 13.8  LYMPHSABS 1.8  --   MONOABS 0.3  --   EOSABS 0.0  --   BASOSABS 0.0  --     Chemistries   Recent Labs Lab 07/14/15 1326 07/16/15 0522  NA 134* 137  K 4.6 3.9  CL 98* 103  CO2 26 25  GLUCOSE 119* 99  BUN 19 16  CREATININE 0.92 0.73  CALCIUM 9.5 8.9  AST 25 18  ALT 19 14  ALKPHOS 51 37*  BILITOT 0.8 0.6   ------------------------------------------------------------------------------------------------------------------ CrCl cannot be calculated (Unknown ideal weight.). ------------------------------------------------------------------------------------------------------------------  Recent Labs  07/14/15 1912  HGBA1C 6.3*   ------------------------------------------------------------------------------------------------------------------  Recent Labs  07/14/15 1912  CHOL 202*  HDL 63  LDLCALC 125*  TRIG 70  CHOLHDL 3.2    ------------------------------------------------------------------------------------------------------------------  Recent Labs  07/14/15 1912  TSH 5.526*   ------------------------------------------------------------------------------------------------------------------ No results for input(s): VITAMINB12, FOLATE, FERRITIN, TIBC, IRON, RETICCTPCT in the last 72 hours.  Coagulation profile  Recent Labs Lab 07/14/15 1325  INR 1.07    No results for input(s): DDIMER in the last 72 hours.  Cardiac Enzymes  Recent Labs Lab 07/14/15 1912  TROPONINI <0.03   ------------------------------------------------------------------------------------------------------------------  Invalid input(s): POCBNP   CBG: No results for input(s): GLUCAP in the last 168 hours.     Studies: Ct Angio Head W/cm &/or Wo Cm  07/14/2015  CLINICAL DATA:  82 year old female with altered mental status and difficulty finding words. Headache. Subsequent encounter. EXAM: CT ANGIOGRAPHY HEAD AND NECK TECHNIQUE: Multidetector CT imaging of the head and neck was performed using the standard protocol during bolus administration of intravenous contrast. Multiplanar CT image reconstructions and MIPs were obtained to evaluate the vascular anatomy. Carotid stenosis measurements (when applicable) are obtained utilizing NASCET criteria, using the distal internal carotid diameter as the denominator. CONTRAST:  50 cc Isovue 370. COMPARISON:  07/14/2015 brain MR and head CT. FINDINGS: CT HEAD Brain: No intracranial hemorrhage or CT evidence of large acute infarct. No intracranial mass or abnormal enhancement. Global atrophy without hydrocephalus. Small vessel disease changes. Calvarium and skull base: Negative. Paranasal sinuses: Clear. Orbits: Negative. CTA NECK Aortic arch: 3 vessel arch. Right carotid system: Mild plaque right carotid bifurcation without significant narrowing. Left carotid system: Mild plaque origin left  common carotid artery. Plaque proximal left internal carotid artery without significant stenosis. Vertebral arteries:No significant stenosis of the vertebral arteries. Skeleton: No osseous destructive lesion. Cervical spondylotic changes without high-grade spinal stenosis. Other neck: No worrisome primary neck mass. Upper chest: Lung parenchymal changes most notable posterior aspect right upper lobe with there is a 2 cm cavitary pleural-based lesion. CT of the chest recommended for further delineation. This can be performed without contrast as patient has had contrast for the current exam. CTA HEAD Anterior circulation: Calcified internal carotid artery cavernous segment with mild moderate narrowing bilaterally. Mild moderate narrowing supraclinoid aspect left internal carotid artery. No significant stenosis M1 segment of either middle cerebral artery. Middle cerebral artery mild to moderate branch vessel narrowing and irregularity. Mild moderate narrowing A1 segment left anterior cerebral artery. A2 segment anterior cerebral artery mild branch vessel irregularity bilaterally. Posterior circulation: Left vertebral artery is dominant. Mild tandem stenosis of the distal left vertebral artery. Mild narrowing distal right vertebral artery. Mild narrowing irregularity basilar artery without high-grade stenosis. Moderate to marked narrowing P1 -2 junction posterior cerebral artery bilaterally. Mild moderate narrowing distal posterior cerebral artery branches bilaterally. Venous sinuses: Patent. Anatomic variants: Negative. Delayed phase: Negative. IMPRESSION: CT HEAD No intracranial hemorrhage or CT evidence of large acute infarct. No intracranial mass or abnormal enhancement. Global atrophy without hydrocephalus. Small vessel disease changes. CTA NECK No evidence of hemodynamically significant stenosis involving either carotid artery or vertebral artery. Lung parenchymal changes most notable posterior aspect right upper  lobe where there is a 2 cm cavitary pleural-based lesion. CT of the chest recommended for further delineation. This can be performed without contrast as patient has had contrast for the current exam. CTA HEAD Calcified internal carotid artery cavernous segment with mild to moderate narrowing bilaterally. Mild to moderate narrowing supraclinoid aspect left internal carotid artery. No significant stenosis M1 segment of either middle cerebral artery. Middle cerebral artery mild to moderate branch vessel narrowing and irregularity bilaterally. Mild to moderate narrowing A1 segment left anterior cerebral artery. A2 segment anterior cerebral artery mild branch vessel irregularity bilaterally. Left vertebral artery is dominant. Mild tandem stenosis of the distal left vertebral artery. Mild narrowing distal right vertebral artery. Mild narrowing and irregularity basilar artery without high-grade stenosis. Moderate to marked narrowing P1 -2 junction posterior cerebral artery bilaterally. Mild to moderate narrowing distal posterior cerebral artery branches bilaterally. Electronically Signed   By: Ernie Hew.D.  On: 07/14/2015 17:03   Dg Chest 2 View  07/14/2015  CLINICAL DATA:  Stroke. EXAM: CHEST  2 VIEW COMPARISON:  None. FINDINGS: The heart size and mediastinal contours are within normal limits. No pneumothorax or pleural effusion is noted. Hyperexpansion of the lungs is noted suggesting chronic obstructive pulmonary disease. Atherosclerosis of thoracic aorta is noted. Left lung is clear. Ill-defined density is noted in right upper lobe which may represent inflammation, but there is noted nodular density peripherally in right upper lobe. The visualized skeletal structures are unremarkable. IMPRESSION: Ill-defined density seen in right upper lobe which may represent inflammation or possibly pneumonia. However, small nodular density is noted laterally in right upper lobe concerning for possible neoplasm. CT scan of  the chest with intravenous contrast is recommended for further evaluation. Electronically Signed   By: Lupita Raider, M.D.   On: 07/14/2015 16:55   Ct Angio Neck W/cm &/or Wo/cm  07/14/2015  CLINICAL DATA:  82 year old female with altered mental status and difficulty finding words. Headache. Subsequent encounter. EXAM: CT ANGIOGRAPHY HEAD AND NECK TECHNIQUE: Multidetector CT imaging of the head and neck was performed using the standard protocol during bolus administration of intravenous contrast. Multiplanar CT image reconstructions and MIPs were obtained to evaluate the vascular anatomy. Carotid stenosis measurements (when applicable) are obtained utilizing NASCET criteria, using the distal internal carotid diameter as the denominator. CONTRAST:  50 cc Isovue 370. COMPARISON:  07/14/2015 brain MR and head CT. FINDINGS: CT HEAD Brain: No intracranial hemorrhage or CT evidence of large acute infarct. No intracranial mass or abnormal enhancement. Global atrophy without hydrocephalus. Small vessel disease changes. Calvarium and skull base: Negative. Paranasal sinuses: Clear. Orbits: Negative. CTA NECK Aortic arch: 3 vessel arch. Right carotid system: Mild plaque right carotid bifurcation without significant narrowing. Left carotid system: Mild plaque origin left common carotid artery. Plaque proximal left internal carotid artery without significant stenosis. Vertebral arteries:No significant stenosis of the vertebral arteries. Skeleton: No osseous destructive lesion. Cervical spondylotic changes without high-grade spinal stenosis. Other neck: No worrisome primary neck mass. Upper chest: Lung parenchymal changes most notable posterior aspect right upper lobe with there is a 2 cm cavitary pleural-based lesion. CT of the chest recommended for further delineation. This can be performed without contrast as patient has had contrast for the current exam. CTA HEAD Anterior circulation: Calcified internal carotid artery  cavernous segment with mild moderate narrowing bilaterally. Mild moderate narrowing supraclinoid aspect left internal carotid artery. No significant stenosis M1 segment of either middle cerebral artery. Middle cerebral artery mild to moderate branch vessel narrowing and irregularity. Mild moderate narrowing A1 segment left anterior cerebral artery. A2 segment anterior cerebral artery mild branch vessel irregularity bilaterally. Posterior circulation: Left vertebral artery is dominant. Mild tandem stenosis of the distal left vertebral artery. Mild narrowing distal right vertebral artery. Mild narrowing irregularity basilar artery without high-grade stenosis. Moderate to marked narrowing P1 -2 junction posterior cerebral artery bilaterally. Mild moderate narrowing distal posterior cerebral artery branches bilaterally. Venous sinuses: Patent. Anatomic variants: Negative. Delayed phase: Negative. IMPRESSION: CT HEAD No intracranial hemorrhage or CT evidence of large acute infarct. No intracranial mass or abnormal enhancement. Global atrophy without hydrocephalus. Small vessel disease changes. CTA NECK No evidence of hemodynamically significant stenosis involving either carotid artery or vertebral artery. Lung parenchymal changes most notable posterior aspect right upper lobe where there is a 2 cm cavitary pleural-based lesion. CT of the chest recommended for further delineation. This can be performed without contrast as patient has had  contrast for the current exam. CTA HEAD Calcified internal carotid artery cavernous segment with mild to moderate narrowing bilaterally. Mild to moderate narrowing supraclinoid aspect left internal carotid artery. No significant stenosis M1 segment of either middle cerebral artery. Middle cerebral artery mild to moderate branch vessel narrowing and irregularity bilaterally. Mild to moderate narrowing A1 segment left anterior cerebral artery. A2 segment anterior cerebral artery mild branch  vessel irregularity bilaterally. Left vertebral artery is dominant. Mild tandem stenosis of the distal left vertebral artery. Mild narrowing distal right vertebral artery. Mild narrowing and irregularity basilar artery without high-grade stenosis. Moderate to marked narrowing P1 -2 junction posterior cerebral artery bilaterally. Mild to moderate narrowing distal posterior cerebral artery branches bilaterally. Electronically Signed   By: Lacy Duverney M.D.   On: 07/14/2015 17:03   Ct Angio Chest Pe W/cm &/or Wo Cm  07/15/2015  CLINICAL DATA:  Shortness of breath, right upper lobe lesion, abnormal chest radiograph. EXAM: CT ANGIOGRAPHY CHEST WITH CONTRAST TECHNIQUE: Multidetector CT imaging of the chest was performed using the standard protocol during bolus administration of intravenous contrast. Multiplanar CT image reconstructions and MIPs were obtained to evaluate the vascular anatomy. CONTRAST:  75 cc Isovue 370. COMPARISON:  CT neck 07/14/2015. FINDINGS: Mediastinum/Nodes: Negative for pulmonary embolus. No pathologically enlarged mediastinal lymph nodes. Bi hilar lymphoid tissue. No axillary adenopathy. Heart is mildly enlarged. No pericardial effusion. Lungs/Pleura: Biapical pleural parenchymal scarring. Patchy diffuse peribronchovascular nodularity and peribronchial thickening. Slightly thick-walled cavitary lesion in posterior right upper lobe measures 1.7 x 1.8 cm (series 6, image 35). There is surrounding concentrated peribronchovascular nodularity, bronchial thickening and mucoid impaction. Tiny right pleural effusion. Airway is otherwise unremarkable. Upper abdomen: Visualized portions of the liver, adrenal glands, kidneys, spleen, pancreas and stomach are grossly unremarkable. Musculoskeletal: No worrisome lytic or sclerotic lesions. Review of the MIP images confirms the above findings. IMPRESSION: 1. Negative for pulmonary embolus. 2. Cavitary lesion in the posterior right upper lobe is likely  infectious or inflammatory in etiology, given surrounding peribronchovascular nodularity, peribronchial thickening and mucoid impaction. Developing abscess or malignancy cannot be excluded. 3. Scattered peribronchovascular nodularity, peribronchial thickening and mucoid impaction, indicative of mycobacterium avium complex. 4. Tiny right pleural effusion. Electronically Signed   By: Leanna Battles M.D.   On: 07/15/2015 11:09   Mr Brain Wo Contrast  07/14/2015  CLINICAL DATA:  Stroke.  Headache. EXAM: MRI HEAD WITHOUT CONTRAST TECHNIQUE: Multiplanar, multiecho pulse sequences of the brain and surrounding structures were obtained without intravenous contrast. COMPARISON:  CT 07/14/2015 FINDINGS: Negative for acute infarct. No significant chronic ischemia Mild atrophy.  Negative for hydrocephalus. Negative for intracranial hemorrhage.  No fluid collection Negative for mass or edema.  No shift of the midline structures Pituitary normal.  Skull base normal. Mild mucosal edema paranasal sinuses.  Normal orbit bilaterally. IMPRESSION: Normal for age. Mild mucosal edema paranasal sinuses. Electronically Signed   By: Marlan Palau M.D.   On: 07/14/2015 16:41      Lab Results  Component Value Date   HGBA1C 6.3* 07/14/2015   Lab Results  Component Value Date   LDLCALC 125* 07/14/2015   CREATININE 0.73 07/16/2015       Scheduled Meds: . apixaban  2.5 mg Oral BID  . aspirin  325 mg Oral Daily  . doxycycline  100 mg Oral Q12H  . levothyroxine  50 mcg Oral QAC breakfast  . metoprolol tartrate  12.5 mg Oral BID  . penicillin v potassium  500 mg Oral BID   Continuous  Infusions:       Time spent: >30 MINS    [facility]  [facility] Pager [phone]. If 7PM-7AM, please contact night-coverage at [email or web address], password [email or web address] 07/16/2015, 1:29 PM

## 2015-07-16 NOTE — Progress Notes (Signed)
Pt. Was working with OT when she said she felt hot and dizzy and laid back on the bed, patient has has some PVC's is new diagnoses of A-fib, Dr. Susie CassetteAbrol texted and notified, awaiting orders from doctor.

## 2015-07-16 NOTE — Progress Notes (Signed)
Assisted patient OOB to the bathroom. Family member present as well. Patient able to walk and stand with one person assist. Falls precautions per policy in place. Patient and family understands to call the nursing staff for assistance out of bed. callbell in reach. Will continue to monitor.

## 2015-07-16 NOTE — Progress Notes (Signed)
Physical Therapy Treatment Patient Details Name: Caitlyn Henry MRN: [id number] DOB: [birth year] Today's Date: 07/16/2015    History of Present Illness Patient is a 82 y/o female with hx of anemia and stroke presents with left sided weakness, HA and word finding. Head MRI and CT negative. New onset A-fib.    PT Comments    Patient fatigued with treatment this session.  Feel will needs close S at least with ambulation at home especially as planned to start anticoagulation for A-Fib.  Do not feel dizziness currently from vestibular cause, but will continue assessments.    Follow Up Recommendations  Home health PT;Supervision/Assistance - 24 hour     Equipment Recommendations  None recommended by PT    Recommendations for Other Services       Precautions / Restrictions Precautions Precautions: Fall Precaution Comments: dizziness (light headedness)    Mobility  Bed Mobility   Bed Mobility: Supine to Sit     Supine to sit: Min guard     General bed mobility comments: pt using rail and increased time to come up, assist on leg for keeping weight shift forward with coming upright and scooting to EOB  Transfers   Equipment used: Rolling walker (2 wheeled) Transfers: Sit to/from Stand Sit to Stand: Min guard         General transfer comment: difficulty initially with supervision coming upright so gae minguard assist for balance/safety  Ambulation/Gait Ambulation/Gait assistance: Min assist Ambulation Distance (Feet): 180 Feet Assistive device: Rolling walker (2 wheeled) Gait Pattern/deviations: Step-through pattern;Decreased stride length;Narrow base of support;Trunk flexed;Drifts right/left     General Gait Details: generally unsteady and weak with ambulation with walker.  self limited distance   Stairs            Wheelchair Mobility    Modified Rankin (Stroke Patients Only)       Balance   Sitting-balance support: Feet supported Sitting  balance-Leahy Scale: Good     Standing balance support: Bilateral upper extremity supported Standing balance-Leahy Scale: Poor Standing balance comment: unsteady, needs UE support                    Cognition Arousal/Alertness: Awake/alert Behavior During Therapy: WFL for tasks assessed/performed Overall Cognitive Status: Difficult to assess Area of Impairment: Safety/judgement;Following commands;Problem solving     Memory: Decreased short-term memory Following Commands: Follows one step commands with increased time;Follows one step commands inconsistently Safety/Judgement: Decreased awareness of safety   Problem Solving: Slow processing;Requires verbal cues      Exercises      General Comments General comments (skin integrity, edema, etc.): noted ability to follow pen to test smooth pursuits with increased time and cues, no nystagmus noted, did have some catch up saccades with VOR, but subjective report of symptoms and onset as well as improvement point more to vasogenic or cardiogenic source for symptoms.  (though orthostatics not taken this session, encouraged pt to sit several moments on EOB prior to rising to allow adjustment to changes in position)      Pertinent Vitals/Pain Pain Assessment: No/denies pain  Filed Vitals:   07/16/15 1414 07/16/15 1442  BP: 139/80 151/74  Pulse: 84 98  Temp: 97.9 F (36.6 C)   Resp: 16          Home Living                      Prior Function  PT Goals (current goals can now be found in the care plan section) Progress towards PT goals: Progressing toward goals    Frequency  Min 3X/week    PT Plan Current plan remains appropriate    Co-evaluation             End of Session Equipment Utilized During Treatment: Gait belt Activity Tolerance: Patient tolerated treatment well Patient left: in chair;with chair alarm set     Time: 1329-1400 PT Time Calculation (min) (ACUTE ONLY): 31  min  Charges:  $Gait Training: 8-22 mins $Self Care/Home Management: 8-22                    G Codes:      Elray McgregorCynthia Reshonda Koerber 07/16/2015, 2:56 PM  Sheran Lawlessyndi Geovonni Meyerhoff, PT [phone] 07/16/2015

## 2015-07-16 NOTE — Progress Notes (Signed)
Occupational Therapy Treatment Patient Details Name: Caitlyn Henry MRN: [id number] DOB: 16-Jul-1923 Today's Date: 07/16/2015    History of present illness Patient is a 82 y/o female with hx of anemia and stroke presents with left sided weakness, HA and word finding. Head MRI and CT negative. New onset A-fib.   OT comments  Attempted to educate pt and daughter on fine motor coordination and strengthening exercises with use of theraputty. Upon sitting EOB for ~2 min; pt reported that she felt hot and weak requesting to lay back down. Pt denied dizziness. SpO2=96%, HR=mid 80s (RN notified). Upgraded d/c plan to [facility] for follow up in order to maximize independence and safety with ADLs and functional mobility. Will continue to follow acutely.   Follow Up Recommendations  Home health OT;Supervision/Assistance - 24 hour    Equipment Recommendations  None recommended by OT    Recommendations for Other Services      Precautions / Restrictions Precautions Precautions: Fall Precaution Comments: dizziness (light headedness) Restrictions Weight Bearing Restrictions: No       Mobility Bed Mobility Overal bed mobility: Needs Assistance Bed Mobility: Supine to Sit;Sit to Supine     Supine to sit: Min guard Sit to supine: Min guard   General bed mobility comments: HOB elevated with use of bed rails.   Transfers   Equipment used: Rolling walker (2 wheeled) Transfers: Sit to/from Stand Sit to Stand: [facility] transfer comment: difficulty initially with supervision coming upright so gae minguard assist for balance/safety    Balance Overall balance assessment: Needs assistance Sitting-balance support: Feet unsupported;No upper extremity supported Sitting balance-Leahy Scale: Fair     Standing balance support: Bilateral upper extremity supported Standing balance-Leahy Scale: Poor Standing balance comment: unsteady, needs UE support                   ADL  Overall ADL's : Needs assistance/impaired                                       General ADL Comments: Attempted fine motor coordination/strengthening exercises with theraputty sitting EOB. Within 2 minutes sitting EOB pt reported feeling very hot and weak; requesing to lay back down. Pt denied dizziness or lightheadedness. Pt returned to supine; SpO2=96% on RA, HR=mid 80s (RN notified).       Vision                     Perception     Praxis      Cognition   Behavior During Therapy: [facility] for tasks assessed/performed Overall Cognitive Status: Difficult to assess Area of Impairment: Safety/judgement;Following commands;Problem solving     Memory: Decreased short-term memory  Following Commands: Follows one step commands inconsistently;Follows one step commands with increased time Safety/Judgement: Decreased awareness of safety   Problem Solving: Slow processing;Decreased initiation;Requires verbal cues      Extremity/Trunk Assessment               Exercises     Shoulder Instructions       General Comments      Pertinent Vitals/ Pain       Pain Assessment: No/denies pain  Home Living  Prior Functioning/Environment              Frequency Min 2X/week     Progress Toward Goals  OT Goals(current goals can now be found in the care plan section)  Progress towards OT goals: Not progressing toward goals - comment  Acute Rehab OT Goals Patient Stated Goal: to feel better  Plan Discharge plan needs to be updated    Co-evaluation                 End of Session     Activity Tolerance Other (comment) (limited by lightheadedness)   Patient Left in bed;with call bell/phone within reach;with bed alarm set;with family/visitor present   Nurse Communication Other (comment) (pt feeling hot and needed to lay down; HR and SpO2)        Time: 1441-1500 OT Time Calculation  (min): 19 min  Charges: OT General Charges $OT Visit: 1 Procedure OT Treatments $Therapeutic Activity: 8-22 mins  Gaye AlkenBailey A Ember Gottwald M.S., OTR/L Pager: [phone]  07/16/2015, 3:35 PM

## 2015-07-16 NOTE — Progress Notes (Signed)
Patient Name: Caitlyn Henry Date of Encounter: 07/16/2015  Hospital Problem List     Principal Problem:   Left-sided weakness Active Problems:   Dizziness, nonspecific   Stroke [facility])   New onset atrial fibrillation (HCC)   Headache   Hypertension   Positive Lyme disease serology   Acute left-sided weakness   Hypothyroidism   Encounter for anticoagulation discussion and counseling   Cephalalgia    Subjective   Did well this morning, walked with PT without any issues. However when work with physical therapy she felt hot and dizzy when sitting up in the bed and had to lie down.  Orthostatics pending  Inpatient Medications    . apixaban  2.5 mg Oral BID  . aspirin  325 mg Oral Daily  . doxycycline  100 mg Oral Q12H  . levothyroxine  50 mcg Oral QAC breakfast  . metoprolol tartrate  25 mg Oral BID  . penicillin v potassium  500 mg Oral BID    Vital Signs    Filed Vitals:   07/16/15 0510 07/16/15 1007 07/16/15 1414 07/16/15 1442  BP: 136/84 176/66 139/80 151/74  Pulse: 86 89 84 98  Temp: 98 F (36.7 C) 98 F (36.7 C) 97.9 F (36.6 C)   TempSrc: Oral Oral Oral   Resp: Weight:      SpO2: 96% 98% 99%    No intake or output data in the 24 hours ending 07/16/15 1602 Filed Weights   07/15/15 1600  Weight: 111 lb (50.349 kg)    Physical Exam    General: Pleasant, NAD.Thin and frail. Neuro: Alert and oriented X 3. Moves all extremities spontaneously. Psych: Normal affect. HEENT:  Normal  Neck: Supple without bruits or JVD. Lungs:  Resp regular and unlabored, CTA. Heart: Irregularly irregular rhythm with controlled rate. Normal S1 and S2.. no s3, s4; 2/6 SEM at RUSB radiating to carotids. high pitched crescendo decrescendo. Also soft blowing HSM at the apex roughly 1/6. Nondisplaced PMI. Abdomen: Soft, non-tender, non-distended, BS + x 4.  Extremities: No clubbing, cyanosis or edema. DP/PT/Radials 2+ and equal bilaterally.  Labs     CBC  Recent Labs  07/14/15 1326 07/16/15 0522  WBC 7.6 6.3  NEUTROABS 5.4  --   HGB 13.5 11.4*  HCT 41.0 34.6*  MCV 89.3 89.2  PLT 214 189   Basic Metabolic Panel  Recent Labs  07/14/15 1326 07/16/15 0522  NA 134* 137  K 4.6 3.9  CL 98* 103  CO2 26 25  GLUCOSE 119* 99  BUN 19 16  CREATININE 0.92 0.73  CALCIUM 9.5 8.9   Liver Function Tests  Recent Labs  07/14/15 1326 07/16/15 0522  AST 25 18  ALT 19 14  ALKPHOS 51 37*  BILITOT 0.8 0.6  PROT 8.1 6.2*  ALBUMIN 4.3 3.3*   No results for input(s): LIPASE, AMYLASE in the last 72 hours. Cardiac Enzymes  Recent Labs  07/14/15 1912  TROPONINI <0.03   BNP Invalid input(s): POCBNP D-Dimer No results for input(s): DDIMER in the last 72 hours. Hemoglobin A1C  Recent Labs  07/14/15 1912  HGBA1C 6.3*   Fasting Lipid Panel  Recent Labs  07/14/15 1912  CHOL 202*  HDL 63  LDLCALC 125*  TRIG 70  CHOLHDL 3.2   Thyroid Function Tests  Recent Labs  07/14/15 1912  TSH 5.526*    Telemetry    Atrial fibrillation with frequent APCs/barely conducted beats. 2 second pauses noted.  ECG  No new EKG   Cardiology   Echocardiogram 07/15/2015: Moderate focal basal hypertrophy. EF 65-70%. Likely high filling pressures. Mild aortic stenosis. Severely calcified mitral annulus with only mild regurgitation.  Radiology    Reviewed   Assessment & Plan    Principal Problem:   Left-sided weakness Active Problems:   Dizziness, nonspecific   Stroke [facility](HCC)   New onset atrial fibrillation (HCC)   Headache   Hypertension   Positive Lyme disease serology   Acute left-sided weakness   Hypothyroidism   Encounter for anticoagulation discussion and counseling   Cephalalgia   We were consulted for new onset/new diagnosis of each of ablation in the setting of a TIA/stroke. The main question was about anticoagulation. We started Eliquis after long discussion. I also started low-dose beta blocker. She has  been doing fine up until just a few minutes ago when she was working with occupational therapy and had an episode of dizziness while sitting up in the bed. Reviewing internal medicine note it appears that they're waiting for my recommendations. The recommendations were to start low-dose beta blocker and Eliquis which has been done.   As for her dizziness. She appears to be almost hypertensive today, but would recommend checking orthostatics and have her ambulate again and all. I suspect she may just be a little bit dehydrated and orthostatic. This may also be related to the reason why she came in being a TIA.  Recommendation would be that if she does well in living in the hall with the nurse and has normal orthostatic vital signs, I would recommend discharge.  If she remains unstable and somewhat orthostatic, perhaps monitor overnight and reassess in the morning.  I will defer decision as far as discharge to the primary service. From a cardiac standpoint her heart rate is well controlled her blood pressure is relatively normal, provided she is not orthostatic. And she is now on Eliquis.  We will help arrange follow-up appointment in our clinic. Likely within [facility] Practice Provider (APP).. I will then see her in a couple months.  Please ensure that her discharge summary sent to her primary care provider.   Signed, Marykay LexHARDING, Maeve Debord W, M.D., M.S. Interventional Cardiologist   Pager # [phone] Phone # [phone] [address] [place], [place] [address]

## 2015-07-17 ENCOUNTER — Observation Stay (HOSPITAL_BASED_OUTPATIENT_CLINIC_OR_DEPARTMENT_OTHER): Payer: Medicare Other

## 2015-07-17 DIAGNOSIS — M6289 Other specified disorders of muscle: Secondary | ICD-10-CM | POA: Diagnosis not present

## 2015-07-17 DIAGNOSIS — R42 Dizziness and giddiness: Secondary | ICD-10-CM

## 2015-07-17 DIAGNOSIS — R51 Headache: Secondary | ICD-10-CM | POA: Diagnosis not present

## 2015-07-17 MED ORDER — METOPROLOL TARTRATE 25 MG PO TABS: 25.0000 mg | ORAL_TABLET | Freq: Two times a day (BID) | ORAL | Status: DC

## 2015-07-17 NOTE — Progress Notes (Signed)
VASCULAR LAB PRELIMINARY  PRELIMINARY  PRELIMINARY  PRELIMINARY  Carotid duplex completed.     Bilateral:  1-39% ICA stenosis.  Vertebral artery flow is antegrade.     Jenetta Logesami Adeline Petitfrere, RVT, RDMS 07/17/2015, 12:32 PM

## 2015-07-17 NOTE — Discharge Summary (Signed)
Physician Discharge Summary  Caitlyn Henry MRN: [id number] DOB/AGE: [birth year] 80 y.o.  PCP: Tamsen Roers, MD   Admit date: 07/14/2015 Discharge date: 07/17/2015  Discharge Diagnoses:     Principal Problem:   Left-sided weakness Active Problems:   Stroke [facility])   New onset atrial fibrillation (HCC)   Headache   Hypertension   Positive Lyme disease serology   Acute left-sided weakness   Hypothyroidism   Encounter for anticoagulation discussion and counseling   Cephalalgia   Dizziness, nonspecific   TIA due to embolism ([place])    Follow-up recommendations Follow-up with PCP in 3-5 days , including all  additional recommended appointments as below Follow-up CBC, CMP in 3-5 days Patient is being discharged with home health Patient needs to follow-up with cardiology for further management of her atrial fibrillation      Current Discharge Medication List    START taking these medications   Details  apixaban (ELIQUIS) 2.5 MG TABS tablet Take 1 tablet (2.5 mg total) by mouth 2 (two) times daily. Qty: 60 tablet, Refills: 1    doxycycline (VIBRA-TABS) 100 MG tablet Take 1 tablet (100 mg total) by mouth every 12 (twelve) hours. Qty: 20 tablet, Refills: 0    metoprolol tartrate (LOPRESSOR) 25 MG tablet Take 1 tablet (25 mg total) by mouth 2 (two) times daily. Qty: 60 tablet, Refills: 1      CONTINUE these medications which have NOT CHANGED   Details  ferrous gluconate (FERGON) 240 (27 FE) MG tablet Take 240 mg by mouth daily. Refills: 6    levothyroxine (SYNTHROID, LEVOTHROID) 50 MCG tablet Take 50 mcg by mouth daily. Refills: 12    penicillin v potassium (VEETID) 500 MG tablet Take 500 mg by mouth 2 (two) times daily. Refills: 0    Vitamin D, Ergocalciferol, (DRISDOL) 50000 units CAPS capsule Take 50,000 Units by mouth once a week. Refills: 12      STOP taking these medications     doxycycline (ADOXA) 50 MG tablet          Discharge  Condition:Stable    Discharge Instructions Get Medicines reviewed and adjusted: Please take all your medications with you for your next visit with your Primary MD  Please request your Primary MD to go over all hospital tests and procedure/radiological results at the follow up, please ask your Primary MD to get all Hospital records sent to his/her office.  If you experience worsening of your admission symptoms, develop shortness of breath, life threatening emergency, suicidal or homicidal thoughts you must seek medical attention immediately by calling 911 or calling your MD immediately if symptoms less severe.  You must read complete instructions/literature along with all the possible adverse reactions/side effects for all the Medicines you take and that have been prescribed to you. Take any new Medicines after you have completely understood and accpet all the possible adverse reactions/side effects.   Do not drive when taking Pain medications.   Do not take more than prescribed Pain, Sleep and Anxiety Medications  Special Instructions: If you have smoked or chewed Tobacco in the last 2 yrs please stop smoking, stop any regular Alcohol and or any Recreational drug use.  Wear Seat belts while driving.  Please note  You were cared for by a hospitalist during your hospital stay. Once you are discharged, your primary care physician will handle any further medical issues. Please note that NO REFILLS for any discharge medications will be authorized once you are discharged, as it is imperative that you  return to your primary care physician (or establish a relationship with a primary care physician if you do not have one) for your aftercare needs so that they can reassess your need for medications and monitor your lab values.  Discharge Instructions    Diet - low sodium heart healthy    Complete by:  As directed      Increase activity slowly    Complete by:  As directed              Allergies  Allergen Reactions  . Tylenol [Acetaminophen] Other (See Comments)    Makes my body feel numb       Disposition: 01-Home or Self Care   Consults:  Cardiology Neurology     Significant Diagnostic Studies:  Ct Angio Head W/cm &/or Wo Cm  07/14/2015  CLINICAL DATA:  82 year old female with altered mental status and difficulty finding words. Headache. Subsequent encounter. EXAM: CT ANGIOGRAPHY HEAD AND NECK TECHNIQUE: Multidetector CT imaging of the head and neck was performed using the standard protocol during bolus administration of intravenous contrast. Multiplanar CT image reconstructions and MIPs were obtained to evaluate the vascular anatomy. Carotid stenosis measurements (when applicable) are obtained utilizing NASCET criteria, using the distal internal carotid diameter as the denominator. CONTRAST:  50 cc Isovue 370. COMPARISON:  07/14/2015 brain MR and head CT. FINDINGS: CT HEAD Brain: No intracranial hemorrhage or CT evidence of large acute infarct. No intracranial mass or abnormal enhancement. Global atrophy without hydrocephalus. Small vessel disease changes. Calvarium and skull base: Negative. Paranasal sinuses: Clear. Orbits: Negative. CTA NECK Aortic arch: 3 vessel arch. Right carotid system: Mild plaque right carotid bifurcation without significant narrowing. Left carotid system: Mild plaque origin left common carotid artery. Plaque proximal left internal carotid artery without significant stenosis. Vertebral arteries:No significant stenosis of the vertebral arteries. Skeleton: No osseous destructive lesion. Cervical spondylotic changes without high-grade spinal stenosis. Other neck: No worrisome primary neck mass. Upper chest: Lung parenchymal changes most notable posterior aspect right upper lobe with there is a 2 cm cavitary pleural-based lesion. CT of the chest recommended for further delineation. This can be performed without contrast as patient has had contrast  for the current exam. CTA HEAD Anterior circulation: Calcified internal carotid artery cavernous segment with mild moderate narrowing bilaterally. Mild moderate narrowing supraclinoid aspect left internal carotid artery. No significant stenosis M1 segment of either middle cerebral artery. Middle cerebral artery mild to moderate branch vessel narrowing and irregularity. Mild moderate narrowing A1 segment left anterior cerebral artery. A2 segment anterior cerebral artery mild branch vessel irregularity bilaterally. Posterior circulation: Left vertebral artery is dominant. Mild tandem stenosis of the distal left vertebral artery. Mild narrowing distal right vertebral artery. Mild narrowing irregularity basilar artery without high-grade stenosis. Moderate to marked narrowing P1 -2 junction posterior cerebral artery bilaterally. Mild moderate narrowing distal posterior cerebral artery branches bilaterally. Venous sinuses: Patent. Anatomic variants: Negative. Delayed phase: Negative. IMPRESSION: CT HEAD No intracranial hemorrhage or CT evidence of large acute infarct. No intracranial mass or abnormal enhancement. Global atrophy without hydrocephalus. Small vessel disease changes. CTA NECK No evidence of hemodynamically significant stenosis involving either carotid artery or vertebral artery. Lung parenchymal changes most notable posterior aspect right upper lobe where there is a 2 cm cavitary pleural-based lesion. CT of the chest recommended for further delineation. This can be performed without contrast as patient has had contrast for the current exam. CTA HEAD Calcified internal carotid artery cavernous segment with mild to moderate narrowing bilaterally. Mild  to moderate narrowing supraclinoid aspect left internal carotid artery. No significant stenosis M1 segment of either middle cerebral artery. Middle cerebral artery mild to moderate branch vessel narrowing and irregularity bilaterally. Mild to moderate narrowing A1  segment left anterior cerebral artery. A2 segment anterior cerebral artery mild branch vessel irregularity bilaterally. Left vertebral artery is dominant. Mild tandem stenosis of the distal left vertebral artery. Mild narrowing distal right vertebral artery. Mild narrowing and irregularity basilar artery without high-grade stenosis. Moderate to marked narrowing P1 -2 junction posterior cerebral artery bilaterally. Mild to moderate narrowing distal posterior cerebral artery branches bilaterally. Electronically Signed   By: Genia Del M.D.   On: 07/14/2015 17:03   Dg Chest 2 View  07/14/2015  CLINICAL DATA:  Stroke. EXAM: CHEST  2 VIEW COMPARISON:  None. FINDINGS: The heart size and mediastinal contours are within normal limits. No pneumothorax or pleural effusion is noted. Hyperexpansion of the lungs is noted suggesting chronic obstructive pulmonary disease. Atherosclerosis of thoracic aorta is noted. Left lung is clear. Ill-defined density is noted in right upper lobe which may represent inflammation, but there is noted nodular density peripherally in right upper lobe. The visualized skeletal structures are unremarkable. IMPRESSION: Ill-defined density seen in right upper lobe which may represent inflammation or possibly pneumonia. However, small nodular density is noted laterally in right upper lobe concerning for possible neoplasm. CT scan of the chest with intravenous contrast is recommended for further evaluation. Electronically Signed   By: Marijo Conception, M.D.   On: 07/14/2015 16:55   Ct Head Wo Contrast  07/14/2015  CLINICAL DATA:  Severe headache. EXAM: CT HEAD WITHOUT CONTRAST TECHNIQUE: Contiguous axial images were obtained from the base of the skull through the vertex without intravenous contrast. COMPARISON:  None. FINDINGS: Bony calvarium appears intact. Mild diffuse cortical atrophy is noted. Mild chronic ischemic white matter disease is noted. No mass effect or midline shift is noted.  Ventricular size is within normal limits. There is no evidence of mass lesion, hemorrhage or acute infarction. IMPRESSION: Mild diffuse cortical atrophy. Mild chronic ischemic white matter disease. No acute intracranial abnormality seen. Electronically Signed   By: Marijo Conception, M.D.   On: 07/14/2015 13:15   Ct Angio Neck W/cm &/or Wo/cm  07/14/2015  CLINICAL DATA:  82 year old female with altered mental status and difficulty finding words. Headache. Subsequent encounter. EXAM: CT ANGIOGRAPHY HEAD AND NECK TECHNIQUE: Multidetector CT imaging of the head and neck was performed using the standard protocol during bolus administration of intravenous contrast. Multiplanar CT image reconstructions and MIPs were obtained to evaluate the vascular anatomy. Carotid stenosis measurements (when applicable) are obtained utilizing NASCET criteria, using the distal internal carotid diameter as the denominator. CONTRAST:  50 cc Isovue 370. COMPARISON:  07/14/2015 brain MR and head CT. FINDINGS: CT HEAD Brain: No intracranial hemorrhage or CT evidence of large acute infarct. No intracranial mass or abnormal enhancement. Global atrophy without hydrocephalus. Small vessel disease changes. Calvarium and skull base: Negative. Paranasal sinuses: Clear. Orbits: Negative. CTA NECK Aortic arch: 3 vessel arch. Right carotid system: Mild plaque right carotid bifurcation without significant narrowing. Left carotid system: Mild plaque origin left common carotid artery. Plaque proximal left internal carotid artery without significant stenosis. Vertebral arteries:No significant stenosis of the vertebral arteries. Skeleton: No osseous destructive lesion. Cervical spondylotic changes without high-grade spinal stenosis. Other neck: No worrisome primary neck mass. Upper chest: Lung parenchymal changes most notable posterior aspect right upper lobe with there is a 2 cm cavitary  pleural-based lesion. CT of the chest recommended for further  delineation. This can be performed without contrast as patient has had contrast for the current exam. CTA HEAD Anterior circulation: Calcified internal carotid artery cavernous segment with mild moderate narrowing bilaterally. Mild moderate narrowing supraclinoid aspect left internal carotid artery. No significant stenosis M1 segment of either middle cerebral artery. Middle cerebral artery mild to moderate branch vessel narrowing and irregularity. Mild moderate narrowing A1 segment left anterior cerebral artery. A2 segment anterior cerebral artery mild branch vessel irregularity bilaterally. Posterior circulation: Left vertebral artery is dominant. Mild tandem stenosis of the distal left vertebral artery. Mild narrowing distal right vertebral artery. Mild narrowing irregularity basilar artery without high-grade stenosis. Moderate to marked narrowing P1 -2 junction posterior cerebral artery bilaterally. Mild moderate narrowing distal posterior cerebral artery branches bilaterally. Venous sinuses: Patent. Anatomic variants: Negative. Delayed phase: Negative. IMPRESSION: CT HEAD No intracranial hemorrhage or CT evidence of large acute infarct. No intracranial mass or abnormal enhancement. Global atrophy without hydrocephalus. Small vessel disease changes. CTA NECK No evidence of hemodynamically significant stenosis involving either carotid artery or vertebral artery. Lung parenchymal changes most notable posterior aspect right upper lobe where there is a 2 cm cavitary pleural-based lesion. CT of the chest recommended for further delineation. This can be performed without contrast as patient has had contrast for the current exam. CTA HEAD Calcified internal carotid artery cavernous segment with mild to moderate narrowing bilaterally. Mild to moderate narrowing supraclinoid aspect left internal carotid artery. No significant stenosis M1 segment of either middle cerebral artery. Middle cerebral artery mild to moderate  branch vessel narrowing and irregularity bilaterally. Mild to moderate narrowing A1 segment left anterior cerebral artery. A2 segment anterior cerebral artery mild branch vessel irregularity bilaterally. Left vertebral artery is dominant. Mild tandem stenosis of the distal left vertebral artery. Mild narrowing distal right vertebral artery. Mild narrowing and irregularity basilar artery without high-grade stenosis. Moderate to marked narrowing P1 -2 junction posterior cerebral artery bilaterally. Mild to moderate narrowing distal posterior cerebral artery branches bilaterally. Electronically Signed   By: Genia Del M.D.   On: 07/14/2015 17:03   Ct Angio Chest Pe W/cm &/or Wo Cm  07/15/2015  CLINICAL DATA:  Shortness of breath, right upper lobe lesion, abnormal chest radiograph. EXAM: CT ANGIOGRAPHY CHEST WITH CONTRAST TECHNIQUE: Multidetector CT imaging of the chest was performed using the standard protocol during bolus administration of intravenous contrast. Multiplanar CT image reconstructions and MIPs were obtained to evaluate the vascular anatomy. CONTRAST:  75 cc Isovue 370. COMPARISON:  CT neck 07/14/2015. FINDINGS: Mediastinum/Nodes: Negative for pulmonary embolus. No pathologically enlarged mediastinal lymph nodes. Bi hilar lymphoid tissue. No axillary adenopathy. Heart is mildly enlarged. No pericardial effusion. Lungs/Pleura: Biapical pleural parenchymal scarring. Patchy diffuse peribronchovascular nodularity and peribronchial thickening. Slightly thick-walled cavitary lesion in posterior right upper lobe measures 1.7 x 1.8 cm (series 6, image 35). There is surrounding concentrated peribronchovascular nodularity, bronchial thickening and mucoid impaction. Tiny right pleural effusion. Airway is otherwise unremarkable. Upper abdomen: Visualized portions of the liver, adrenal glands, kidneys, spleen, pancreas and stomach are grossly unremarkable. Musculoskeletal: No worrisome lytic or sclerotic lesions.  Review of the MIP images confirms the above findings. IMPRESSION: 1. Negative for pulmonary embolus. 2. Cavitary lesion in the posterior right upper lobe is likely infectious or inflammatory in etiology, given surrounding peribronchovascular nodularity, peribronchial thickening and mucoid impaction. Developing abscess or malignancy cannot be excluded. 3. Scattered peribronchovascular nodularity, peribronchial thickening and mucoid impaction, indicative of mycobacterium avium complex.  4. Tiny right pleural effusion. Electronically Signed   By: Lorin Picket M.D.   On: 07/15/2015 11:09   Mr Brain Wo Contrast  07/14/2015  CLINICAL DATA:  Stroke.  Headache. EXAM: MRI HEAD WITHOUT CONTRAST TECHNIQUE: Multiplanar, multiecho pulse sequences of the brain and surrounding structures were obtained without intravenous contrast. COMPARISON:  CT 07/14/2015 FINDINGS: Negative for acute infarct. No significant chronic ischemia Mild atrophy.  Negative for hydrocephalus. Negative for intracranial hemorrhage.  No fluid collection Negative for mass or edema.  No shift of the midline structures Pituitary normal.  Skull base normal. Mild mucosal edema paranasal sinuses.  Normal orbit bilaterally. IMPRESSION: Normal for age. Mild mucosal edema paranasal sinuses. Electronically Signed   By: Franchot Gallo M.D.   On: 07/14/2015 16:41     2-D echo LV EF: 65% - 70%  ------------------------------------------------------------------- Indications: CVA 436.  ------------------------------------------------------------------- History: PMH: Atrial fibrillation. Risk factors: Hypertension.  ------------------------------------------------------------------- Study Conclusions  - Left ventricle: The cavity size was normal. There was moderate  focal basal hypertrophy of the septum. Systolic function was  vigorous. The estimated ejection fraction was in the range of 65%  to 70%. Wall motion was normal; there  were no regional wall  motion abnormalities. Doppler parameters are consistent with high  ventricular filling pressure. - Aortic valve: Valve mobility was restricted. There was very mild  stenosis. Peak velocity (S): 223 cm/s. Mean gradient (S): 11 mm  Hg. - Mitral valve: Severely calcified annulus. Mildly thickened  leaflets . There was mild regurgitation. - Left atrium: The atrium was moderately dilated. Volume/bsa, ES,  (1-plane Simpson&'s, A2C): 51.7 ml/m^2.  Impressions:  - No cardiac source of emboli was indentified.   Filed Weights   07/15/15 1600  Weight: 50.349 kg (111 lb)     Microbiology: Recent Results (from the past 240 hour(s))  Urine culture     Status: Abnormal   Collection Time: 07/14/15  3:19 PM  Result Value Ref Range Status   Specimen Description URINE, CLEAN CATCH  Final   Special Requests NONE  Final   Culture MULTIPLE SPECIES PRESENT, SUGGEST RECOLLECTION (A)  Final   Report Status 07/15/2015 FINAL  Final       Blood Culture    Component Value Date/Time   SDES URINE, CLEAN CATCH 07/14/2015 1519   SPECREQUEST NONE 07/14/2015 1519   CULT MULTIPLE SPECIES PRESENT, SUGGEST RECOLLECTION* 07/14/2015 1519   REPTSTATUS 07/15/2015 FINAL 07/14/2015 1519      Labs: Results for orders placed or performed during the hospital encounter of 07/14/15 (from the past 48 hour(s))  CBC     Status: Abnormal   Collection Time: 07/16/15  5:22 AM  Result Value Ref Range   WBC 6.3 4.0 - 10.5 K/uL   RBC 3.88 3.87 - 5.11 MIL/uL   Hemoglobin 11.4 (L) 12.0 - 15.0 g/dL   HCT 34.6 (L) 36.0 - 46.0 %   MCV 89.2 78.0 - 100.0 fL   MCH 29.4 26.0 - 34.0 pg   MCHC 32.9 30.0 - 36.0 g/dL   RDW 13.8 11.5 - 15.5 %   Platelets 189 150 - 400 K/uL  Comprehensive metabolic panel     Status: Abnormal   Collection Time: 07/16/15  5:22 AM  Result Value Ref Range   Sodium 137 135 - 145 mmol/L   Potassium 3.9 3.5 - 5.1 mmol/L   Chloride 103 101 - 111 mmol/L   CO2 25 22 -  32 mmol/L   Glucose, Bld 99 65 - 99 mg/dL  BUN 16 6 - 20 mg/dL   Creatinine, Ser 0.73 0.44 - 1.00 mg/dL   Calcium 8.9 8.9 - 10.3 mg/dL   Total Protein 6.2 (L) 6.5 - 8.1 g/dL   Albumin 3.3 (L) 3.5 - 5.0 g/dL   AST 18 15 - 41 U/L   ALT 14 14 - 54 U/L   Alkaline Phosphatase 37 (L) 38 - 126 U/L   Total Bilirubin 0.6 0.3 - 1.2 mg/dL   GFR calc non Af Amer >60 >60 mL/min   GFR calc Af Amer >60 >60 mL/min    Comment: (NOTE) The eGFR has been calculated using the CKD EPI equation. This calculation has not been validated in all clinical situations. eGFR's persistently <60 mL/min signify possible Chronic Kidney Disease.    Anion gap 9 5 - 15  T4, free     Status: None   Collection Time: 07/16/15  5:22 AM  Result Value Ref Range   Free T4 1.06 0.61 - 1.12 ng/dL     Lipid Panel     Component Value Date/Time   CHOL 202* 07/14/2015 1912   TRIG 70 07/14/2015 1912   HDL 63 07/14/2015 1912   CHOLHDL 3.2 07/14/2015 1912   VLDL 14 07/14/2015 1912   LDLCALC 125* 07/14/2015 1912     Lab Results  Component Value Date   HGBA1C 6.3* 07/14/2015     Lab Results  Component Value Date   LDLCALC 125* 07/14/2015   CREATININE 0.73 07/16/2015     HPI : 82 y.o. female with past medical history of anemia, hypothyroidism, and recently diagnosed Lyme Disease who presented to [facility] ED on 07/14/2015 for evaluation of a new onset headache, nausea, and altered mental status.   CT Head showed mild diffuse cortical atrophy and chronic ischemic white matter disease but no acute intracranial abnormalities were noted. MRI Brain showed no evidence of acute infarct. Her symptoms have resolved within 24 hours, so TIA is now the working diagnosis of her symptoms with negative imaging.  On arrival, she was noted to be in atrial fibrillation with a HR of 93. In reviewing telemetry, she has remained in atrial fibrillation this admission, rate-controlled in the 70's - 80's. WBC normal at 7.6. Hgb 13.5.  Platelets 214. Creatinine 0.92. Troponin negative. Lipid panel showed LDL elevated to 125, total of 202. TSH elevated to 5.526.   An echocardiogram was performed today which showed moderate focal basal hypertrophy of the septum, an EF of 65% to 70%, normal wall motion, mild AS, and mild MR. The left atrium was moderately dilated.     HOSPITAL COURSE Left-sided weakness/headache. CT of the head without acute abnormality. MRI of the brain negative for acute infarct, normal for age per report. CTA neck No evidence of hemodynamically significant stenosis involving either carotid artery or vertebral artery. CTA head with mild to moderate narrowing anteriorly and mild narrowing posteriorly. Evaluated by neurology in the emergency department recommends admission for stroke workup. Patient outside TPA window.   telemetry-continues to show atrial fibrillation   2-D echo shows EF of 65-70%, LA moderately dilated chest x-ray shows possible pneumonia -right upper lobe density concerning for neoplasm,  TSH 5.5, free T4 within normal limits hemoglobin A1c 6.3, lipid panel - LDL 125 she passed bedside swallow eval -Physical therapy/occupational therapy-recommended home health -Frequent neuro checks stable -Statin and aspirin On Eliquis for new onset atrial fibrillation  New onset atrial fibrillation rate controlled. EKG as noted above. Initial troponin negative. INR 1.07 [place] vasc  score 3 -Discontinued aspirin, 2-D echo as above -Cycle troponin -Repeat EKG in the morning Cardiology consultation for new onset A. fib, started on Eliquis and metoprolol, Rate controlled at rest,   Dr. Ellyn Hack managing metoprolol dosage, currently on metoprolol 25 mg twice a day Cardiology to arrange for follow-up   Hypertension. History of same. Blood pressure 172/93 on admission. -Monitor closely -Allow for permissive hypertension -When necessary hydralazine    Positive Lyme disease serology reportedly. Recent  started on doxycycline and penicillin V. Daughter reports she is 2 weeks into a three-week regimen -We'll continue these antibiotics   Right upper lobe nodule CT scan with contrast to rule out malignancy, pneumonia CT scan negative for pulmonary embolism patient has a cavitary lesion in the posterior right upper lobe, differential diagnosis could include Mycobacterium avium complex vs malignancy Discussed with Dr. Johnnye Sima, check expectorated sputum for MAC , family would not want a bronch      Discharge Exam: *   Blood pressure 187/88, pulse 78, temperature 98 F (36.7 C), temperature source Oral, resp. rate 18, weight 50.349 kg (111 lb), SpO2 98 %.  General: Pleasant, NAD.Thin and frail. Neuro: Alert and oriented X 3. Moves all extremities spontaneously. Psych: Normal affect. HEENT: Normal Neck: Supple without bruits or JVD. Lungs: Resp regular and unlabored, CTA. Heart: Irregularly irregular rhythm with controlled rate. Normal S1 and S2.. no s3, s4; 2/6 SEM at RUSB radiating to carotids. high pitched crescendo decrescendo. Also soft blowing HSM at the apex roughly 1/6. Nondisplaced PMI. Abdomen: Soft, non-tender, non-distended, BS + x 4.  Extremities: No clubbing, cyanosis or edema. DP/PT/Radials 2+ and equal bilaterally.    Follow-up Information    Follow up with Erlene Quan, PA-C On 2020-02-1516.   Specialties:  Cardiology, Radiology   Why:  @ 11am    Contact information:   [address] [place] [address] [phone]       Follow up with Tamsen Roers, MD. Schedule an appointment as soon as possible for a visit in 3 days.   Specialty:  Family Medicine   Why:  Hospital follow-up   Contact information:   [address] [place] [address] [phone]       Signed: Reyne Dumas 07/17/2015, 12:07 PM        Time spent >45 mins

## 2015-07-17 NOTE — Care Management Note (Signed)
Case Management Note  Patient Details  Name: Caitlyn Henry MRN: [id number] Date of Birth: 1923-09-18  Subjective/Objective:    Patient is a 82 y/o female with hx of anemia and stroke admitted 07/14/2015 with left sided weakness. New onset A-fib.  PT recommending HH services which I offered to the pt and her daughter. Both agree she does not want or need these services.               Action/Plan: Made the pt aware that I would put all contact information in the chart so that they can contact [facility] if they should get home and have a change in heart. No further CM needs at this time.    Expected Discharge Date:                  Expected Discharge Plan:     In-House Referral:     Discharge planning Services  CM Consult  Post Acute Care Choice:  Home Health Choice offered to:  Patient, Adult Children  DME Arranged:    DME Agency:     HH Arranged:    HH Agency:     Status of Service:  Completed, signed off  Medicare Important Message Given:    Date Medicare IM Given:    Medicare IM give by:    Date Additional Medicare IM Given:    Additional Medicare Important Message give by:     If discussed at Long Length of Stay Meetings, dates discussed:    Additional Comments:  Yvone NeuCrutchfield, Derion Kreiter M, RN 07/17/2015, 1:39 PM

## 2015-07-17 NOTE — Progress Notes (Signed)
Pt for discharge home today. Discharge orders received. IV and telemetry dcd with dressing clean dry and intact. Discharge instructions and prescriptions given with verbalized understanding. Family at bedside to assist with discharge. Staff brought patient to lobby via wheelchair at this time. Transported to home by family member.

## 2015-07-20 LAB — QUANTIFERON IN TUBE
QFT TB AG MINUS NIL VALUE: 0 [IU]/mL
QUANTIFERON MITOGEN VALUE: 2.96 [IU]/mL
QUANTIFERON NIL VALUE: 0.02 [IU]/mL
QUANTIFERON TB AG VALUE: 0.02 [IU]/mL
QUANTIFERON TB GOLD: NEGATIVE

## 2015-07-20 LAB — QUANTIFERON TB GOLD ASSAY (BLOOD)

## 2015-07-29 ENCOUNTER — Ambulatory Visit (INDEPENDENT_AMBULATORY_CARE_PROVIDER_SITE_OTHER): Payer: Medicare Other | Admitting: Cardiology

## 2015-07-29 ENCOUNTER — Encounter: Payer: Self-pay | Admitting: Cardiology

## 2015-07-29 VITALS — BP 144/60 | HR 61 | Ht 65.0 in | Wt 108.8 lb

## 2015-07-29 DIAGNOSIS — I10 Essential (primary) hypertension: Secondary | ICD-10-CM

## 2015-07-29 DIAGNOSIS — E039 Hypothyroidism, unspecified: Secondary | ICD-10-CM

## 2015-07-29 DIAGNOSIS — Z7901 Long term (current) use of anticoagulants: Secondary | ICD-10-CM | POA: Insufficient documentation

## 2015-07-29 DIAGNOSIS — G459 Transient cerebral ischemic attack, unspecified: Secondary | ICD-10-CM | POA: Diagnosis not present

## 2015-07-29 DIAGNOSIS — I4891 Unspecified atrial fibrillation: Secondary | ICD-10-CM

## 2015-07-29 DIAGNOSIS — I639 Cerebral infarction, unspecified: Secondary | ICD-10-CM

## 2015-07-29 DIAGNOSIS — I749 Embolism and thrombosis of unspecified artery: Secondary | ICD-10-CM

## 2015-07-29 MED ORDER — APIXABAN 2.5 MG PO TABS: 2.5000 mg | ORAL_TABLET | Freq: Two times a day (BID) | ORAL | Status: DC

## 2015-07-29 MED ORDER — METOPROLOL TARTRATE 25 MG PO TABS: ORAL_TABLET | ORAL | Status: DC

## 2015-07-29 NOTE — Assessment & Plan Note (Signed)
07/14/15- anticoagulation started

## 2015-07-29 NOTE — Progress Notes (Signed)
2020-05-3115 Caitlyn AlaminVirginia Henry   1923-09-28  [id number]  Primary Physician Aida PufferLITTLE,JAMES, MD Primary Cardiologist: Dr Herbie BaltimoreHarding  HPI:  Cachectic 82 y/o female seen in the ED 07/14/15 with complaints of weakness and "slow to respond". She has not had prior cardiac problems. She does have a recent diagnosis of Lyme Disease which has been treated. She was noted to be in AF. Echo showed normal LVF with moderate LAE. Work up was negative for stroke and the working diagnosis was embolic TIA.  She was placed on Eliquis 2.5 mg BID and Lopressor 25 mg BID.  She is seen in the office today for follow up. Overall she is doing well but she admits she feels generally "weak". No syncope or pre syncope. She remains unaware of her AF.    Current Outpatient Prescriptions  Medication Sig Dispense Refill  . apixaban (ELIQUIS) 2.5 MG TABS tablet Take 1 tablet (2.5 mg total) by mouth 2 (two) times daily. 60 tablet 6  . doxycycline (VIBRA-TABS) 100 MG tablet Take 1 tablet (100 mg total) by mouth every 12 (twelve) hours. 20 tablet 0  . ferrous gluconate (FERGON) 240 (27 FE) MG tablet Take 240 mg by mouth daily.  6  . levothyroxine (SYNTHROID, LEVOTHROID) 50 MCG tablet Take 50 mcg by mouth daily.  12  . penicillin v potassium (VEETID) 500 MG tablet Take 500 mg by mouth 2 (two) times daily.  0  . Vitamin D, Ergocalciferol, (DRISDOL) 50000 units CAPS capsule Take 50,000 Units by mouth once a week.  12  . metoprolol tartrate (LOPRESSOR) 25 MG tablet Take 1/2 tablet twice a day 30 tablet 6   No current facility-administered medications for this visit.    Allergies  Allergen Reactions  . Tylenol [Acetaminophen] Other (See Comments)    Makes my body feel numb     Social History   Social History  . Marital Status: Married    Spouse Name: N/A  . Number of Children: N/A  . Years of Education: N/A   Occupational History  . Not on file.   Social History Main Topics  . Smoking status: Never Smoker   . Smokeless  tobacco: Not on file  . Alcohol Use: Yes     Comment: rarely  . Drug Use: No  . Sexual Activity: Not on file   Other Topics Concern  . Not on file   Social History Narrative     Review of Systems: General: negative for chills, fever, night sweats or weight changes.  Cardiovascular: negative for chest pain, dyspnea on exertion, edema, orthopnea, palpitations, paroxysmal nocturnal dyspnea or shortness of breath Dermatological: negative for rash Respiratory: negative for cough or wheezing Urologic: negative for hematuria Abdominal: negative for nausea, vomiting, diarrhea, bright red blood per rectum, melena, or hematemesis Neurologic: negative for visual changes, syncope, or dizziness All other systems reviewed and are otherwise negative except as noted above.    Blood pressure 144/60, pulse 61, height 5\' 5"  (1.651 m), weight 108 lb 12.8 oz (49.351 kg).  General appearance: alert, cooperative, cachectic and no distress Neck: no carotid bruit and no JVD Lungs: clear to auscultation bilaterally Heart: irregularly irregular rhythm Extremities: no edema Neurologic: Grossly normal  EKG AF-VR 58  ASSESSMENT AND PLAN:   New onset atrial fibrillation (HCC) Unknown duration, asymptomatic  TIA due to embolism (HCC) 07/14/15- anticoagulation started  Chronic anticoagulation Eliquis 2.5 mg BID (age 82, wgt 49 kg)  Hypothyroidism On Synthroid   PLAN  I suggested she decrease her  Lopressor to 12.5 mg BID secondary to low HR and complaints of fatigue. F/U with Dr Dorethea Clan in 3 months.   Corine Shelter K PA-C 2020-04-2515 2:06 PM

## 2015-07-29 NOTE — Assessment & Plan Note (Signed)
Eliquis 2.5 mg BID (age 82, wgt 49 kg)

## 2015-07-29 NOTE — Assessment & Plan Note (Signed)
On Synthroid

## 2015-07-29 NOTE — Patient Instructions (Signed)
Decrease Metoprolol to 12.5 mg twice a day   Your physician recommends that you schedule a follow-up appointment in: 3 months with Dr.Harding

## 2015-07-29 NOTE — Assessment & Plan Note (Signed)
Unknown duration, asymptomatic 

## 2015-11-18 ENCOUNTER — Ambulatory Visit (INDEPENDENT_AMBULATORY_CARE_PROVIDER_SITE_OTHER): Payer: Medicare Other | Admitting: Cardiology

## 2015-11-18 ENCOUNTER — Encounter: Payer: Self-pay | Admitting: Cardiology

## 2015-11-18 VITALS — BP 127/70 | HR 83 | Ht 65.0 in | Wt 109.2 lb

## 2015-11-18 DIAGNOSIS — I639 Cerebral infarction, unspecified: Secondary | ICD-10-CM | POA: Diagnosis not present

## 2015-11-18 DIAGNOSIS — G459 Transient cerebral ischemic attack, unspecified: Secondary | ICD-10-CM | POA: Diagnosis not present

## 2015-11-18 DIAGNOSIS — I350 Nonrheumatic aortic (valve) stenosis: Secondary | ICD-10-CM

## 2015-11-18 DIAGNOSIS — I749 Embolism and thrombosis of unspecified artery: Secondary | ICD-10-CM

## 2015-11-18 DIAGNOSIS — I10 Essential (primary) hypertension: Secondary | ICD-10-CM

## 2015-11-18 DIAGNOSIS — I481 Persistent atrial fibrillation: Secondary | ICD-10-CM | POA: Diagnosis not present

## 2015-11-18 DIAGNOSIS — I4819 Other persistent atrial fibrillation: Secondary | ICD-10-CM

## 2015-11-18 NOTE — Progress Notes (Signed)
PCP: Aida Puffer, MD  Clinic Note: Chief Complaint  Patient presents with  . Follow-up    Atrial fibrillation    HPI: Caitlyn Henry is a 82 y.o. female with a PMH below who presents today for Her second post hospital follow-up after new diagnosis of A. Fib as a 3 month follow-up. She was admitted to [facility] on 07/14/2015 with weakness and decreased responsiveness. Apparently she carries a diagnosis of Lyme disease that has been treated but still she has symptoms. When she resented she is noted to be in A. fib. The working diagnosis was that she had an embolic TIA related to her A. fib. She was relatively unaware of being in A. fib at that time. He continues to be unaware..  Caitlyn was last seen on 06/29/2015 by Corine Shelter, PA-C. She was doing relatively well at that time. Tolerating ELIQUIS and metoprolol at that time. At that time her Lopressor was reduced to 12.5 mg twice a day for fatigue.  Recent Hospitalizations: None since April  Studies Reviewed: TSH updated.  2-D echocardiogram April 2017: Moderate basal hypertrophy. Vigorous function with EF 65-70%. Mild aortic stenosis with mean gradient 11 mmHg. Severe MAC with mild MR. Moderate LA dilation.  Interval History: I have not seen Caitlyn since she was in the hospital with new onset A. fib. She really doesn't notice being in atrial fibrillation but will indicate that her heart rate goes up when she exerts herself. Otherwise she has occasional exertional dyspnea. No lightheadedness or dizziness unless she stands up really fast and her heart rate goes fast.  Overall she continues to remain quite weak and tired. But has not noted any residual symptoms from her TIA.  No chest pain or shortness of breath with rest or exertion.  No PND, orthopnea or edema.  No weakness, syncope/near syncope, or TIA/amaurosis fugax symptoms. No melena, hematochezia, hematuria, or epstaxis. No claudication.  ROS: A comprehensive was  performed. Review of Systems  Constitutional: Positive for malaise/fatigue (Global weakness, thought to be potentially related to Lyme disease.).  HENT: Negative for congestion and nosebleeds.   Respiratory: Negative for cough, shortness of breath and wheezing.   Cardiovascular: Negative for claudication.  Gastrointestinal: Negative for blood in stool and melena.  Musculoskeletal: Positive for joint pain (Normal.).       Walks with either a cane or walker.  Neurological: Positive for dizziness (Sometimes with positioning). Negative for headaches.       No further TIA or amaurosis fugax symptoms.  Psychiatric/Behavioral: Positive for memory loss. The patient is not nervous/anxious and does not have insomnia.   All other systems reviewed and are negative.  Past Medical History:  Diagnosis Date  . Anemia   . Atrial fibrillation, persistent (HCC) 06/2015   Admitted with TIA. Diagnosed with A. fib. Rate control. On Eliquis.  . Hypothyroid   . Stroke [facility]) 06/2015   -TIA    Past Surgical History:  Procedure Laterality Date  . ABDOMINAL HYSTERECTOMY    . BLADDER SUSPENSION    . TRANSTHORACIC ECHOCARDIOGRAM  06/2015   In setting of TIA.  Moderate basal hypertrophy. Vigorous function with EF 65-70%. Mild aortic stenosis with mean gradient 11 mmHg. Severe MAC with mild MR. Moderate LA dilation.   Prior to Admission medications   Medication Sig Start Date End Date Taking? Authorizing Provider  apixaban (ELIQUIS) 2.5 MG TABS tablet Take 1 tablet (2.5 mg total) by mouth 2 (two) times daily. 07/29/15  Yes Abelino Derrick, PA-C  ferrous gluconate (FERGON) 240 (27 FE) MG tablet Take 240 mg by mouth daily. 04/28/15  Yes Historical Provider, MD  levothyroxine (SYNTHROID, LEVOTHROID) 50 MCG tablet Take 50 mcg by mouth daily. 06/24/15  Yes Historical Provider, MD  Vitamin D, Ergocalciferol, (DRISDOL) 50000 units CAPS capsule Take 50,000 Units by mouth once a week. 06/25/15  Yes Historical Provider, MD    metoprolol tartrate (LOPRESSOR) 25 MG tablet Take 1/2 tablet twice a day 11/19/15   Abelino Derrick, PA-C   Allergies  Allergen Reactions  . Tylenol [Acetaminophen] Other (See Comments)    Makes my body feel numb     Social History   Social History  . Marital status: Married    Spouse name: N/A  . Number of children: N/A  . Years of education: N/A   Social History Main Topics  . Smoking status: Never Smoker  . Smokeless tobacco: Never Used  . Alcohol use Yes     Comment: rarely  . Drug use: No  . Sexual activity: No   Other Topics Concern  . None   Social History Narrative  . None    Family History  Problem Relation Age of Onset  . Heart attack Cousin     Wt Readings from Last 3 Encounters:  11/18/15 109 lb 3.2 oz (49.5 kg)  07/29/15 108 lb 12.8 oz (49.4 kg)  07/15/15 111 lb (50.3 kg)    PHYSICAL EXAM BP 127/70   Pulse 83   Ht 5\' 5"  (1.651 m)   Wt 109 lb 3.2 oz (49.5 kg)   BMI 18.17 kg/m  General appearance: Relatively frail elderly woman who is alert, cooperative, appears stated age, no distress.  Neck: no adenopathy, no carotid bruit and no JVD Lungs: clear to auscultation bilaterally, normal percussion bilaterally and non-labored Heart: Irregularly irregular rhythm with normal rate, S1 & S2 normal, 2/6 SEM at RUSB; no click, rub or gallop ; nondisplaced PMI Abdomen: soft, non-tender; bowel sounds normal; no masses,  no organomegaly; no HJR Extremities: extremities normal, atraumatic, no cyanosis, and edema  Pulses: 2+ and symmetric;  Skin: thin frail skin but no rashes or lesions. Neurologic: Mental status: Alert, oriented, thought content appropriate -- somewhat slow to respond. Son answers long the questions    Adult ECG Report  Rate: 83 ;  Rhythm: indeterminate and Looks to be actually atrial fibrillation but read by the computer is atrial flutter with variable AV block. There is a barely conducted complexes versus PVCs.; septal infarct, age  undetermined and lateral infarct, age undetermined.  Narrative Interpretation: Relatively stable EKG. Rate has slowed. Looks more like atrial fibrillation and flutter on current EKG.   Other studies Reviewed: Additional studies/ records that were reviewed today include:  Recent Labs:  None available     ASSESSMENT / PLAN: Problem List Items Addressed This Visit    TIA due to embolism (HCC)    Potentially related to her new diagnosis of A. fib. Now is on [employer]. She is on the low dose based on her age and weight.      Persistent atrial fibrillation [facility]): CHA2DS2-VASc Score 6, on Low Dose Eliquis - Primary (Chronic)    Unknown duration, but initially diagnosed in April of this year. As far as I can tell she could've been it for a number of years. I suspected she really has chronic atrial fibrillation. Rate is controlled on very low-dose beta blocker. -- Okay to use additional dose of metoprolol if the heart rate  goes up. By think this is probably more related to some underlying cause than just the A. fib itself going fast. Need to stay adequately hydrated.  This patients CHA2DS2-VASc Score and unadjusted Ischemic Stroke Rate (% per year) is equal to 9.7 % stroke rate/year from a score of 6  Above score calculated as 1 point each if present [CHF, HTN, DM, Vascular=MI/PAD/Aortic Plaque, Age if 65-74, or Female] Above score calculated as 2 points each if present [Age > 75, or Stroke/TIA/TE]        Relevant Orders   EKG 12-Lead (Completed)   Hypertension    Well-controlled. She is really only on low-dose metoprolol. I don't think this is true diagnosis for her.      Relevant Orders   EKG 12-Lead (Completed)   Aortic stenosis, mild    Very mild stenosis noted on echocardiogram. She has a murmur, but given her advanced age, I don't know that we need to continue to follow for progression of disease as the second 90 years for mild stenosis. Only investigate if her  murmur worsens.       Other Visit Diagnoses   None.     Current medicines are reviewed at length with the patient today. (+/- concerns) none The following changes have been made:  CONTINUE WITH CURRENT MEDICATIONS  IF YOU HAVE IN FAST HEART RATE- MAY USE AN EXTRA METOPROLOL- BUT IT YOU PROBABLY NEED TO CONTACT PRIMARY DOCTOR   Your physician wants you to follow-up in: JAN-FEB 2018 [facility]  Studies Ordered:   Orders Placed This Encounter  Procedures  . EKG 12-Lead      Bryan Lemmaavid Angelyse Heslin, M.D., M.S. Interventional Cardiologist   Pager # [phone] Phone # [phone] [address] [place], [place] [address]

## 2015-11-18 NOTE — Patient Instructions (Addendum)
CONTINUE WITH CURRENT MEDICATIONS  IF YOU HAVE IN FAST HEART RATE- MAY USE AN EXTRA METOPROLOL- BUT IT YOU PROBABLY NEED TO CONTACT PRIMARY DOCTOR   Your physician wants you to follow-up in: JAN-FEB 2018 WITH DR HARDING. You will receive a reminder letter in the mail two months in advance. If you don't receive a letter, please call our office to schedule the follow-up appointment.  If you need a refill on your cardiac medications before your next appointment, please call your pharmacy.

## 2015-11-19 ENCOUNTER — Other Ambulatory Visit: Payer: Self-pay | Admitting: *Deleted

## 2015-11-19 DIAGNOSIS — I4891 Unspecified atrial fibrillation: Secondary | ICD-10-CM

## 2015-11-19 DIAGNOSIS — I10 Essential (primary) hypertension: Secondary | ICD-10-CM

## 2015-11-19 MED ORDER — METOPROLOL TARTRATE 25 MG PO TABS: ORAL_TABLET | ORAL | 6 refills | Status: DC

## 2015-11-20 ENCOUNTER — Encounter: Payer: Self-pay | Admitting: Cardiology

## 2015-11-20 DIAGNOSIS — I350 Nonrheumatic aortic (valve) stenosis: Secondary | ICD-10-CM | POA: Insufficient documentation

## 2015-11-20 NOTE — Assessment & Plan Note (Signed)
Well-controlled. She is really only on low-dose metoprolol. I don't think this is true diagnosis for her.

## 2015-11-20 NOTE — Assessment & Plan Note (Addendum)
Unknown duration, but initially diagnosed in April of this year. As far as I can tell she could've been it for a number of years. I suspected she really has chronic atrial fibrillation. Rate is controlled on very low-dose beta blocker. -- Okay to use additional dose of metoprolol if the heart rate goes up. By think this is probably more related to some underlying cause than just the A. fib itself going fast. Need to stay adequately hydrated.  This patients CHA2DS2-VASc Score and unadjusted Ischemic Stroke Rate (% per year) is equal to 9.7 % stroke rate/year from a score of 6  Above score calculated as 1 point each if present [CHF, HTN, DM, Vascular=MI/PAD/Aortic Plaque, Age if 65-74, or Female] Above score calculated as 2 points each if present [Age > 75, or Stroke/TIA/TE]

## 2015-11-20 NOTE — Assessment & Plan Note (Signed)
Potentially related to her new diagnosis of A. fib. Now is on [employer] [place]. She is on the low dose based on her age and weight.

## 2015-11-20 NOTE — Assessment & Plan Note (Signed)
Very mild stenosis noted on echocardiogram. She has a murmur, but given her advanced age, I don't know that we need to continue to follow for progression of disease as the second 90 years for mild stenosis. Only investigate if her murmur worsens.

## 2016-05-04 ENCOUNTER — Other Ambulatory Visit: Payer: Self-pay | Admitting: Cardiology

## 2016-05-04 DIAGNOSIS — I4891 Unspecified atrial fibrillation: Secondary | ICD-10-CM

## 2016-05-04 DIAGNOSIS — I10 Essential (primary) hypertension: Secondary | ICD-10-CM

## 2016-05-17 ENCOUNTER — Ambulatory Visit (INDEPENDENT_AMBULATORY_CARE_PROVIDER_SITE_OTHER): Payer: Medicare HMO

## 2016-05-17 ENCOUNTER — Ambulatory Visit (HOSPITAL_COMMUNITY)
Admission: EM | Admit: 2016-05-17 | Discharge: 2016-05-17 | Disposition: A | Discharge status: Home / Self Care | Payer: Medicare HMO | Attending: Family Medicine | Admitting: Family Medicine

## 2016-05-17 ENCOUNTER — Encounter (HOSPITAL_COMMUNITY): Payer: Self-pay | Admitting: Emergency Medicine

## 2016-05-17 DIAGNOSIS — S20211A Contusion of right front wall of thorax, initial encounter: Secondary | ICD-10-CM

## 2016-05-17 NOTE — Discharge Instructions (Signed)
Heat and aleve as needed, activity as tolerated, return as needed.

## 2016-05-17 NOTE — ED Triage Notes (Signed)
Pt fell backwards trying to open a door last Monday.  Pt states she hit the back of her head on the wall.  She noticed pain in her right shoulder, chest and back since the fall.  She denies LOC.

## 2016-05-17 NOTE — ED Provider Notes (Signed)
[facility]    CSN: [id number] Arrival date & time: 05/17/16  1506     History   Chief Complaint Chief Complaint  Patient presents with  . Fall    HPI Caitlyn Henry is a 83 y.o. female.   The history is provided by the patient and a relative.  Fall  This is a new problem. The current episode started more than 1 week ago (fell backward at home carport onto right side, now with continued right subscapular soreness, no sob,). The problem has not changed since onset.Associated symptoms include chest pain. Pertinent negatives include no headaches and no shortness of breath.    Past Medical History:  Diagnosis Date  . Anemia   . Atrial fibrillation, persistent ([place]) 06/2015   Admitted with TIA. Diagnosed with A. fib. Rate control. On Eliquis.  . Hypothyroid   . Stroke [facility]) 06/2015   -TIA    Patient Active Problem List   Diagnosis Date Noted  . Aortic stenosis, mild 11/20/2015  . Chronic anticoagulation 2020-03-2515  . Dizziness, nonspecific 07/16/2015  . Cephalalgia   . TIA due to embolism ([place])   . Hypothyroidism 07/15/2015  . Acute left-sided weakness   . Encounter for anticoagulation discussion and counseling   . Stroke ([facility]) 07/14/2015  . Left-sided weakness 07/14/2015  . Persistent atrial fibrillation ([place]): CHA2DS2-VASc Score 6, on Low Dose Eliquis 07/14/2015  . Headache 07/14/2015  . Hypertension 07/14/2015  . Positive Lyme disease serology 07/14/2015  . Anemia     Past Surgical History:  Procedure Laterality Date  . ABDOMINAL HYSTERECTOMY    . BLADDER SUSPENSION    . TRANSTHORACIC ECHOCARDIOGRAM  06/2015   In setting of TIA.  Moderate basal hypertrophy. Vigorous function with EF 65-70%. Mild aortic stenosis with mean gradient 11 mmHg. Severe MAC with mild MR. Moderate LA dilation.    OB History    No data available       Home Medications    Prior to Admission medications   Medication Sig Start Date End Date Taking? Authorizing  Provider  ELIQUIS 2.5 MG TABS tablet TAKE 1 TABLET BY MOUTH TWICE A DAY 05/05/16  Yes Leonie Man, MD  ferrous gluconate (FERGON) 240 (27 FE) MG tablet Take 240 mg by mouth daily. 04/28/15  Yes Historical Provider, MD  levothyroxine (SYNTHROID, LEVOTHROID) 50 MCG tablet Take 50 mcg by mouth daily. 06/24/15  Yes Historical Provider, MD  metoprolol tartrate (LOPRESSOR) 25 MG tablet Take 1/2 tablet twice a day 11/19/15  Yes Luke K Kilroy, PA-C  Vitamin D, Ergocalciferol, (DRISDOL) 50000 units CAPS capsule Take 50,000 Units by mouth once a week. 06/25/15  Yes Historical Provider, MD    Family History Family History  Problem Relation Age of Onset  . Heart attack Cousin     Social History Social History  Substance Use Topics  . Smoking status: Never Smoker  . Smokeless tobacco: Never Used  . Alcohol use Yes     Comment: rarely     Allergies   Tylenol [acetaminophen]   Review of Systems Review of Systems  Constitutional: Negative.   HENT: Negative.   Respiratory: Negative for choking, shortness of breath and wheezing.   Cardiovascular: Positive for chest pain. Negative for palpitations.  Gastrointestinal: Negative.   Genitourinary: Negative.   Neurological: Negative for headaches.  All other systems reviewed and are negative.    Physical Exam Triage Vital Signs ED Triage Vitals [05/17/16 1532]  Enc Vitals Group     BP 143/81  Pulse Rate 68     Resp      Temp 97.6 F (36.4 C)     Temp src      SpO2 100 %     Weight      Height      Head Circumference      Peak Flow      Pain Score 8     Pain Loc      Pain Edu?      Excl. in [place]?    No data found.   Updated Vital Signs BP 143/81 (BP Location: Left Arm)   Pulse 68 Comment: Pt has A-fib  Temp 97.6 F (36.4 C)   SpO2 100%   Visual Acuity Right Eye Distance:   Left Eye Distance:   Bilateral Distance:    Right Eye Near:   Left Eye Near:    Bilateral Near:     Physical Exam  Constitutional: She is  oriented to person, place, and time. She appears well-developed and well-nourished. No distress.  HENT:  Head: Normocephalic.  Right Ear: External ear normal.  Left Ear: External ear normal.  Eyes: Pupils are equal, round, and reactive to light.  Neck: Normal range of motion. Neck supple.  Cardiovascular: Normal rate and regular rhythm.   Pulmonary/Chest: Effort normal and breath sounds normal. She exhibits tenderness.  Abdominal: Soft. Bowel sounds are normal.  Neurological: She is alert and oriented to person, place, and time.  Skin: Skin is warm and dry.  Nursing note and vitals reviewed.    UC Treatments / Results  Labs (all labs ordered are listed, but only abnormal results are displayed) Labs Reviewed - No data to display  EKG  EKG Interpretation None       Radiology Dg Ribs Unilateral W/chest Right  Result Date: 05/17/2016 CLINICAL DATA:  [place] 1 week ago.  Persistent right-sided chest pain. EXAM: RIGHT RIBS AND CHEST - 3+ VIEW COMPARISON:  Chest x-ray 07/14/2015 FINDINGS: The cardiac silhouette, mediastinal and hilar contours are within normal limits and stable. Severe chronic lung disease with emphysema and pulmonary scarring. Stable irregular right upper lobe density. No pleural effusion. Dedicated right rib films do not demonstrate any definite acute rib fractures. IMPRESSION: Chronic lung changes but no definite acute overlying pulmonary process. No definite acute right-sided rib fractures. Electronically Signed   By: Marijo Sanes M.D.   On: 05/17/2016 16:25   X-rays reviewed and report per radiologist.    Procedures Procedures (including critical care time)  Medications Ordered in UC Medications - No data to display   Initial Impression / Assessment and Plan / UC Course  I have reviewed the triage vital signs and the nursing notes.  Pertinent labs & imaging results that were available during my care of the patient were reviewed by me and considered in my  medical decision making (see chart for details).       Final Clinical Impressions(s) / UC Diagnoses   Final diagnoses:  Contusion of right chest wall, initial encounter    New Prescriptions New Prescriptions   No medications on file     Billy Fischer, MD 05/17/16 [phone]

## 2016-05-20 ENCOUNTER — Encounter (HOSPITAL_COMMUNITY): Payer: Self-pay | Admitting: *Deleted

## 2016-05-20 ENCOUNTER — Observation Stay (HOSPITAL_COMMUNITY)
Admission: EM | Admit: 2016-05-20 | Discharge: 2016-05-23 | Disposition: A | Discharge status: Home Health | Payer: Medicare HMO | Attending: Internal Medicine | Admitting: Internal Medicine

## 2016-05-20 ENCOUNTER — Emergency Department (HOSPITAL_COMMUNITY): Payer: Medicare HMO

## 2016-05-20 DIAGNOSIS — N390 Urinary tract infection, site not specified: Secondary | ICD-10-CM | POA: Diagnosis not present

## 2016-05-20 DIAGNOSIS — Y92008 Other place in unspecified non-institutional (private) residence as the place of occurrence of the external cause: Secondary | ICD-10-CM | POA: Insufficient documentation

## 2016-05-20 DIAGNOSIS — Z7901 Long term (current) use of anticoagulants: Secondary | ICD-10-CM | POA: Diagnosis not present

## 2016-05-20 DIAGNOSIS — E860 Dehydration: Secondary | ICD-10-CM | POA: Diagnosis not present

## 2016-05-20 DIAGNOSIS — E039 Hypothyroidism, unspecified: Secondary | ICD-10-CM | POA: Diagnosis not present

## 2016-05-20 DIAGNOSIS — I4819 Other persistent atrial fibrillation: Secondary | ICD-10-CM | POA: Diagnosis present

## 2016-05-20 DIAGNOSIS — Z79899 Other long term (current) drug therapy: Secondary | ICD-10-CM | POA: Insufficient documentation

## 2016-05-20 DIAGNOSIS — D649 Anemia, unspecified: Secondary | ICD-10-CM | POA: Insufficient documentation

## 2016-05-20 DIAGNOSIS — G9340 Encephalopathy, unspecified: Secondary | ICD-10-CM | POA: Diagnosis not present

## 2016-05-20 DIAGNOSIS — N3001 Acute cystitis with hematuria: Secondary | ICD-10-CM

## 2016-05-20 DIAGNOSIS — W19XXXA Unspecified fall, initial encounter: Secondary | ICD-10-CM | POA: Diagnosis not present

## 2016-05-20 DIAGNOSIS — S22089A Unspecified fracture of T11-T12 vertebra, initial encounter for closed fracture: Secondary | ICD-10-CM | POA: Insufficient documentation

## 2016-05-20 DIAGNOSIS — I10 Essential (primary) hypertension: Secondary | ICD-10-CM | POA: Diagnosis not present

## 2016-05-20 DIAGNOSIS — R4182 Altered mental status, unspecified: Secondary | ICD-10-CM

## 2016-05-20 DIAGNOSIS — I481 Persistent atrial fibrillation: Secondary | ICD-10-CM | POA: Insufficient documentation

## 2016-05-20 DIAGNOSIS — I350 Nonrheumatic aortic (valve) stenosis: Secondary | ICD-10-CM

## 2016-05-20 DIAGNOSIS — S22000A Wedge compression fracture of unspecified thoracic vertebra, initial encounter for closed fracture: Secondary | ICD-10-CM

## 2016-05-20 DIAGNOSIS — S22080A Wedge compression fracture of T11-T12 vertebra, initial encounter for closed fracture: Secondary | ICD-10-CM | POA: Diagnosis present

## 2016-05-20 LAB — CBC WITH DIFFERENTIAL/PLATELET
Basophils Absolute: 0 10*3/uL (ref 0.0–0.1)
Basophils Relative: 0 %
EOS PCT: 0 %
Eosinophils Absolute: 0 10*3/uL (ref 0.0–0.7)
HCT: 40.5 % (ref 36.0–46.0)
HEMOGLOBIN: 13.4 g/dL (ref 12.0–15.0)
LYMPHS ABS: 2.1 10*3/uL (ref 0.7–4.0)
LYMPHS PCT: 18 %
MCH: 29.8 pg (ref 26.0–34.0)
MCHC: 33.1 g/dL (ref 30.0–36.0)
MCV: 90.2 fL (ref 78.0–100.0)
MONOS PCT: 4 %
Monocytes Absolute: 0.5 10*3/uL (ref 0.1–1.0)
Neutro Abs: 8.9 10*3/uL — ABNORMAL HIGH (ref 1.7–7.7)
Neutrophils Relative %: 78 %
Platelets: 233 10*3/uL (ref 150–400)
RBC: 4.49 MIL/uL (ref 3.87–5.11)
RDW: 13.3 % (ref 11.5–15.5)
WBC: 11.5 10*3/uL — AB (ref 4.0–10.5)

## 2016-05-20 LAB — URINALYSIS, ROUTINE W REFLEX MICROSCOPIC
BACTERIA UA: NONE SEEN
Bilirubin Urine: NEGATIVE
Glucose, UA: NEGATIVE mg/dL
KETONES UR: NEGATIVE mg/dL
LEUKOCYTES UA: NEGATIVE
NITRITE: POSITIVE — AB
PROTEIN: 30 mg/dL — AB
Specific Gravity, Urine: 1.013 (ref 1.005–1.030)
Squamous Epithelial / LPF: NONE SEEN
pH: 7 (ref 5.0–8.0)

## 2016-05-20 LAB — BASIC METABOLIC PANEL
Anion gap: 11 (ref 5–15)
BUN: 21 mg/dL — AB (ref 6–20)
CHLORIDE: 100 mmol/L — AB (ref 101–111)
CO2: 25 mmol/L (ref 22–32)
CREATININE: 0.99 mg/dL (ref 0.44–1.00)
Calcium: 9.6 mg/dL (ref 8.9–10.3)
GFR calc Af Amer: 56 mL/min — ABNORMAL LOW (ref >60)
GFR calc non Af Amer: 49 mL/min — ABNORMAL LOW (ref >60)
GLUCOSE: 131 mg/dL — AB (ref 65–99)
POTASSIUM: 4.2 mmol/L (ref 3.5–5.1)
Sodium: 136 mmol/L (ref 135–145)

## 2016-05-20 LAB — TSH: TSH: 5.549 u[IU]/mL — ABNORMAL HIGH (ref 0.350–4.500)

## 2016-05-20 LAB — I-STAT TROPONIN, ED: Troponin i, poc: 0 ng/mL (ref 0.00–0.08)

## 2016-05-20 LAB — VITAMIN B12: Vitamin B-12: 1198 pg/mL — ABNORMAL HIGH (ref 180–914)

## 2016-05-20 MED ORDER — APIXABAN 2.5 MG PO TABS
2.5000 mg | ORAL_TABLET | Freq: Two times a day (BID) | ORAL | Status: DC
  Administered 2016-05-20 – 2016-05-23 (×7): 2.5 mg via ORAL
  Filled 2016-05-20 (×8): qty 1

## 2016-05-20 MED ORDER — ONDANSETRON HCL 4 MG/2ML IJ SOLN
4.0000 mg | Freq: Four times a day (QID) | INTRAMUSCULAR | Status: DC | PRN
  Administered 2016-05-23: 4 mg via INTRAVENOUS
  Filled 2016-05-20: qty 2

## 2016-05-20 MED ORDER — METOPROLOL TARTRATE 12.5 MG HALF TABLET
12.5000 mg | ORAL_TABLET | Freq: Two times a day (BID) | ORAL | Status: DC
  Administered 2016-05-20 – 2016-05-23 (×7): 12.5 mg via ORAL
  Filled 2016-05-20 (×7): qty 1

## 2016-05-20 MED ORDER — FERROUS GLUCONATE 324 (38 FE) MG PO TABS
324.0000 mg | ORAL_TABLET | Freq: Every day | ORAL | Status: DC
  Administered 2016-05-20 – 2016-05-23 (×4): 324 mg via ORAL
  Filled 2016-05-20 (×4): qty 1

## 2016-05-20 MED ORDER — LORAZEPAM 2 MG/ML IJ SOLN
0.5000 mg | INTRAMUSCULAR | Status: DC | PRN
  Administered 2016-05-20: 0.5 mg via INTRAVENOUS
  Filled 2016-05-20: qty 1

## 2016-05-20 MED ORDER — DEXTROSE 5 % IV SOLN
1.0000 g | INTRAVENOUS | Status: DC
  Administered 2016-05-21 – 2016-05-22 (×2): 1 g via INTRAVENOUS
  Filled 2016-05-20 (×3): qty 10

## 2016-05-20 MED ORDER — VITAMIN D (ERGOCALCIFEROL) 1.25 MG (50000 UNIT) PO CAPS: 50000.0000 [IU] | ORAL_CAPSULE | ORAL | Status: DC

## 2016-05-20 MED ORDER — SODIUM CHLORIDE 0.9 % IV SOLN
INTRAVENOUS | Status: AC
  Administered 2016-05-20: 14:00:00 via INTRAVENOUS

## 2016-05-20 MED ORDER — MAGNESIUM HYDROXIDE 400 MG/5ML PO SUSP
30.0000 mL | Freq: Every day | ORAL | Status: AC
  Administered 2016-05-20 – 2016-05-21 (×2): 30 mL via ORAL
  Filled 2016-05-20 (×2): qty 30

## 2016-05-20 MED ORDER — LEVOTHYROXINE SODIUM 50 MCG PO TABS
50.0000 ug | ORAL_TABLET | Freq: Every day | ORAL | Status: DC
  Administered 2016-05-20 – 2016-05-22 (×3): 50 ug via ORAL
  Filled 2016-05-20 (×3): qty 1

## 2016-05-20 MED ORDER — DEXTROSE 5 % IV SOLN
1.0000 g | Freq: Once | INTRAVENOUS | Status: AC
  Administered 2016-05-20: 1 g via INTRAVENOUS
  Filled 2016-05-20: qty 10

## 2016-05-20 MED ORDER — ONDANSETRON HCL 4 MG PO TABS: 4.0000 mg | ORAL_TABLET | Freq: Four times a day (QID) | ORAL | Status: DC | PRN

## 2016-05-20 NOTE — ED Notes (Signed)
Started second line d/t initial line not running with pump. Both IVs patent.

## 2016-05-20 NOTE — ED Notes (Signed)
Admitting team at bedside.

## 2016-05-20 NOTE — ED Notes (Addendum)
Patient is stable and ready to be transport to the floor at this time.  Report was called to Joice LoftsGretta 5W RN.  Belongings taken with the patient to the floor.

## 2016-05-20 NOTE — ED Notes (Signed)
Patient transported to X-ray 

## 2016-05-20 NOTE — Progress Notes (Signed)
Orthopedic Tech Progress Note Patient Details:  Caitlyn Henry [birth year] [id number]  Patient ID: Caitlyn AlaminVirginia Gartman, female   DOB: [birth year], 83 y.o.   MRN: [id number]   Saul FordyceJennifer C Tyara Dassow 05/20/2016, 2:52 PMCalled [employer] for TLSO brace.

## 2016-05-20 NOTE — Progress Notes (Signed)
Patient trasfered from ED to 5W26 via stretcher; alert and oriented x 2; feeling uncomfortable wearing the TSLO brace; IV in LFA running fluids @ 50cc/hr; Orient patient to room and unit;  gave patient care guide; instructed how to use the call bell and  fall risk precautions. Will continue to monitor the patient.
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
## 2016-05-20 NOTE — Progress Notes (Signed)
MD notified about patient's family request for pain medicine and anxiety medicine. Will continue to monitor.

## 2016-05-20 NOTE — ED Notes (Signed)
Tech at bedside applying TLSO.
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
## 2016-05-21 DIAGNOSIS — R4182 Altered mental status, unspecified: Secondary | ICD-10-CM | POA: Diagnosis not present

## 2016-05-21 DIAGNOSIS — I481 Persistent atrial fibrillation: Secondary | ICD-10-CM

## 2016-05-21 DIAGNOSIS — I10 Essential (primary) hypertension: Secondary | ICD-10-CM | POA: Diagnosis not present

## 2016-05-21 DIAGNOSIS — M4854XA Collapsed vertebra, not elsewhere classified, thoracic region, initial encounter for fracture: Secondary | ICD-10-CM | POA: Diagnosis not present

## 2016-05-21 DIAGNOSIS — G9340 Encephalopathy, unspecified: Secondary | ICD-10-CM

## 2016-05-21 DIAGNOSIS — I350 Nonrheumatic aortic (valve) stenosis: Secondary | ICD-10-CM

## 2016-05-21 LAB — BASIC METABOLIC PANEL
ANION GAP: 10 (ref 5–15)
BUN: 26 mg/dL — ABNORMAL HIGH (ref 6–20)
CALCIUM: 9.2 mg/dL (ref 8.9–10.3)
CO2: 28 mmol/L (ref 22–32)
CREATININE: 0.97 mg/dL (ref 0.44–1.00)
Chloride: 96 mmol/L — ABNORMAL LOW (ref 101–111)
GFR, EST AFRICAN AMERICAN: 58 mL/min — AB (ref >60)
GFR, EST NON AFRICAN AMERICAN: 50 mL/min — AB (ref >60)
Glucose, Bld: 107 mg/dL — ABNORMAL HIGH (ref 65–99)
Potassium: 4 mmol/L (ref 3.5–5.1)
SODIUM: 134 mmol/L — AB (ref 135–145)

## 2016-05-21 LAB — URINE CULTURE

## 2016-05-21 LAB — CBC
HCT: 38 % (ref 36.0–46.0)
HEMOGLOBIN: 12.3 g/dL (ref 12.0–15.0)
MCH: 29.7 pg (ref 26.0–34.0)
MCHC: 32.4 g/dL (ref 30.0–36.0)
MCV: 91.8 fL (ref 78.0–100.0)
PLATELETS: 234 10*3/uL (ref 150–400)
RBC: 4.14 MIL/uL (ref 3.87–5.11)
RDW: 13.5 % (ref 11.5–15.5)
WBC: 7.9 10*3/uL (ref 4.0–10.5)

## 2016-05-21 LAB — T4, FREE: FREE T4: 1.18 ng/dL — AB (ref 0.61–1.12)

## 2016-05-21 LAB — RPR: RPR Ser Ql: NONREACTIVE

## 2016-05-21 NOTE — Progress Notes (Signed)
PROGRESS NOTE    [place]  QMV:[id number] DOB: September 08, 1923 DOA: 05/20/2016 PCP: Aida Puffer, MD    Brief Narrative:  83 y.o. female with medical history significant for anemia, A. fib on our quests, history TIA, hypertension, hyperthyroidism presents to the emergency department with the chief complaint is weakness and altered mental status and fall. Initial evaluation reveals urinary tract infection dehydration  Information is obtained from the patient and the chart and the daughter who is at the bedside noting that information from patient may be unreliable do to acute encephalopathy. Daughter reports patient has experienced what sounds like 2 mechanical falls over the last week. One episode she was going up the stairs grabbing the side relative admit. Second episode she was reaching for doorknob to open a door missed the doorknob fell backwards. There was no loss of consciousness on either event. She did her head on the second episode. She was able to get up under her own 16 but complained of soreness afterwards. He was evaluated at that time with a chest x-ray that was unremarkable. Daughter reports this morning she fell again at this time she was unable to get up off the floor. Associated symptoms include decreased oral intake and episodes of "confusion" no reports of any complaints of headache dizziness chest pain palpitations shortness of breath. Patient denies abdominal pain nausea vomiting diarrhea. Daughter reports it's "several days" since her last bowel movement. Patient denies any dysuria hematuria frequency or urgency.  Assessment & Plan:   Principal Problem:   Acute encephalopathy Active Problems:   Hypertension   Hypothyroidism   Aortic stenosis, mild   UTI (urinary tract infection)   Compression fracture of T12 vertebra (HCC)   Atrial fibrillation, persistent (HCC)  #1. Acute encephalopathy possibly secondary to dehydration vs possible underlying dementia -Urinalysis  neg for WBC's, neg for bacteria, but is pos only for nitrites. Urine culture unremarkable - CT of the head negative for any acute abnormality. Neuro exam benign.  No indication of metabolic derangement.  - TSH unremarkable - Mental status change noted to be intermittent and worse at night - question sundowning in setting of previously unknown baseline dementia? -No sedating meds noted -Will consult Psychiatry for input regarding mental status change  #2. Leukocytosis - unclear etiology, possible UTI  -IV fluids as noted above -UA with no wbc, no leukocytes, no bacteria -Afebrile -WBC normalized overnight with abx. For now, will continue empirically -Urine cx unremarkable - Possible leukocytosis related to compression fx  #3. Compression fracture of T12. Related to mechanical fall. Xray thoracic spine reveals fx. she complains of pain with movement. -Continue with pain control -Physical therapy -Chart reviewed. Neurosurgery recommends brace with outpatient f/u  4. Atrial fibrillation.[place] score 5.  - Home medications include eliquis and metoprolol -continue home meds for now -stable at present  DVT prophylaxis: Eliquis Code Status: No intubation, No CPR // OK for BiPAP, ACLS meds, Cardioversion Family Communication: Pt in room, family at bedside Disposition Plan: Uncertain at this time  Consultants:   Psychiatry  Procedures:     Antimicrobials: Anti-infectives    Start     Dose/Rate Route Frequency Ordered Stop   05/21/16 1000  cefTRIAXone (ROCEPHIN) 1 g in dextrose 5 % 50 mL IVPB     1 g 100 mL/hr over 30 Minutes Intravenous Every 24 hours 05/20/16 1400     05/20/16 1300  cefTRIAXone (ROCEPHIN) 1 g in dextrose 5 % 50 mL IVPB     1 g 100 mL/hr over  30 Minutes Intravenous  Once 05/20/16 1253 05/20/16 1531       Subjective: Pleasantly confused this AM  Objective: Vitals:   05/20/16 2101 05/21/16 0052 05/21/16 0519 05/21/16 1415  BP: (!) 193/98  123/61 112/61    Pulse: 82  71 73  Resp: 19  20 18   Temp: 98 F (36.7 C)  98.2 F (36.8 C) 97.5 F (36.4 C)  TempSrc: Oral     SpO2: 98%  97% 98%  Weight:  48.1 kg (106 lb)    Height:        Intake/Output Summary (Last 24 hours) at 05/21/16 1422 Last data filed at 05/21/16 0500  Gross per 24 hour  Intake              960 ml  Output                0 ml  Net              960 ml   Filed Weights   05/20/16 0921 05/21/16 0052  Weight: 49 kg (108 lb) 48.1 kg (106 lb)    Examination:  General exam: Appears calm and comfortable  Respiratory system: Clear to auscultation. Respiratory effort normal. Cardiovascular system: S1 & S2 heard, RRR. Gastrointestinal system: Abdomen is nondistended, soft and nontender. No organomegaly or masses felt. Normal bowel sounds heard. Central nervous system: Alert and oriented. No focal neurological deficits. Extremities: Symmetric 5 x 5 power. Skin: No rashes, lesions or ulcers Psychiatry: Initially pleasant and appropriately conversant, becomes confused and tangential in thought, mood appears normal.   Data Reviewed: I have personally reviewed following labs and imaging studies  CBC:  Recent Labs Lab 05/20/16 1044 05/21/16 0430  WBC 11.5* 7.9  NEUTROABS 8.9*  --   HGB 13.4 12.3  HCT 40.5 38.0  MCV 90.2 91.8  PLT 233 234   Basic Metabolic Panel:  Recent Labs Lab 05/20/16 1044 05/21/16 0430  NA 136 134*  K 4.2 4.0  CL 100* 96*  CO2 25 28  GLUCOSE 131* 107*  BUN 21* 26*  CREATININE 0.99 0.97  CALCIUM 9.6 9.2   GFR: Estimated Creatinine Clearance: 29.3 mL/min (by C-G formula based on SCr of 0.97 mg/dL). Liver Function Tests: No results for input(s): AST, ALT, ALKPHOS, BILITOT, PROT, ALBUMIN in the last 168 hours. No results for input(s): LIPASE, AMYLASE in the last 168 hours. No results for input(s): AMMONIA in the last 168 hours. Coagulation Profile: No results for input(s): INR, PROTIME in the last 168 hours. Cardiac Enzymes: No  results for input(s): CKTOTAL, CKMB, CKMBINDEX, TROPONINI in the last 168 hours. BNP (last 3 results) No results for input(s): PROBNP in the last 8760 hours. HbA1C: No results for input(s): HGBA1C in the last 72 hours. CBG: No results for input(s): GLUCAP in the last 168 hours. Lipid Profile: No results for input(s): CHOL, HDL, LDLCALC, TRIG, CHOLHDL, LDLDIRECT in the last 72 hours. Thyroid Function Tests:  Recent Labs  05/20/16 1343 05/21/16 0856  TSH 5.549*  --   FREET4  --  1.18*   Anemia Panel:  Recent Labs  05/20/16 1343  VITAMINB12 1,198*   Sepsis Labs: No results for input(s): PROCALCITON, LATICACIDVEN in the last 168 hours.  Recent Results (from the past 240 hour(s))  Urine culture     Status: Abnormal   Collection Time: 05/20/16 11:40 AM  Result Value Ref Range Status   Specimen Description URINE, RANDOM  Final   Special Requests NONE  Final  Culture MULTIPLE SPECIES PRESENT, SUGGEST RECOLLECTION (A)  Final   Report Status 05/21/2016 FINAL  Final     Radiology Studies: Dg Chest 2 View  Result Date: 05/20/2016 CLINICAL DATA:  Fall twice within the past 2 weeks. EXAM: CHEST  2 VIEW COMPARISON:  05/17/2016 FINDINGS: Chronic changes within the upper lobes with scarring. Underline COPD. Mild cardiomegaly. No effusions or acute bony abnormality. IMPRESSION: COPD, advance chronic lung disease.  No active disease. Electronically Signed   By: Charlett Nose M.D.   On: 05/20/2016 12:24   Dg Thoracic Spine 2 View  Result Date: 05/20/2016 CLINICAL DATA:  Has fallen twice in the past week, neck, back and RIGHT shoulder pain, on Eliquis for atrial fibrillation EXAM: THORACIC SPINE 2 VIEWS COMPARISON:  Chest radiographs 07/14/2015, chest CT 07/15/2015 FINDINGS: Diffuse osseous demineralization. Minimal chronic anterior height loss of a mid thoracic vertebra unchanged since prior CT. New superior endplate compression deformity of a lower thoracic vertebra, approximately T12, with  approximately 25% anterior height loss. No additional fracture, subluxation or bone destruction. Mitral annular calcification noted. IMPRESSION: Osseous demineralization with new superior endplate compression fracture of T12 vertebral body with approximately 25% anterior height loss. Electronically Signed   By: Ulyses Southward M.D.   On: 05/20/2016 12:24   Dg Lumbar Spine Complete  Result Date: 05/20/2016 CLINICAL DATA:  Fall. EXAM: LUMBAR SPINE - COMPLETE 4+ VIEW COMPARISON:  05/17/2016 .  CT 07/15/2015. FINDINGS: Lumbar vertebra numbered with the lowest segmented appearing lumbar shaped vertebra as L5 . Diffuse osteopenia and degenerative change. Prominent scoliosis noted. Mild L1 and L2 compression fractures cannot be excluded. Lower thoracic vertebral body compression fractures cannot be excluded. Aortoiliac atherosclerotic vascular disease. IMPRESSION: 1. Cannot exclude mild L1 and L2 compression fractures. Lower thoracic vertebral body compression fractures cannot be excluded. If further evaluation of the thoracic and lumbar spine needed MRI can be obtained. 2. Diffuse severe osteopenia.  Scoliosis. 3. Aortoiliac atherosclerotic vascular disease. Electronically Signed   By: Maisie Fus  Register   On: 05/20/2016 12:28   Dg Pelvis 1-2 Views  Result Date: 05/20/2016 CLINICAL DATA:  Fall. EXAM: PELVIS - 1-2 VIEW COMPARISON:  No recent . FINDINGS: Diffuse osteopenia and degenerative change. No acute bony or joint abnormality identified. Pelvic calcifications consistent phleboliths. Aortoiliac atherosclerotic vascular calcification. IMPRESSION: 1. Diffuse osteopenia degenerative change. No acute abnormality identified. 2. Aortoiliac atherosclerotic vascular disease. Electronically Signed   By: Maisie Fus  Register   On: 05/20/2016 12:22   Dg Shoulder Right  Result Date: 05/20/2016 CLINICAL DATA:  Larey Seat twice in last week, on Eliquis, right shoulder pain EXAM: RIGHT SHOULDER - 2+ VIEW COMPARISON:  None. FINDINGS: Two  views of the right shoulder submitted. No acute fracture or subluxation. There is diffuse osteopenia. Mild to moderate degenerative changes AC joint. Minimal spurring of humeral head. IMPRESSION: No acute fracture or subluxation. Degenerative changes as described above. Diffuse osteopenia. Electronically Signed   By: Natasha Mead M.D.   On: 05/20/2016 12:22   Ct Head Wo Contrast  Result Date: 05/20/2016 CLINICAL DATA:  Larey Seat last week, hit back of the head on the carport EXAM: CT HEAD WITHOUT CONTRAST CT CERVICAL SPINE WITHOUT CONTRAST TECHNIQUE: Multidetector CT imaging of the head and cervical spine was performed following the standard protocol without intravenous contrast. Multiplanar CT image reconstructions of the cervical spine were also generated. COMPARISON:  07/14/2015 FINDINGS: CT HEAD FINDINGS Brain: No intracranial hemorrhage, mass effect or midline shift. Stable cerebral atrophy. Stable periventricular and patchy subcortical chronic white matter  disease. No acute cortical infarction. No mass lesion is noted on this unenhanced scan. Vascular: Atherosclerotic calcifications of carotid siphon and vertebral arteries again noted. Skull: No skull fracture.  No bony lesions are noted. Sinuses/Orbits: No acute findings. No paranasal sinuses air-fluid levels. Other: None CT CERVICAL SPINE FINDINGS Alignment: Normal alignment.  There is diffuse osteopenia. Skull base and vertebrae: No acute fracture or subluxation. Degenerative changes are noted C1-C2 articulation. Mild posterior spurring at C4-C5 and C6-C7 level. Mild anterior spurring lower endplate of C4 and C6 vertebral body. Soft tissues and spinal canal: No prevertebral soft tissue swelling. Spinal canal is patent. Disc levels: Mild disc space flattening at C5-C6 and C6-C7 level. Right side facet degenerative changes noted at C4-C5 level. Bilateral facet degenerative changes noted at C5-C6 level. Upper chest: There is no pneumothorax in lung apices.  Bilateral apical pleuroparenchymal scarring. Probable scarring and chronic bronchiectasis in right upper lobe posteriorly. Other: None IMPRESSION: 1. No acute intracranial abnormality. Stable atrophy and chronic white matter disease. No definite acute cortical infarction. No skull fracture is noted. 2. No cervical spine acute fracture or subluxation. Diffuse osteopenia. Mild degenerative changes as described above. 3. Bilateral apical pleuroparenchymal scarring. Probable chronic bronchiectasis and fibrotic changes in right upper lobe posteriorly. Electronically Signed   By: Natasha MeadLiviu  Pop M.D.   On: 05/20/2016 11:31   Ct Cervical Spine Wo Contrast  Result Date: 05/20/2016 CLINICAL DATA:  Larey SeatFell last week, hit back of the head on the carport EXAM: CT HEAD WITHOUT CONTRAST CT CERVICAL SPINE WITHOUT CONTRAST TECHNIQUE: Multidetector CT imaging of the head and cervical spine was performed following the standard protocol without intravenous contrast. Multiplanar CT image reconstructions of the cervical spine were also generated. COMPARISON:  07/14/2015 FINDINGS: CT HEAD FINDINGS Brain: No intracranial hemorrhage, mass effect or midline shift. Stable cerebral atrophy. Stable periventricular and patchy subcortical chronic white matter disease. No acute cortical infarction. No mass lesion is noted on this unenhanced scan. Vascular: Atherosclerotic calcifications of carotid siphon and vertebral arteries again noted. Skull: No skull fracture.  No bony lesions are noted. Sinuses/Orbits: No acute findings. No paranasal sinuses air-fluid levels. Other: None CT CERVICAL SPINE FINDINGS Alignment: Normal alignment.  There is diffuse osteopenia. Skull base and vertebrae: No acute fracture or subluxation. Degenerative changes are noted C1-C2 articulation. Mild posterior spurring at C4-C5 and C6-C7 level. Mild anterior spurring lower endplate of C4 and C6 vertebral body. Soft tissues and spinal canal: No prevertebral soft tissue  swelling. Spinal canal is patent. Disc levels: Mild disc space flattening at C5-C6 and C6-C7 level. Right side facet degenerative changes noted at C4-C5 level. Bilateral facet degenerative changes noted at C5-C6 level. Upper chest: There is no pneumothorax in lung apices. Bilateral apical pleuroparenchymal scarring. Probable scarring and chronic bronchiectasis in right upper lobe posteriorly. Other: None IMPRESSION: 1. No acute intracranial abnormality. Stable atrophy and chronic white matter disease. No definite acute cortical infarction. No skull fracture is noted. 2. No cervical spine acute fracture or subluxation. Diffuse osteopenia. Mild degenerative changes as described above. 3. Bilateral apical pleuroparenchymal scarring. Probable chronic bronchiectasis and fibrotic changes in right upper lobe posteriorly. Electronically Signed   By: Natasha MeadLiviu  Pop M.D.   On: 05/20/2016 11:31    Scheduled Meds: . apixaban  2.5 mg Oral BID  . cefTRIAXone (ROCEPHIN)  IV  1 g Intravenous Q24H  . ferrous gluconate  324 mg Oral Daily  . levothyroxine  50 mcg Oral QAC breakfast  . metoprolol tartrate  12.5 mg Oral BID  . [  START ON 05/24/2016] Vitamin D (Ergocalciferol)  50,000 Units Oral Weekly   Continuous Infusions:   LOS: 0 days   CHIU, Scheryl Marten, MD [facility] Pager [phone]  If 7PM-7AM, please contact night-coverage [email or web address] Password [email or web address] 05/21/2016, 2:22 PM

## 2016-05-21 NOTE — Evaluation (Signed)
Physical Therapy Evaluation Patient Details Name: Caitlyn Henry MRN: [id number] DOB: 24-Mar-1923 Today's Date: 05/21/2016   History of Present Illness  83 y.o. female with medical history significant for anemia, A. fib, TIA, hypertension, and hyperthyroidism. She presented to the emergency department with weakness, altered mental status and fall.  Pt admitted for UTI, acute encephalopathy, and thoracic comp fx.   Clinical Impression  Pt admitted with above diagnosis. Pt currently with functional limitations due to the deficits listed below (see PT Problem List). On eval, pt required min assist for all functional mobility. She ambulated 15 feet with RW. Pt will benefit from skilled PT to increase their independence and safety with mobility to allow discharge to the venue listed below.  Daughter present in room and reports family will be able to provide needed level of assist at home.     Follow Up Recommendations Home health PT;Supervision/Assistance - 24 hour    Equipment Recommendations  None recommended by PT    Recommendations for Other Services       Precautions / Restrictions Precautions Precautions: Back Precaution Comments: educated on back precautions for comfort Required Braces or Orthoses: Spinal Brace Spinal Brace: Thoracolumbosacral orthotic;Applied in sitting position Restrictions Weight Bearing Restrictions: No      Mobility  Bed Mobility Overal bed mobility: Needs Assistance Bed Mobility: Rolling;Sidelying to Sit Rolling: Min assist Sidelying to sit: Min assist       General bed mobility comments: Pt instructed in logroll. +rail  Transfers Overall transfer level: Needs assistance Equipment used: Rolling walker (2 wheeled) Transfers: Sit to/from [employer] Sit to Stand: Min assist Stand pivot transfers: Min assist       General transfer comment: verbal cues for sequencing, increased time to complete  Ambulation/Gait Ambulation/Gait  assistance: Min assist Ambulation Distance (Feet): 15 Feet Assistive device: Rolling walker (2 wheeled) Gait Pattern/deviations: Step-through pattern;Decreased stride length;Trunk flexed Gait velocity: decreased Gait velocity interpretation: Below normal speed for age/gender General Gait Details: knee buckling bilat. Generalized weakness BLE  Stairs            Wheelchair Mobility    Modified Rankin (Stroke Patients Only)       Balance                                             Pertinent Vitals/Pain Pain Assessment: Faces Faces Pain Scale: Hurts a little bit Pain Location: back Pain Descriptors / Indicators: Guarding Pain Intervention(s): Monitored during session;Repositioned    Home Living Family/patient expects to be discharged to:: Private residence Living Arrangements: Spouse/significant other;Children Available Help at Discharge: Family;Available 24 hours/day Type of Home: House Home Access: Stairs to enter Entrance Stairs-Rails: Right Entrance Stairs-Number of Steps: 5 Home Layout: Two level;Able to live on main level with bedroom/bathroom Home Equipment: Dan Humphreys - 2 wheels;Shower seat - built in;Grab bars - toilet;Grab bars - tub/shower      Prior Function Level of Independence: Independent         Comments: Family assists with cooking and housekeeping.     Hand Dominance   Dominant Hand: Right    Extremity/Trunk Assessment   Upper Extremity Assessment Upper Extremity Assessment: Generalized weakness    Lower Extremity Assessment Lower Extremity Assessment: Generalized weakness    Cervical / Trunk Assessment Cervical / Trunk Assessment: Kyphotic  Communication   Communication: No difficulties  Cognition Arousal/Alertness: Awake/alert Behavior During Therapy:  WFL for tasks assessed/performed Overall Cognitive Status: Impaired/Different from baseline Area of Impairment: Memory;Safety/judgement;Problem solving      Memory: Decreased short-term memory   Safety/Judgement: Decreased awareness of safety;Decreased awareness of deficits   Problem Solving: Slow processing;Decreased initiation;Requires verbal cues      General Comments      Exercises     Assessment/Plan    PT Assessment Patient needs continued PT services  PT Problem List Decreased strength;Decreased activity tolerance;Decreased balance;Decreased mobility;Decreased safety awareness;Decreased knowledge of precautions;Decreased knowledge of use of DME;Pain;Decreased cognition       PT Treatment Interventions DME instruction;Gait training;Stair training;Functional mobility training;Balance training;Therapeutic exercise;Therapeutic activities;Patient/family education    PT Goals (Current goals can be found in the Care Plan section)  Acute Rehab PT Goals Patient Stated Goal: home PT Goal Formulation: With patient/family Time For Goal Achievement: 06/04/16 Potential to Achieve Goals: Good    Frequency Min 3X/week   Barriers to discharge        Co-evaluation               End of Session Equipment Utilized During Treatment: Gait belt;Back brace Activity Tolerance: Patient tolerated treatment well Patient left: in chair;with chair alarm set;with call bell/phone within reach;with family/visitor present Nurse Communication: Mobility status PT Visit Diagnosis: Muscle weakness (generalized) (M62.81)         Time:  -      Charges:         PT G Codes:         Ilda FoilGarrow, Quinci Gavidia Rene 05/21/2016, 10:46 AM

## 2016-05-22 DIAGNOSIS — R4182 Altered mental status, unspecified: Secondary | ICD-10-CM | POA: Diagnosis not present

## 2016-05-22 DIAGNOSIS — M4854XA Collapsed vertebra, not elsewhere classified, thoracic region, initial encounter for fracture: Secondary | ICD-10-CM

## 2016-05-22 DIAGNOSIS — G9340 Encephalopathy, unspecified: Secondary | ICD-10-CM | POA: Diagnosis not present

## 2016-05-22 DIAGNOSIS — I481 Persistent atrial fibrillation: Secondary | ICD-10-CM | POA: Diagnosis not present

## 2016-05-22 LAB — COMPREHENSIVE METABOLIC PANEL
ALT: 12 U/L — ABNORMAL LOW (ref 14–54)
AST: 20 U/L (ref 15–41)
Albumin: 3 g/dL — ABNORMAL LOW (ref 3.5–5.0)
Alkaline Phosphatase: 74 U/L (ref 38–126)
Anion gap: 4 — ABNORMAL LOW (ref 5–15)
BILIRUBIN TOTAL: 0.6 mg/dL (ref 0.3–1.2)
BUN: 25 mg/dL — AB (ref 6–20)
CHLORIDE: 100 mmol/L — AB (ref 101–111)
CO2: 30 mmol/L (ref 22–32)
Calcium: 8.6 mg/dL — ABNORMAL LOW (ref 8.9–10.3)
Creatinine, Ser: 1.05 mg/dL — ABNORMAL HIGH (ref 0.44–1.00)
GFR, EST AFRICAN AMERICAN: 53 mL/min — AB (ref >60)
GFR, EST NON AFRICAN AMERICAN: 45 mL/min — AB (ref >60)
Glucose, Bld: 93 mg/dL (ref 65–99)
POTASSIUM: 3.8 mmol/L (ref 3.5–5.1)
Sodium: 134 mmol/L — ABNORMAL LOW (ref 135–145)
TOTAL PROTEIN: 6.1 g/dL — AB (ref 6.5–8.1)

## 2016-05-22 MED ORDER — CEFPODOXIME PROXETIL 100 MG PO TABS: 100.0000 mg | ORAL_TABLET | Freq: Two times a day (BID) | ORAL | 0 refills | Status: DC

## 2016-05-22 MED ORDER — LEVOTHYROXINE SODIUM 50 MCG PO TABS
62.5000 ug | ORAL_TABLET | Freq: Every day | ORAL | Status: DC
  Administered 2016-05-23: 62.5 ug via ORAL
  Filled 2016-05-22: qty 1

## 2016-05-22 MED ORDER — MECLIZINE HCL 25 MG PO TABS
12.5000 mg | ORAL_TABLET | Freq: Three times a day (TID) | ORAL | Status: DC | PRN
  Administered 2016-05-22 (×2): 12.5 mg via ORAL
  Filled 2016-05-22 (×2): qty 1

## 2016-05-22 MED ORDER — LEVOTHYROXINE SODIUM 125 MCG PO TABS: 62.5000 ug | ORAL_TABLET | Freq: Every day | ORAL | Status: DC

## 2016-05-22 NOTE — Progress Notes (Signed)
Physical Therapy Treatment Patient Details Name: Caitlyn AlaminVirginia Cedotal MRN: [id number] DOB: 1924-02-09 Today's Date: 05/22/2016    History of Present Illness 83 y.o. female with medical history significant for anemia, A. fib, TIA, hypertension, and hyperthyroidism. She presented to the emergency department with weakness, altered mental status and fall.  Pt admitted for UTI, acute encephalopathy, and thoracic comp fx.     PT Comments    Great improvement noted. Pt is more clear cognitively and requiring decreased assist with mobility. PT to continue per POC with plan for d/c home with HHPT and 24-hour family assist.   Follow Up Recommendations  Home health PT;Supervision/Assistance - 24 hour     Equipment Recommendations  None recommended by PT    Recommendations for Other Services       Precautions / Restrictions Precautions Precautions: Back;Fall Precaution Comments: educated on back precautions for comfort Required Braces or Orthoses: Spinal Brace Spinal Brace: Thoracolumbosacral orthotic;Applied in sitting position Restrictions Weight Bearing Restrictions: No Other Position/Activity Restrictions: Upon entering room, pt wearing TLSO in bed. She reports sleeping in it last night. Educated pt on brace wear when OOB.    Mobility  Bed Mobility     Rolling: Modified independent (Device/Increase time) Sidelying to sit: Min assist       General bed mobility comments: Verbal cues and min assist for logroll technique.  Transfers   Equipment used: Rolling walker (2 wheeled)   Sit to Stand: Min guard Stand pivot transfers: Min guard       General transfer comment: verbal cues for sequencing and hand placement  Ambulation/Gait Ambulation/Gait assistance: Min guard Ambulation Distance (Feet): 40 Feet Assistive device: Rolling walker (2 wheeled) Gait Pattern/deviations: Step-through pattern;Trunk flexed;Decreased stride length Gait velocity: decreased Gait velocity  interpretation: Below normal speed for age/gender General Gait Details: verbal cues for posture   Stairs            Wheelchair Mobility    Modified Rankin (Stroke Patients Only)       Balance                                    Cognition Arousal/Alertness: Awake/alert Behavior During Therapy: WFL for tasks assessed/performed Overall Cognitive Status: Within Functional Limits for tasks assessed                 General Comments: Pt appears to be back to baseline cognitively.    Exercises General Exercises - Lower Extremity Ankle Circles/Pumps: AROM;Both;20 reps    General Comments        Pertinent Vitals/Pain Pain Assessment: 0-10 Pain Score: 6  Pain Location: back Pain Descriptors / Indicators: Sore Pain Intervention(s): Monitored during session;Repositioned    Home Living                      Prior Function            PT Goals (current goals can now be found in the care plan section) Acute Rehab PT Goals Patient Stated Goal: home PT Goal Formulation: With patient/family Time For Goal Achievement: 06/04/16 Potential to Achieve Goals: Good Progress towards PT goals: Progressing toward goals    Frequency    Min 3X/week      PT Plan Current plan remains appropriate    Co-evaluation             End of Session Equipment Utilized During Treatment: Gait belt;Back brace Activity  Tolerance: Patient tolerated treatment well Patient left: in chair;with chair alarm set;with call bell/phone within reach;with family/visitor present Nurse Communication: Mobility status PT Visit Diagnosis: Muscle weakness (generalized) (M62.81)     Time: 6045-4098 PT Time Calculation (min) (ACUTE ONLY): 29 min  Charges:  $Gait Training: 23-37 mins                    G Codes:  Functional Assessment Tool Used: AM-PAC 6 Clicks Basic Mobility Functional Limitation: Mobility: Walking and moving around Mobility: Walking and Moving Around  Current Status (J1914): At least 20 percent but less than 40 percent impaired, limited or restricted Mobility: Walking and Moving Around Goal Status [phone]): At least 20 percent but less than 40 percent impaired, limited or restricted    Ilda Foil 05/22/2016, 11:18 AM

## 2016-05-22 NOTE — Progress Notes (Signed)
PROGRESS NOTE    [place]  RUE:[id number] DOB: 14-Jan-1924 DOA: 05/20/2016 PCP: Aida Puffer, MD    Brief Narrative:  83 y.o. female with medical history significant for anemia, A. fib on our quests, history TIA, hypertension, hyperthyroidism presents to the emergency department with the chief complaint is weakness and altered mental status and fall. Initial evaluation reveals urinary tract infection dehydration  Information is obtained from the patient and the chart and the daughter who is at the bedside noting that information from patient may be unreliable do to acute encephalopathy. Daughter reports patient has experienced what sounds like 2 mechanical falls over the last week. One episode she was going up the stairs grabbing the side relative admit. Second episode she was reaching for doorknob to open a door missed the doorknob fell backwards. There was no loss of consciousness on either event. She did her head on the second episode. She was able to get up under her own 16 but complained of soreness afterwards. He was evaluated at that time with a chest x-ray that was unremarkable. Daughter reports this morning she fell again at this time she was unable to get up off the floor. Associated symptoms include decreased oral intake and episodes of "confusion" no reports of any complaints of headache dizziness chest pain palpitations shortness of breath. Patient denies abdominal pain nausea vomiting diarrhea. Daughter reports it's "several days" since her last bowel movement. Patient denies any dysuria hematuria frequency or urgency.  Assessment & Plan:   Principal Problem:   Acute encephalopathy Active Problems:   Hypertension   Hypothyroidism   Aortic stenosis, mild   UTI (urinary tract infection)   Compression fracture of T12 vertebra (HCC)   Atrial fibrillation, persistent (HCC)   Altered mental status  #1. Acute encephalopathy possibly secondary to dehydration vs possible  underlying dementia -Urinalysis neg for WBC's, neg for bacteria, but is pos only for nitrites. Urine culture unremarkable - CT of the head negative for any acute abnormality. Neuro exam benign.  No indication of metabolic derangement.  - TSH mildly elevated, increased levothyroxine dose - Mental status change noted to be intermittent and worse at night - question sundowning? -No sedating meds noted -Clinically much improved today  #2. Leukocytosis - unclear etiology, possible UTI  -IV fluids as noted above -UA with no wbc, no leukocytes, no bacteria -Afebrile -WBC normalized overnight with abx. For now, will continue empirically -Urine cx unremarkable - Possible leukocytosis related to compression fx - Continue empiric abx  #3. Compression fracture of T12. Related to mechanical fall. Xray thoracic spine reveals fx. she complains of pain with movement. -Continue with pain control -Physical therapy -Chart reviewed. Neurosurgery recommends brace with outpatient f/u - Stable currently  4. Atrial fibrillation.[place] score 5.  - Home medications include eliquis and metoprolol -continue home meds for now - Remains stable at present  5. Vertigo - Pt today reported sudden dizziness worse with head turn, improved with holding head still - Recent CT head unremarkable - Give trial of meclizine with improvement  DVT prophylaxis: Eliquis Code Status: No intubation, No CPR // OK for BiPAP, ACLS meds, Cardioversion Family Communication: Pt in room, family at bedside Disposition Plan: Uncertain at this time  Consultants:   Psychiatry  Procedures:     Antimicrobials: Anti-infectives    Start     Dose/Rate Route Frequency Ordered Stop   05/22/16 0000  cefpodoxime (VANTIN) 100 MG tablet     100 mg Oral 2 times daily 05/22/16 1153  05/21/16 1000  cefTRIAXone (ROCEPHIN) 1 g in dextrose 5 % 50 mL IVPB     1 g 100 mL/hr over 30 Minutes Intravenous Every 24 hours 05/20/16 1400      05/20/16 1300  cefTRIAXone (ROCEPHIN) 1 g in dextrose 5 % 50 mL IVPB     1 g 100 mL/hr over 30 Minutes Intravenous  Once 05/20/16 1253 05/20/16 1531      Subjective: Overall much improved mentation, however now complaining of dizziness and nausea  Objective: Vitals:   05/21/16 2300 05/22/16 0217 05/22/16 1205 05/22/16 1319  BP: 132/60  (!) 147/63 (!) 157/71  Pulse: 67  73 (!) 59  Resp:      Temp:      TempSrc:      SpO2:   100%   Weight:  48.1 kg (106 lb)    Height:        Intake/Output Summary (Last 24 hours) at 05/22/16 1603 Last data filed at 05/21/16 1700  Gross per 24 hour  Intake               50 ml  Output                0 ml  Net               50 ml   Filed Weights   05/20/16 0921 05/21/16 0052 05/22/16 0217  Weight: 49 kg (108 lb) 48.1 kg (106 lb) 48.1 kg (106 lb)    Examination:  General exam: Sitting in chair, conversant, in nad Respiratory system: Normal resp effort, no audible wheezing Cardiovascular system: regular rate, s1, s2 Gastrointestinal system: soft, nondistended, pos BS Central nervous system: cn2-12 grossly intact, strength intact Extremities: perfused, no clubbing Skin: Normal skin turgor, no notable skin lesions seen Psychiatry:mood normal// no visual hallucinations  Data Reviewed: I have personally reviewed following labs and imaging studies  CBC:  Recent Labs Lab 05/20/16 1044 05/21/16 0430  WBC 11.5* 7.9  NEUTROABS 8.9*  --   HGB 13.4 12.3  HCT 40.5 38.0  MCV 90.2 91.8  PLT 233 234   Basic Metabolic Panel:  Recent Labs Lab 05/20/16 1044 05/21/16 0430 05/22/16 0423  NA 136 134* 134*  K 4.2 4.0 3.8  CL 100* 96* 100*  CO2 25 28 30   GLUCOSE 131* 107* 93  BUN 21* 26* 25*  CREATININE 0.99 0.97 1.05*  CALCIUM 9.6 9.2 8.6*   GFR: Estimated Creatinine Clearance: 27 mL/min (by C-G formula based on SCr of 1.05 mg/dL (H)). Liver Function Tests:  Recent Labs Lab 05/22/16 0423  AST 20  ALT 12*  ALKPHOS 74  BILITOT  0.6  PROT 6.1*  ALBUMIN 3.0*   No results for input(s): LIPASE, AMYLASE in the last 168 hours. No results for input(s): AMMONIA in the last 168 hours. Coagulation Profile: No results for input(s): INR, PROTIME in the last 168 hours. Cardiac Enzymes: No results for input(s): CKTOTAL, CKMB, CKMBINDEX, TROPONINI in the last 168 hours. BNP (last 3 results) No results for input(s): PROBNP in the last 8760 hours. HbA1C: No results for input(s): HGBA1C in the last 72 hours. CBG: No results for input(s): GLUCAP in the last 168 hours. Lipid Profile: No results for input(s): CHOL, HDL, LDLCALC, TRIG, CHOLHDL, LDLDIRECT in the last 72 hours. Thyroid Function Tests:  Recent Labs  05/20/16 1343 05/21/16 0856  TSH 5.549*  --   FREET4  --  1.18*   Anemia Panel:  Recent Labs  05/20/16 1343  VITAMINB12 1,198*   Sepsis Labs: No results for input(s): PROCALCITON, LATICACIDVEN in the last 168 hours.  Recent Results (from the past 240 hour(s))  Urine culture     Status: Abnormal   Collection Time: 05/20/16 11:40 AM  Result Value Ref Range Status   Specimen Description URINE, RANDOM  Final   Special Requests NONE  Final   Culture MULTIPLE SPECIES PRESENT, SUGGEST RECOLLECTION (A)  Final   Report Status 05/21/2016 FINAL  Final     Radiology Studies: No results found.  Scheduled Meds: . apixaban  2.5 mg Oral BID  . cefTRIAXone (ROCEPHIN)  IV  1 g Intravenous Q24H  . ferrous gluconate  324 mg Oral Daily  . [START ON 05/23/2016] levothyroxine  62.5 mcg Oral QAC breakfast  . metoprolol tartrate  12.5 mg Oral BID  . [START ON 05/24/2016] Vitamin D (Ergocalciferol)  50,000 Units Oral Weekly   Continuous Infusions:   LOS: 0 days   CHIU, Scheryl MartenSTEPHEN K, MD [facility] Pager [phone]  If 7PM-7AM, please contact night-coverage [email or web address] Password [email or web address] 05/22/2016, 4:03 PM

## 2016-05-22 NOTE — Care Management Note (Signed)
Case Management Note  Patient Details  Name: Caitlyn Henry MRN: [id number] Date of Birth: 22-Feb-1924  Subjective/Objective:  83 y.o. To be discharged home with HHPT and HHSW. [place]. [facility] will provide 3n1 and HH services.                   Action/Plan:CM will sign off for now but will be available should additional discharge needs arise or disposition change.    Expected Discharge Date:                  Expected Discharge Plan:  Home w Home Health Services  In-House Referral:  NA  Discharge planning Services  CM Consult  Post Acute Care Choice:  Durable Medical Equipment, Home Health (Has RW) Choice offered to:  Patient, Adult Children  DME Arranged:  3-N-1 DME Agency:  [facility]  [facility]  [facility]  [facility]  Status of Service:  Completed, signed off  If discussed at Long Length of Stay Meetings, dates discussed:    Additional Comments:  Yvone NeuCrutchfield, Cheila Wickstrom M, RN 05/22/2016, 12:29 PM

## 2016-05-23 DIAGNOSIS — I10 Essential (primary) hypertension: Secondary | ICD-10-CM

## 2016-05-23 DIAGNOSIS — I481 Persistent atrial fibrillation: Secondary | ICD-10-CM | POA: Diagnosis not present

## 2016-05-23 DIAGNOSIS — M4854XA Collapsed vertebra, not elsewhere classified, thoracic region, initial encounter for fracture: Secondary | ICD-10-CM | POA: Diagnosis not present

## 2016-05-23 DIAGNOSIS — G9340 Encephalopathy, unspecified: Secondary | ICD-10-CM | POA: Diagnosis not present

## 2016-05-23 LAB — FOLATE RBC
FOLATE, HEMOLYSATE: 518.2 ng/mL
FOLATE, RBC: 1261 ng/mL (ref >498)
Hematocrit: 41.1 % (ref 34.0–46.6)

## 2016-05-23 MED ORDER — MECLIZINE HCL 12.5 MG PO TABS: 12.5000 mg | ORAL_TABLET | Freq: Three times a day (TID) | ORAL | 0 refills | Status: DC | PRN

## 2016-05-23 MED ORDER — CEFPODOXIME PROXETIL 200 MG PO TABS: 200.0000 mg | ORAL_TABLET | Freq: Every day | ORAL | 0 refills | Status: DC

## 2016-05-23 MED ORDER — CEFPODOXIME PROXETIL 200 MG PO TABS: 200.0000 mg | ORAL_TABLET | Freq: Two times a day (BID) | ORAL | Status: DC

## 2016-05-23 MED ORDER — CEFPODOXIME PROXETIL 200 MG PO TABS
200.0000 mg | ORAL_TABLET | Freq: Two times a day (BID) | ORAL | Status: DC
Start: 2016-05-23 — End: 2016-05-23

## 2016-05-23 MED ORDER — CEFPODOXIME PROXETIL 200 MG PO TABS
200.0000 mg | ORAL_TABLET | Freq: Every day | ORAL | Status: DC
  Administered 2016-05-23: 200 mg via ORAL
  Filled 2016-05-23: qty 1

## 2016-05-23 NOTE — Progress Notes (Signed)
Physical Therapy Treatment Patient Details Name: Caitlyn AlaminVirginia Khiev MRN: [id number] DOB: 1923-04-27 Today's Date: 05/23/2016    History of Present Illness 83 y.o. female with medical history significant for anemia, A. fib, TIA, hypertension, and hyperthyroidism. She presented to the emergency department with weakness, altered mental status and fall.  Pt admitted for UTI, acute encephalopathy, and thoracic comp fx.     PT Comments    Pt admitted with above diagnosis. Pt currently with functional limitations due to the deficits listed below (see PT Problem List). Pt with positive BPPV bil directions.  Treated with canalith repositioning bil directions for posterior canal.  Pt dizziness from 8/10 to 4/10 after treatment.  All education complete with family and pt.  They feel comfortable taking pt home with HHPT as below.  Instructed to use RW at all times and have 24 hour assist.   Pt will benefit from skilled PT to increase their independence and safety with mobility to allow discharge to the venue listed below.     Follow Up Recommendations  Home health PT;Supervision/Assistance - 24 hour (for vestibular rehab)     Equipment Recommendations  None recommended by PT    Recommendations for Other Services       Precautions / Restrictions Precautions Precautions: Back;Fall Precaution Comments: educated on back precautions for comfort Required Braces or Orthoses: Spinal Brace Spinal Brace: Thoracolumbosacral orthotic;Applied in sitting position Restrictions Weight Bearing Restrictions: No    Mobility  Bed Mobility Overal bed mobility: Needs Assistance Bed Mobility: Rolling;Sidelying to Sit Rolling: Modified independent (Device/Increase time) Sidelying to sit: Min assist       General bed mobility comments: Verbal cues and min assist for logroll technique.  Transfers Overall transfer level: Needs assistance Equipment used: Rolling walker (2 wheeled) Transfers: Sit to/from  [employer] CorporationStand;Stand Pivot Transfers Sit to Stand: Min guard Stand pivot transfers: Min guard       General transfer comment: verbal cues for sequencing and hand placement. Educated pt and family on brace application as they had seen but had not been able to perform.    Ambulation/Gait Ambulation/Gait assistance: Min guard;Min assist Ambulation Distance (Feet): 60 Feet (30 feet x 2) Assistive device: Rolling walker (2 wheeled) Gait Pattern/deviations: Step-through pattern;Trunk flexed;Decreased stride length Gait velocity: decreased Gait velocity interpretation: Below normal speed for age/gender General Gait Details: verbal cues for posture as pt tends to lean forward.  Had to assist by steering RW.  Pt needed cues for technique.  Cues also for compensation with dizziness.    Stairs            Wheelchair Mobility    Modified Rankin (Stroke Patients Only)       Balance Overall balance assessment: Needs assistance;History of Falls Sitting-balance support: No upper extremity supported;Feet supported Sitting balance-Leahy Scale: Fair     Standing balance support: Bilateral upper extremity supported;During functional activity Standing balance-Leahy Scale: Poor Standing balance comment: relies on UE support with standing.  Needed support while washing hands at sink.                     Cognition Arousal/Alertness: Awake/alert Behavior During Therapy: WFL for tasks assessed/performed Overall Cognitive Status: Within Functional Limits for tasks assessed Area of Impairment: Memory;Safety/judgement;Problem solving     Memory: Decreased short-term memory   Safety/Judgement: Decreased awareness of safety;Decreased awareness of deficits   Problem Solving: Slow processing;Decreased initiation;Requires verbal cues General Comments: Pt appears to be back to baseline cognitively.    Exercises General Exercises -  Lower Extremity Ankle Circles/Pumps: AROM;Both;20 reps Other  Exercises Other Exercises: Austin Miles discussed and handout given.    General Comments General comments (skin integrity, edema, etc.): Pt initially 8./10 dizzy.  Pt tested positive for right BPPV  first and treated with canalith repositioning maneuver. Had to use bed to position pt due to back precautions.   Pt still 7/10 dizzy.  Ambulated with pt and pt still dizzy.  Treated for left BPPV and dizziness at end of treatment to 4/10.  Pt with noticeable improvement.  Taught pt and family about compensation strategies, BPPV handout, Epley handout, and  Therapist, art.   Also discussed that pt can take Antivert as prescribed but also discussed that it suppresses the system and that therapy is best if medicine not taken long term.  Pt and family agree with all of the above.       Pertinent Vitals/Pain Pain Assessment: Faces Faces Pain Scale: Hurts a little bit Pain Location: back Pain Descriptors / Indicators: Sore Pain Intervention(s): Limited activity within patient's tolerance;Monitored during session;Premedicated before session;Repositioned  VSS  Home Living                      Prior Function            PT Goals (current goals can now be found in the care plan section) Acute Rehab PT Goals Patient Stated Goal: home Progress towards PT goals: Progressing toward goals    Frequency    Min 3X/week      PT Plan Current plan remains appropriate    Co-evaluation             End of Session Equipment Utilized During Treatment: Gait belt;Back brace Activity Tolerance: Patient limited by pain;Patient limited by fatigue Patient left: with call bell/phone within reach;with family/visitor present;in bed Nurse Communication: Mobility status PT Visit Diagnosis: Muscle weakness (generalized) (M62.81);Dizziness and giddiness (R42)     Time: 1610-9604 PT Time Calculation (min) (ACUTE ONLY): 64 min  Charges:  $Gait Training: 8-22 mins $Therapeutic Exercise: 8-22  mins $Therapeutic Activity: 8-22 mins $Canalith Rep Proc: 8-22 mins                    G Codes:       Berline Lopes 2016/06/09, 4:16 PM  [employer] Acute Rehabilitation [phone] (pager)

## 2016-05-23 NOTE — Discharge Summary (Signed)
Physician Discharge Summary  Caitlyn AlaminVirginia Henry ZOX:[id number] DOB: [birth year] DOA: 05/20/2016  PCP: Aida PufferLITTLE,JAMES, MD  Admit date: 05/20/2016 Discharge date: 05/23/2016  Admitted From: Home Disposition:  Home  Recommendations for Outpatient Follow-up:  1. Follow up with PCP in 1-2 weeks 2. If no improvement in mentation, consider dementia work up  Home Health:PT/SW  Equipment/Devices:commode    Discharge Condition:Stable CODE STATUS: No intubation, No CPR // OK for BiPAP, ACLS meds, Cardioversion Diet recommendation: Heart healthy   Brief/Interim Summary: 83 y.o.femalewith medical history significant for anemia, A. fib on our quests, history TIA, hypertension, hyperthyroidism presents to the emergency department with the chief complaint is weakness and altered mental status and fall. Initial evaluation reveals urinary tract infection dehydration  Information is obtained from the patient and the chart and the daughter who is at the bedside noting that information from patient may be unreliable do to acute encephalopathy. Daughter reports patient has experienced what sounds like 2 mechanical falls over the last week. One episode she was going up the stairs grabbing the side relative admit. Second episode she was reaching for doorknob to open a door missed the doorknob fell backwards. There was no loss of consciousness on either event. She did her head on the second episode. She was able to get up under her own 16 but complained of soreness afterwards. He was evaluated at that time with a chest x-ray that was unremarkable. Daughter reports this morning she fell again at this time she was unable to get up off the floor. Associated symptoms include decreased oral intake and episodes of "confusion" no reports of any complaints of headache dizziness chest pain palpitations shortness of breath. Patient denies abdominal pain nausea vomiting diarrhea. Daughter reports it's "several days" since her last bowel  movement. Patient denies any dysuria hematuria frequency or urgency.  #1. Acute encephalopathy possibly secondary to dehydration vs possible underlying dementia -Urinalysis neg for WBC's, neg for bacteria, but is pos only for nitrites. Urine culture was largely unremarkable - CT of the head negative for any acute abnormality. Neuro exam benign. No indication of metabolic derangement.  - TSH mildly elevated, increased levothyroxine dose - Mental status change noted to be intermittent and worse at night - question sundowning vs early UTI? -No sedating meds noted -Clinically much improved and at baseline per family  #2. Leukocytosis - unclear etiology, possible UTI  -IV fluids were given shortly after admission -UA with no wbc, no leukocytes, no bacteria -Afebrile -WBC normalized overnight with abx. For now, will continue empirically -Urine cx unremarkable - Possible leukocytosis related to compression fx - Plan to complete course of Vantin on discharge  #3. Compression fracture of T12. Related to mechanical fall. Xray thoracic spine reveals fx. she complains of pain with movement. -Continued with pain control -Physical therapy -Chart reviewed. Neurosurgery recommends brace with outpatient f/u - Stable currently  4. Atrial fibrillation.Italychad score 5.  - Home medications include eliquis and metoprolol -continue home meds for now - Remains stable at present  5. Vertigo - Pt hd reported sudden dizziness worse with head turn, improved with holding head still - Recent CT head unremarkable - Give trial of meclizine with notable improvement - Plan to continue PT  Discharge Diagnoses:  Principal Problem:   Acute encephalopathy Active Problems:   Hypertension   Hypothyroidism   Aortic stenosis, mild   UTI (urinary tract infection)   Compression fracture of body of thoracic vertebra (HCC)   Atrial fibrillation, persistent (HCC)   Altered mental status  Discharge  Instructions   Allergies as of 05/23/2016      Reactions   Tylenol [acetaminophen] Other (See Comments)   Makes my body feel numb      Medication List    TAKE these medications   AZO URINARY TRACT SUPPORT PO Take 1 capsule by mouth daily as needed (uti).   cefpodoxime 200 MG tablet Commonly known as:  VANTIN Take 1 tablet (200 mg total) by mouth daily.   ELIQUIS 2.5 MG Tabs tablet Generic drug:  apixaban TAKE 1 TABLET BY MOUTH TWICE A DAY   ferrous gluconate 240 (27 FE) MG tablet Commonly known as:  FERGON Take 240 mg by mouth daily.   levothyroxine 50 MCG tablet Commonly known as:  SYNTHROID, LEVOTHROID Take 50 mcg by mouth daily. What changed:  Another medication with the same name was added. Make sure you understand how and when to take each.   levothyroxine 125 MCG tablet Commonly known as:  SYNTHROID, LEVOTHROID Take 0.5 tablets (62.5 mcg total) by mouth daily before breakfast. What changed:  You were already taking a medication with the same name, and this prescription was added. Make sure you understand how and when to take each.   magnesium hydroxide 400 MG/5ML suspension Commonly known as:  MILK OF MAGNESIA Take 15 mLs by mouth daily as needed for mild constipation.   meclizine 12.5 MG tablet Commonly known as:  ANTIVERT Take 1 tablet (12.5 mg total) by mouth 3 (three) times daily as needed for dizziness or nausea.   metoprolol tartrate 25 MG tablet Commonly known as:  LOPRESSOR Take 1/2 tablet twice a day What changed:  how much to take  how to take this  when to take this  additional instructions   multivitamin tablet Take 1 tablet by mouth daily.   naproxen sodium 220 MG tablet Commonly known as:  ANAPROX Take 220 mg by mouth 2 (two) times daily as needed (pain).   vitamin B-12 100 MCG tablet Commonly known as:  CYANOCOBALAMIN Take 100 mcg by mouth daily.   Vitamin D (Ergocalciferol) 50000 units Caps capsule Commonly known as:   DRISDOL Take 50,000 Units by mouth once a week.            Durable Medical Equipment        Start     Ordered   05/22/16 1143  For home use only DME Bedside commode  Once    Question:  Patient needs a bedside commode to treat with the following condition  Answer:  Compression fracture of lumbar vertebra (HCC)   05/22/16 1143     Follow-up Information    LITTLE,JAMES, MD. Schedule an appointment as soon as possible for a visit in 1 week(s).   Specialty:  Family Medicine Contact information: [address] [place] [address] [phone]        Inc[facility] - [facility] Follow up.   Why:  3n1 will be delivered to your room prior to discharge.  Contact information: [address] [place] [address] [phone]        [facility] Follow up.   Why:  HHPT, SW has been ordered by your MD and a representative from the agency will be in touch with you to arrange initial visit within 12-24 hours of discharge.  Contact information: [address] [place] [address] [phone]          Allergies  Allergen Reactions  . Tylenol [Acetaminophen] Other (  See Comments)    Makes my body feel numb     Procedures/Studies: Dg Chest 2 View  Result Date: 05/20/2016 CLINICAL DATA:  Fall twice within the past 2 weeks. EXAM: CHEST  2 VIEW COMPARISON:  05/17/2016 FINDINGS: Chronic changes within the upper lobes with scarring. Underline COPD. Mild cardiomegaly. No effusions or acute bony abnormality. IMPRESSION: COPD, advance chronic lung disease.  No active disease. Electronically Signed   By: Charlett Nose M.D.   On: 05/20/2016 12:24   Dg Ribs Unilateral W/chest Right  Result Date: 05/17/2016 CLINICAL DATA:  Larey Seat 1 week ago.  Persistent right-sided chest pain. EXAM: RIGHT RIBS AND CHEST - 3+ VIEW COMPARISON:  Chest x-ray 07/14/2015 FINDINGS: The cardiac silhouette, mediastinal and hilar contours are within normal limits and stable. Severe  chronic lung disease with emphysema and pulmonary scarring. Stable irregular right upper lobe density. No pleural effusion. Dedicated right rib films do not demonstrate any definite acute rib fractures. IMPRESSION: Chronic lung changes but no definite acute overlying pulmonary process. No definite acute right-sided rib fractures. Electronically Signed   By: Rudie Meyer M.D.   On: 05/17/2016 16:25   Dg Thoracic Spine 2 View  Result Date: 05/20/2016 CLINICAL DATA:  Has fallen twice in the past week, neck, back and RIGHT shoulder pain, on Eliquis for atrial fibrillation EXAM: THORACIC SPINE 2 VIEWS COMPARISON:  Chest radiographs 07/14/2015, chest CT 07/15/2015 FINDINGS: Diffuse osseous demineralization. Minimal chronic anterior height loss of a mid thoracic vertebra unchanged since prior CT. New superior endplate compression deformity of a lower thoracic vertebra, approximately T12, with approximately 25% anterior height loss. No additional fracture, subluxation or bone destruction. Mitral annular calcification noted. IMPRESSION: Osseous demineralization with new superior endplate compression fracture of T12 vertebral body with approximately 25% anterior height loss. Electronically Signed   By: Ulyses Southward M.D.   On: 05/20/2016 12:24   Dg Lumbar Spine Complete  Result Date: 05/20/2016 CLINICAL DATA:  Fall. EXAM: LUMBAR SPINE - COMPLETE 4+ VIEW COMPARISON:  05/17/2016 .  CT 07/15/2015. FINDINGS: Lumbar vertebra numbered with the lowest segmented appearing lumbar shaped vertebra as L5 . Diffuse osteopenia and degenerative change. Prominent scoliosis noted. Mild L1 and L2 compression fractures cannot be excluded. Lower thoracic vertebral body compression fractures cannot be excluded. Aortoiliac atherosclerotic vascular disease. IMPRESSION: 1. Cannot exclude mild L1 and L2 compression fractures. Lower thoracic vertebral body compression fractures cannot be excluded. If further evaluation of the thoracic and lumbar  spine needed MRI can be obtained. 2. Diffuse severe osteopenia.  Scoliosis. 3. Aortoiliac atherosclerotic vascular disease. Electronically Signed   By: Maisie Fus  Register   On: 05/20/2016 12:28   Dg Pelvis 1-2 Views  Result Date: 05/20/2016 CLINICAL DATA:  Fall. EXAM: PELVIS - 1-2 VIEW COMPARISON:  No recent . FINDINGS: Diffuse osteopenia and degenerative change. No acute bony or joint abnormality identified. Pelvic calcifications consistent phleboliths. Aortoiliac atherosclerotic vascular calcification. IMPRESSION: 1. Diffuse osteopenia degenerative change. No acute abnormality identified. 2. Aortoiliac atherosclerotic vascular disease. Electronically Signed   By: Maisie Fus  Register   On: 05/20/2016 12:22   Dg Shoulder Right  Result Date: 05/20/2016 CLINICAL DATA:  Larey Seat twice in last week, on Eliquis, right shoulder pain EXAM: RIGHT SHOULDER - 2+ VIEW COMPARISON:  None. FINDINGS: Two views of the right shoulder submitted. No acute fracture or subluxation. There is diffuse osteopenia. Mild to moderate degenerative changes AC joint. Minimal spurring of humeral head. IMPRESSION: No acute fracture or subluxation. Degenerative changes as described above. Diffuse osteopenia. Electronically Signed  By: Natasha Mead M.D.   On: 05/20/2016 12:22   Ct Head Wo Contrast  Result Date: 05/20/2016 CLINICAL DATA:  Larey Seat last week, hit back of the head on the carport EXAM: CT HEAD WITHOUT CONTRAST CT CERVICAL SPINE WITHOUT CONTRAST TECHNIQUE: Multidetector CT imaging of the head and cervical spine was performed following the standard protocol without intravenous contrast. Multiplanar CT image reconstructions of the cervical spine were also generated. COMPARISON:  07/14/2015 FINDINGS: CT HEAD FINDINGS Brain: No intracranial hemorrhage, mass effect or midline shift. Stable cerebral atrophy. Stable periventricular and patchy subcortical chronic white matter disease. No acute cortical infarction. No mass lesion is noted on this  unenhanced scan. Vascular: Atherosclerotic calcifications of carotid siphon and vertebral arteries again noted. Skull: No skull fracture.  No bony lesions are noted. Sinuses/Orbits: No acute findings. No paranasal sinuses air-fluid levels. Other: None CT CERVICAL SPINE FINDINGS Alignment: Normal alignment.  There is diffuse osteopenia. Skull base and vertebrae: No acute fracture or subluxation. Degenerative changes are noted C1-C2 articulation. Mild posterior spurring at C4-C5 and C6-C7 level. Mild anterior spurring lower endplate of C4 and C6 vertebral body. Soft tissues and spinal canal: No prevertebral soft tissue swelling. Spinal canal is patent. Disc levels: Mild disc space flattening at C5-C6 and C6-C7 level. Right side facet degenerative changes noted at C4-C5 level. Bilateral facet degenerative changes noted at C5-C6 level. Upper chest: There is no pneumothorax in lung apices. Bilateral apical pleuroparenchymal scarring. Probable scarring and chronic bronchiectasis in right upper lobe posteriorly. Other: None IMPRESSION: 1. No acute intracranial abnormality. Stable atrophy and chronic white matter disease. No definite acute cortical infarction. No skull fracture is noted. 2. No cervical spine acute fracture or subluxation. Diffuse osteopenia. Mild degenerative changes as described above. 3. Bilateral apical pleuroparenchymal scarring. Probable chronic bronchiectasis and fibrotic changes in right upper lobe posteriorly. Electronically Signed   By: Natasha Mead M.D.   On: 05/20/2016 11:31   Ct Cervical Spine Wo Contrast  Result Date: 05/20/2016 CLINICAL DATA:  Larey Seat last week, hit back of the head on the carport EXAM: CT HEAD WITHOUT CONTRAST CT CERVICAL SPINE WITHOUT CONTRAST TECHNIQUE: Multidetector CT imaging of the head and cervical spine was performed following the standard protocol without intravenous contrast. Multiplanar CT image reconstructions of the cervical spine were also generated. COMPARISON:   07/14/2015 FINDINGS: CT HEAD FINDINGS Brain: No intracranial hemorrhage, mass effect or midline shift. Stable cerebral atrophy. Stable periventricular and patchy subcortical chronic white matter disease. No acute cortical infarction. No mass lesion is noted on this unenhanced scan. Vascular: Atherosclerotic calcifications of carotid siphon and vertebral arteries again noted. Skull: No skull fracture.  No bony lesions are noted. Sinuses/Orbits: No acute findings. No paranasal sinuses air-fluid levels. Other: None CT CERVICAL SPINE FINDINGS Alignment: Normal alignment.  There is diffuse osteopenia. Skull base and vertebrae: No acute fracture or subluxation. Degenerative changes are noted C1-C2 articulation. Mild posterior spurring at C4-C5 and C6-C7 level. Mild anterior spurring lower endplate of C4 and C6 vertebral body. Soft tissues and spinal canal: No prevertebral soft tissue swelling. Spinal canal is patent. Disc levels: Mild disc space flattening at C5-C6 and C6-C7 level. Right side facet degenerative changes noted at C4-C5 level. Bilateral facet degenerative changes noted at C5-C6 level. Upper chest: There is no pneumothorax in lung apices. Bilateral apical pleuroparenchymal scarring. Probable scarring and chronic bronchiectasis in right upper lobe posteriorly. Other: None IMPRESSION: 1. No acute intracranial abnormality. Stable atrophy and chronic white matter disease. No definite acute cortical infarction.  No skull fracture is noted. 2. No cervical spine acute fracture or subluxation. Diffuse osteopenia. Mild degenerative changes as described above. 3. Bilateral apical pleuroparenchymal scarring. Probable chronic bronchiectasis and fibrotic changes in right upper lobe posteriorly. Electronically Signed   By: Natasha Mead M.D.   On: 05/20/2016 11:31    Subjective: Reports feeling well. Eager to go home  Discharge Exam: Vitals:   05/23/16 0536 05/23/16 1413  BP: (!) 154/91 (!) 146/85  Pulse: 81 78   Resp: 17 18  Temp: 98 F (36.7 C) 97.9 F (36.6 C)   Vitals:   05/22/16 1704 05/22/16 2302 05/23/16 0536 05/23/16 1413  BP: (!) 144/66 (!) 149/77 (!) 154/91 (!) 146/85  Pulse: 74 81 81 78  Resp: 18 17 17 18   Temp: 97.7 F (36.5 C) 97.9 F (36.6 C) 98 F (36.7 C) 97.9 F (36.6 C)  TempSrc: Oral Oral Oral   SpO2: 93% 98% 96% 98%  Weight:   47.9 kg (105 lb 9.6 oz)   Height:        General: Pt is alert, awake, not in acute distress Cardiovascular: RRR, S1/S2 +, no rubs, no gallops Respiratory: CTA bilaterally, no wheezing, no rhonchi Abdominal: Soft, NT, ND, bowel sounds + Extremities: no edema, no cyanosis   The results of significant diagnostics from this hospitalization (including imaging, microbiology, ancillary and laboratory) are listed below for reference.     Microbiology: Recent Results (from the past 240 hour(s))  Urine culture     Status: Abnormal   Collection Time: 05/20/16 11:40 AM  Result Value Ref Range Status   Specimen Description URINE, RANDOM  Final   Special Requests NONE  Final   Culture MULTIPLE SPECIES PRESENT, SUGGEST RECOLLECTION (A)  Final   Report Status 05/21/2016 FINAL  Final     Labs: BNP (last 3 results) No results for input(s): BNP in the last 8760 hours. Basic Metabolic Panel:  Recent Labs Lab 05/20/16 1044 05/21/16 0430 05/22/16 0423  NA 136 134* 134*  K 4.2 4.0 3.8  CL 100* 96* 100*  CO2 25 28 30   GLUCOSE 131* 107* 93  BUN 21* 26* 25*  CREATININE 0.99 0.97 1.05*  CALCIUM 9.6 9.2 8.6*   Liver Function Tests:  Recent Labs Lab 05/22/16 0423  AST 20  ALT 12*  ALKPHOS 74  BILITOT 0.6  PROT 6.1*  ALBUMIN 3.0*   No results for input(s): LIPASE, AMYLASE in the last 168 hours. No results for input(s): AMMONIA in the last 168 hours. CBC:  Recent Labs Lab 05/20/16 1044 05/20/16 1425 05/21/16 0430  WBC 11.5*  --  7.9  NEUTROABS 8.9*  --   --   HGB 13.4  --  12.3  HCT 40.5 41.1 38.0  MCV 90.2  --  91.8  PLT 233   --  234   Cardiac Enzymes: No results for input(s): CKTOTAL, CKMB, CKMBINDEX, TROPONINI in the last 168 hours. BNP: Invalid input(s): POCBNP CBG: No results for input(s): GLUCAP in the last 168 hours. D-Dimer No results for input(s): DDIMER in the last 72 hours. Hgb A1c No results for input(s): HGBA1C in the last 72 hours. Lipid Profile No results for input(s): CHOL, HDL, LDLCALC, TRIG, CHOLHDL, LDLDIRECT in the last 72 hours. Thyroid function studies No results for input(s): TSH, T4TOTAL, T3FREE, THYROIDAB in the last 72 hours.  Invalid input(s): FREET3 Anemia work up No results for input(s): VITAMINB12, FOLATE, FERRITIN, TIBC, IRON, RETICCTPCT in the last 72 hours. Urinalysis    Component  Value Date/Time   COLORURINE AMBER (A) 05/20/2016 1140   APPEARANCEUR HAZY (A) 05/20/2016 1140   LABSPEC 1.013 05/20/2016 1140   PHURINE 7.0 05/20/2016 1140   GLUCOSEU NEGATIVE 05/20/2016 1140   HGBUR SMALL (A) 05/20/2016 1140   BILIRUBINUR NEGATIVE 05/20/2016 1140   KETONESUR NEGATIVE 05/20/2016 1140   PROTEINUR 30 (A) 05/20/2016 1140   UROBILINOGEN 0.2 08/25/2014 1537   NITRITE POSITIVE (A) 05/20/2016 1140   LEUKOCYTESUR NEGATIVE 05/20/2016 1140   Sepsis Labs Invalid input(s): PROCALCITONIN,  WBC,  LACTICIDVEN Microbiology Recent Results (from the past 240 hour(s))  Urine culture     Status: Abnormal   Collection Time: 05/20/16 11:40 AM  Result Value Ref Range Status   Specimen Description URINE, RANDOM  Final   Special Requests NONE  Final   Culture MULTIPLE SPECIES PRESENT, SUGGEST RECOLLECTION (A)  Final   Report Status 05/21/2016 FINAL  Final     SIGNED:   Jerald Kief, MD  [facility] 05/23/2016, 3:04 PM  If 7PM-7AM, please contact night-coverage [email or web address] Password [email or web address]

## 2016-05-23 NOTE — Discharge Instructions (Signed)

## 2016-05-31 ENCOUNTER — Other Ambulatory Visit: Payer: Self-pay | Admitting: Cardiology

## 2016-05-31 DIAGNOSIS — I10 Essential (primary) hypertension: Secondary | ICD-10-CM

## 2016-05-31 DIAGNOSIS — I4891 Unspecified atrial fibrillation: Secondary | ICD-10-CM

## 2016-05-31 NOTE — Telephone Encounter (Signed)
Rx(s) sent to pharmacy electronically.  

## 2016-06-11 ENCOUNTER — Other Ambulatory Visit: Payer: Self-pay | Admitting: Interventional Cardiology

## 2016-06-11 ENCOUNTER — Telehealth: Payer: Self-pay | Admitting: Interventional Cardiology

## 2016-06-11 MED ORDER — AMOXICILLIN-POT CLAVULANATE ER 1000-62.5 MG PO TB12: 2.0000 | ORAL_TABLET | Freq: Two times a day (BID) | ORAL | 0 refills | Status: DC

## 2016-06-11 MED ORDER — AZITHROMYCIN 250 MG PO TABS: ORAL_TABLET | ORAL | 0 refills | Status: DC

## 2016-06-11 NOTE — Telephone Encounter (Addendum)
Patient's daughter called saying that her mother's CXR came back positive for infiltrate (we have no access to review it), and she has been having worsening cough. She says it would be too difficult to take her to ED, and she's asking for Abx prescription. Her PCP who ordered the test is not available over the weekend. I explained her the risks of antibiotics, especially if I can't personally examine the patient. She expressed understanding but says her mother is 83 yo, she can't take her to ED now, and doesn't want to wait till Monday. I prescribed Augmentin XR and Azithromycin. She will see PCP on Monday. She was explained to come to ED if she gets any worse.

## 2016-06-29 ENCOUNTER — Other Ambulatory Visit: Payer: Self-pay | Admitting: Cardiology

## 2016-06-29 DIAGNOSIS — I10 Essential (primary) hypertension: Secondary | ICD-10-CM

## 2016-06-29 DIAGNOSIS — I4891 Unspecified atrial fibrillation: Secondary | ICD-10-CM

## 2016-06-29 NOTE — Telephone Encounter (Signed)
Rx(s) sent to pharmacy electronically.  

## 2016-08-29 ENCOUNTER — Ambulatory Visit (INDEPENDENT_AMBULATORY_CARE_PROVIDER_SITE_OTHER): Payer: Medicare HMO | Admitting: Cardiology

## 2016-08-29 ENCOUNTER — Encounter: Payer: Self-pay | Admitting: Cardiology

## 2016-08-29 VITALS — BP 136/84 | HR 87 | Ht 65.0 in | Wt 98.0 lb

## 2016-08-29 DIAGNOSIS — I481 Persistent atrial fibrillation: Secondary | ICD-10-CM

## 2016-08-29 DIAGNOSIS — I350 Nonrheumatic aortic (valve) stenosis: Secondary | ICD-10-CM | POA: Diagnosis not present

## 2016-08-29 DIAGNOSIS — I10 Essential (primary) hypertension: Secondary | ICD-10-CM | POA: Diagnosis not present

## 2016-08-29 DIAGNOSIS — Z7189 Other specified counseling: Secondary | ICD-10-CM

## 2016-08-29 DIAGNOSIS — I4819 Other persistent atrial fibrillation: Secondary | ICD-10-CM

## 2016-08-29 NOTE — Patient Instructions (Signed)
No change with medication     You may hold eliquis 2- 3 days if needed for  Excessive bruising or bleeding then restart.    Your physician wants you to follow-up in 12 months with Dr Herbie BaltimoreHarding.You will receive a reminder letter in the mail two months in advance. If you don't receive a letter, please call our office to schedule the follow-up appointment.

## 2016-08-29 NOTE — Progress Notes (Signed)
PCP: Tamsen Roers, MD  Clinic Note: Chief Complaint  Patient presents with  . Follow-up  . Shortness of Breath  . Headache    HPI: Caitlyn Henry is a 83 y.o. female with a PMH below who presents today for Essentially annual follow-up for paroxysmal A. fib.Marland Kitchen She was admitted to [facility] on 07/14/2015 with weakness and decreased responsiveness.  She carries a diagnosis of Lyme disease that has been treated but still she has symptoms.  When she presented with TIA symptoms, she was in A. fib. The working diagnosis was that she had an embolic TIA related to her A. fib. She was relatively unaware of being in A. fib at that time  [place] was last seen In August 2017.  Recent Hospitalizations:   March -- altered mental status, with fall with compression fracture in back --just now getting close to her baseline  Acute encephalopathy thought to be related to dehydration related to dementia.   She also ruled out for UTI, but was completed on the course of MI.  Compression fracture of T12 related to the fall. Neurosurgery recommended brace and follow-up.  A. Fib - continued on beta blocker and was  Studies Personally Reviewed - (if available, images/films reviewed: From Epic Chart or Care Everywhere)  None  Interval History: Since I last saw Caitlyn, she has had multiple falls, at least 6 falls in the last 4 months. None have been related to any syncope or near-syncope, mostly were mechanical falls with poor balance. She had significant pain from her back and was given a prescription for Ultram which although it helps her pain, she is very confused and dizzy when she takes it. She's had falls as a result. From a cardiac standpoint however she is totally stable. She has no recollection or sensation of anything rapid irregular heartbeats to suggest recurrence of A. fib. He continues to deny any chest discomfort or dyspnea at rest and is not overly active to discuss exertional  symptoms. Despite her history, she denies a chest tightness or pressure with rest or exertion. No significant dyspnea with rest or exertion - beyond deconditioning   No PND, orthopnea or edema. No palpitations, lightheadedness, dizziness, weakness or syncope/near syncope. No TIA/amaurosis fugax symptoms. No claudication.  ROS: A comprehensive was performed. Review of Systems  HENT: Negative for congestion and nosebleeds.   Eyes: Negative.   Respiratory: Negative for cough, shortness of breath and wheezing.   Cardiovascular: Positive for palpitations (Rare palpitations. Nothing to suggest awareness of A. fib.).  Gastrointestinal: Positive for constipation. Negative for abdominal pain, blood in stool, heartburn and melena.  Genitourinary: Negative for frequency and hematuria.  Musculoskeletal: Positive for back pain (Related to her recent fall with spine fracture).  Neurological: Positive for dizziness (But not recently) and weakness (Global). Negative for focal weakness and headaches.  Psychiatric/Behavioral: Positive for memory loss.  All other systems reviewed and are negative.  I have reviewed and (if needed) personally updated the patient's problem list, medications, allergies, past medical and surgical history, social and family history.   Past Medical History:  Diagnosis Date  . Anemia   . Atrial fibrillation, persistent ([place]) 06/2015   Admitted with TIA. Diagnosed with A. fib. Rate control. On Eliquis.  . Hypothyroid   . Stroke [facility]) 06/2015   -TIA    Past Surgical History:  Procedure Laterality Date  . ABDOMINAL HYSTERECTOMY    . BLADDER SUSPENSION    . TRANSTHORACIC ECHOCARDIOGRAM  06/2015   In  setting of TIA.  Moderate basal hypertrophy. Vigorous function with EF 65-70%. Mild aortic stenosis with mean gradient 11 mmHg. Severe MAC with mild MR. Moderate LA dilation.    Current Meds  Medication Sig  . cefpodoxime (VANTIN) 200 MG tablet Take 1 tablet (200 mg total) by  mouth daily.  Marland Kitchen ELIQUIS 2.5 MG TABS tablet TAKE 1 TABLET BY MOUTH TWICE A DAY  . ferrous gluconate (FERGON) 240 (27 FE) MG tablet Take 240 mg by mouth daily.  Marland Kitchen levothyroxine (SYNTHROID, LEVOTHROID) 125 MCG tablet Take 0.5 tablets (62.5 mcg total) by mouth daily before breakfast.  . magnesium hydroxide (MILK OF MAGNESIA) 400 MG/5ML suspension Take 15 mLs by mouth daily as needed for mild constipation.  . meclizine (ANTIVERT) 12.5 MG tablet Take 1 tablet (12.5 mg total) by mouth 3 (three) times daily as needed for dizziness or nausea.  . metoprolol tartrate (LOPRESSOR) 25 MG tablet Take 0.5 tablets (12.5 mg total) by mouth 2 (two) times daily.  . Multiple Vitamin (MULTIVITAMIN) tablet Take 1 tablet by mouth daily.  . naproxen sodium (ANAPROX) 220 MG tablet Take 220 mg by mouth 2 (two) times daily as needed (pain).  . Phenazopyrid-Cranbry-C-Probiot (AZO URINARY TRACT SUPPORT PO) Take 1 capsule by mouth daily as needed (uti).  . vitamin B-12 (CYANOCOBALAMIN) 100 MCG tablet Take 100 mcg by mouth daily.  . Vitamin D, Ergocalciferol, (DRISDOL) 50000 units CAPS capsule Take 50,000 Units by mouth once a week.  . [DISCONTINUED] levothyroxine (SYNTHROID, LEVOTHROID) 50 MCG tablet Take 50 mcg by mouth daily.    Allergies  Allergen Reactions  . Tylenol [Acetaminophen] Other (See Comments)    Makes my body feel numb     Social History   Social History  . Marital status: Married    Spouse name: N/A  . Number of children: N/A  . Years of education: N/A   Social History Main Topics  . Smoking status: Never Smoker  . Smokeless tobacco: Never Used  . Alcohol use Yes     Comment: rarely  . Drug use: No  . Sexual activity: No   Other Topics Concern  . None   Social History Narrative  . None    family history includes Heart attack in her cousin.  Wt Readings from Last 3 Encounters:  08/29/16 98 lb (44.5 kg)  05/23/16 105 lb 9.6 oz (47.9 kg)  11/18/15 109 lb 3.2 oz (49.5 kg)     PHYSICAL EXAM BP 136/84   Pulse 87   Ht _0  (1.651 m)   Wt 98 lb (44.5 kg)   BMI 16.31 kg/m  General appearance: Thin, frail elderly woman who easily looks her stated age. She is in no acute distress. She answers questions somewhat appropriately, mostly history is provided by her daughter. HEENT: [place]/AT, EOMI, MMM, anicteric sclera Neck: no adenopathy, no carotid bruit and no JVD Lungs: clear to auscultation bilaterally, normal percussion bilaterally and non-labored Heart: Rate controlled irregularly irregular rhythm. Normal S1 and S2. 2/6 SEM at RUSB with no clear radiation. No R/G. Not associated lab. Abdomen: soft, non-tender; bowel sounds normal; no masses,  no organomegaly; no HJR Extremities: extremities normal, atraumatic, no cyanosis, and no significant Pulses: 2+ and symmetric;  Neurologic: Mental status: Alert & oriented x ~3, thought content appropriate; non-focal exam.  Pleasant mood & affect - but poor historian. Daughter answers questions today   Adult ECG Report  Rate: 87 ;  Rhythm: sinus arrhythmia and Poor R-wave progression, cannot exclude anterior MI, age  undetermined.;   Narrative Interpretation: Stable EKG   Other studies Reviewed: Additional studies/ records that were reviewed today include:  Recent Labs:   Lab Results  Component Value Date   CREATININE 1.05 (H) 05/22/2016   BUN 25 (H) 05/22/2016   NA 134 (L) 05/22/2016   K 3.8 05/22/2016   CL 100 (L) 05/22/2016   CO2 30 05/22/2016    ASSESSMENT / PLAN: Problem List Items Addressed This Visit    Aortic stenosis, mild (Chronic)    Mild stenosis noted on echocardiogram. With minimal murmur on exam, I would not follow-up unless things change.      Encounter for anticoagulation discussion and counseling    As noted, we had a discussion about when and if we stop ELIQUIS versus continuing -- related to her recent falls. Plan for now will be to continue.      Essential hypertension (Chronic)     Well-controlled. I would not want to be too aggressive with blood pressure control. On low-dose metoprolol with no further adjustment.      Persistent atrial fibrillation (HCC) -- CHA2DS2-VASc Score 6, on Low Dose Eliquis - Primary (Chronic)    She pretty much remains a symptomatically from A. fib standpoint. Rate well controlled with Lopressor. She remains on ELIQUIS for anticoagulation, but has had several falls. Again I discussed stroke risk versus fall risk with her daughter and I think for now we will continue ELIQUIS.  She is essentially asymptomatic from A. fib. With existing A. fib, she would be at risk for more frequent tachycardia, for now rate is controlled.         Current medicines are reviewed at length with the patient today. (+/- concerns)  - discussed falls & Eliquis The following changes have been made: n/  Patient Instructions  No change with medication     You may hold eliquis 2- 3 days if needed for  Excessive bruising or bleeding then restart.    Your physician wants you to follow-up in 12 months with Dr Ellyn Hack.You will receive a reminder letter in the mail two months in advance. If you don't receive a letter, please call our office to schedule the follow-up appointment.    Studies Ordered:   No orders of the defined types were placed in this encounter.     Glenetta Hew, M.D., M.S. Interventional Cardiologist   Pager # [phone] Phone # [phone] [address]. [place],  [address]

## 2016-09-01 ENCOUNTER — Encounter: Payer: Self-pay | Admitting: Cardiology

## 2016-09-01 NOTE — Assessment & Plan Note (Signed)
Well-controlled. I would not want to be too aggressive with blood pressure control. On low-dose metoprolol with no further adjustment.

## 2016-09-01 NOTE — Assessment & Plan Note (Signed)
As noted, we had a discussion about when and if we stop ELIQUIS versus continuing -- related to her recent falls. Plan for now will be to continue.

## 2016-09-01 NOTE — Assessment & Plan Note (Signed)
She pretty much remains a symptomatically from A. fib standpoint. Rate well controlled with Lopressor. She remains on ELIQUIS for anticoagulation, but has had several falls. Again I discussed stroke risk versus fall risk with her daughter and I think for now we will continue ELIQUIS.  She is essentially asymptomatic from A. fib. With existing A. fib, she would be at risk for more frequent tachycardia, for now rate is controlled.

## 2016-09-01 NOTE — Assessment & Plan Note (Signed)
Mild stenosis noted on echocardiogram. With minimal murmur on exam, I would not follow-up unless things change.

## 2016-12-17 ENCOUNTER — Other Ambulatory Visit: Payer: Self-pay | Admitting: Cardiology

## 2016-12-17 DIAGNOSIS — I4891 Unspecified atrial fibrillation: Secondary | ICD-10-CM

## 2016-12-17 DIAGNOSIS — I10 Essential (primary) hypertension: Secondary | ICD-10-CM

## 2016-12-26 ENCOUNTER — Other Ambulatory Visit: Payer: Self-pay | Admitting: Cardiology

## 2016-12-26 DIAGNOSIS — I4891 Unspecified atrial fibrillation: Secondary | ICD-10-CM

## 2016-12-26 DIAGNOSIS — I10 Essential (primary) hypertension: Secondary | ICD-10-CM

## 2017-01-09 ENCOUNTER — Encounter: Payer: Self-pay | Admitting: Nurse Practitioner

## 2017-01-09 ENCOUNTER — Ambulatory Visit (INDEPENDENT_AMBULATORY_CARE_PROVIDER_SITE_OTHER): Payer: Medicare HMO | Admitting: Nurse Practitioner

## 2017-01-09 VITALS — BP 124/80 | HR 61 | Temp 98.1°F | Resp 17 | Ht 65.0 in | Wt 95.4 lb

## 2017-01-09 DIAGNOSIS — S22080A Wedge compression fracture of T11-T12 vertebra, initial encounter for closed fracture: Secondary | ICD-10-CM | POA: Diagnosis not present

## 2017-01-09 DIAGNOSIS — M1991 Primary osteoarthritis, unspecified site: Secondary | ICD-10-CM

## 2017-01-09 DIAGNOSIS — I480 Paroxysmal atrial fibrillation: Secondary | ICD-10-CM

## 2017-01-09 DIAGNOSIS — D649 Anemia, unspecified: Secondary | ICD-10-CM | POA: Diagnosis not present

## 2017-01-09 DIAGNOSIS — Z23 Encounter for immunization: Secondary | ICD-10-CM | POA: Diagnosis not present

## 2017-01-09 DIAGNOSIS — E559 Vitamin D deficiency, unspecified: Secondary | ICD-10-CM

## 2017-01-09 DIAGNOSIS — E034 Atrophy of thyroid (acquired): Secondary | ICD-10-CM

## 2017-01-09 DIAGNOSIS — S61419A Laceration without foreign body of unspecified hand, initial encounter: Secondary | ICD-10-CM | POA: Diagnosis not present

## 2017-01-09 MED ORDER — TETANUS-DIPHTH-ACELL PERTUSSIS 5-2.5-18.5 LF-MCG/0.5 IM SUSP: 0.5000 mL | Freq: Once | INTRAMUSCULAR | 0 refills | Status: AC

## 2017-01-09 NOTE — Progress Notes (Signed)
Careteam: Patient Care Team: [name], NP as PCP - General (Geriatric Medicine) [name], NP as PCP - Internal Medicine (Geriatric Medicine) Glennie Isle, PA-C as Consulting Physician (Physician Assistant) Leonie Man, MD as Consulting Physician (Cardiology)  Advanced Directive information Does Patient Have a Medical Advance Directive?: No, Would patient like information on creating a medical advance directive?: Yes (MAU/Ambulatory/Procedural Areas - Information given)  Allergies  Allergen Reactions  . Tylenol [Acetaminophen] Other (See Comments)    Makes my body feel numb     Chief Complaint  Patient presents with  . Medical Management of Chronic Issues    Pt is being seen to establish care of chronic conditions. Pt fell on 3 days ago and injured her left hand.    . Daughter in room     HPI: Patient is a 82 y.o. female seen in the office today to establish care.  Previous pt of Dr Rex Kras. She had previously been without care for 25-30 years.  Had RMSF and lymes disease and that's when she started getting routine medical care.  Hx of   Afraid to use tylenol because of the way it made her feel, her son can also not tolerate medication   Out of her synthroid- unsure why she was not given a refill. Been out since march. Daughter just found out about 2 weeks ago.   A fib- taking metoprolol twice daily. Having palpitations frequently. When this happens she will take xra metoprolol.   Using aleve for pain in back- had a compression fracture in February.  Daughter gave her tramadol on Friday after fall.   Using MVI and vit B12  As supplement.   Vit D- 50,000 units weekly prescribed, not taking- too big. Unsure when this level was covered last.   Currently lives with her husband and her 54 year sone.  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  Has skin tear, feel 3 days ago after trying to get a Kleenex that she missed throwing into trash can.  Skin  completely dissolved. After incident cleaned with peroxide and put neosporin and wrapped it.   Would like flu shot.  Last td in the 32s   [place] earlier this year- t12 compression fracture- still doing exercises.  Back pain is better unless she stands up too long.     Review of Systems:  Review of Systems  Constitutional: Negative for chills, fever and weight loss.  HENT: Positive for congestion (and post nasal drip). Negative for tinnitus.   Respiratory: Positive for shortness of breath (with activity). Negative for cough and sputum production.   Cardiovascular: Positive for palpitations. Negative for chest pain and leg swelling.  Gastrointestinal: Positive for constipation. Negative for abdominal pain, diarrhea and heartburn.  Genitourinary: Negative for dysuria, frequency and urgency.  Musculoskeletal: Positive for back pain. Negative for falls, joint pain and myalgias.  Skin: Negative.   Neurological: Negative for dizziness and headaches.       Loss of balance  Endo/Heme/Allergies: Positive for environmental allergies. Bruises/bleeds easily.  Psychiatric/Behavioral: Negative for depression and memory loss. The patient does not have insomnia.     Past Medical History:  Diagnosis Date  . Anemia   . Atrial fibrillation, persistent ([place]) 06/2015   Admitted with TIA. Diagnosed with A. fib. Rate control. On Eliquis.  . Compression fracture of T12 vertebra ([place])   . Hypothyroid   . Lyme disease 2017  . Stroke [facility]) 06/2015   -TIA  Past Surgical History:  Procedure Laterality Date  . ABDOMINAL HYSTERECTOMY  1968  . BLADDER SUSPENSION    . TRANSTHORACIC ECHOCARDIOGRAM  06/2015   In setting of TIA.  Moderate basal hypertrophy. Vigorous function with EF 65-70%. Mild aortic stenosis with mean gradient 11 mmHg. Severe MAC with mild MR. Moderate LA dilation.   Social History:   reports that she has never smoked. She has never used smokeless tobacco. She reports that she does not  drink alcohol or use drugs.  Family History  Problem Relation Age of Onset  . Heart attack Cousin   . Cancer Mother 52       breast  . Pneumonia Father 61  . Transient ischemic attack Sister   . Atrial fibrillation Brother   . Hypertension Daughter   . Hypertension Son   . Hypertension Son     Medications: Patient's Medications  New Prescriptions   No medications on file  Previous Medications   ELIQUIS 2.5 MG TABS TABLET    TAKE 1 TABLET BY MOUTH TWICE A DAY   FERROUS GLUCONATE (FERGON) 240 (27 FE) MG TABLET    Take 240 mg by mouth daily.   LEVOTHYROXINE (SYNTHROID, LEVOTHROID) 125 MCG TABLET    Take 0.5 tablets (62.5 mcg total) by mouth daily before breakfast.   MAGNESIUM HYDROXIDE (MILK OF MAGNESIA) 400 MG/5ML SUSPENSION    Take 15 mLs by mouth daily as needed for mild constipation.   MECLIZINE (ANTIVERT) 12.5 MG TABLET    Take 1 tablet (12.5 mg total) by mouth 3 (three) times daily as needed for dizziness or nausea.   METOPROLOL TARTRATE (LOPRESSOR) 25 MG TABLET    TAKE 0.5 TABLETS (12.5 MG TOTAL) BY MOUTH 2 (TWO) TIMES DAILY.   MULTIPLE VITAMIN (MULTIVITAMIN) TABLET    Take 1 tablet by mouth daily.   NAPROXEN SODIUM (ANAPROX) 220 MG TABLET    Take 220 mg by mouth 2 (two) times daily as needed (pain).   PHENAZOPYRID-CRANBRY-C-PROBIOT (AZO URINARY TRACT SUPPORT PO)    Take 1 capsule by mouth daily as needed (uti).   TRAMADOL (ULTRAM) 50 MG TABLET    Take 50 mg by mouth 4 (four) times daily as needed.   VITAMIN B-12 (CYANOCOBALAMIN) 100 MCG TABLET    Take 100 mcg by mouth daily.   VITAMIN D, ERGOCALCIFEROL, (DRISDOL) 50000 UNITS CAPS CAPSULE    Take 50,000 Units by mouth once a week.  Modified Medications   No medications on file  Discontinued Medications   CEFPODOXIME (VANTIN) 200 MG TABLET    Take 1 tablet (200 mg total) by mouth daily.     Physical Exam:  Vitals:   01/09/17 0859  BP: 124/80  Pulse: 61  Resp: 17  Temp: 98.1 F (36.7 C)  TempSrc: Oral  SpO2: 96%    Weight: 95 lb 6.4 oz (43.3 kg)  Height: 5' 5"  (1.651 m)   Body mass index is 15.88 kg/m.  Physical Exam  Constitutional: She is oriented to person, place, and time. She appears well-developed and well-nourished. No distress.  Frail thin elderly female  HENT:  Head: Normocephalic and atraumatic.  Mouth/Throat: Oropharynx is clear and moist. No oropharyngeal exudate.  Eyes: Pupils are equal, round, and reactive to light. Conjunctivae are normal.  Neck: Normal range of motion. Neck supple.  Cardiovascular: Normal rate, regular rhythm and normal heart sounds.   Pulmonary/Chest: Effort normal and breath sounds normal.  Abdominal: Soft. Bowel sounds are normal.  Musculoskeletal: She exhibits no edema.  Neurological:  She is alert and oriented to person, place, and time.  Skin: Skin is warm and dry. She is not diaphoretic. There is erythema.  Large complete skin tear to right posterior hand under thumb to index finger to wrist, area red without drainage or heat   Psychiatric: She has a normal mood and affect.    Labs reviewed: Basic Metabolic Panel:  Recent Labs  05/20/16 1044 05/20/16 1343 05/21/16 0430 05/22/16 0423  NA 136  --  134* 134*  K 4.2  --  4.0 3.8  CL 100*  --  96* 100*  CO2 25  --  28 30  GLUCOSE 131*  --  107* 93  BUN 21*  --  26* 25*  CREATININE 0.99  --  0.97 1.05*  CALCIUM 9.6  --  9.2 8.6*  TSH  --  5.549*  --   --    Liver Function Tests:  Recent Labs  05/22/16 0423  AST 20  ALT 12*  ALKPHOS 74  BILITOT 0.6  PROT 6.1*  ALBUMIN 3.0*   No results for input(s): LIPASE, AMYLASE in the last 8760 hours. No results for input(s): AMMONIA in the last 8760 hours. CBC:  Recent Labs  05/20/16 1044 05/20/16 1425 05/21/16 0430  WBC 11.5*  --  7.9  NEUTROABS 8.9*  --   --   HGB 13.4  --  12.3  HCT 40.5 41.1 38.0  MCV 90.2  --  91.8  PLT 233  --  234   Lipid Panel: No results for input(s): CHOL, HDL, LDLCALC, TRIG, CHOLHDL, LDLDIRECT in the last  8760 hours. TSH:  Recent Labs  05/20/16 1343  TSH 5.549*   A1C: Lab Results  Component Value Date   HGBA1C 6.3 (H) 07/14/2015     Assessment/Plan .1. Vitamin D deficiency -reports vit d def and was prescribed vit d 50000 units however she is not taking. Will follow up labs today  - Vitamin D, 25-hydroxy  2. T12 compression fracture ([place]) -in March of 2018, recommended brace with neurosurgery follow up. Will clarify at next OV, pts daughter discussed possible referral to ortho in the furture due to ongoing pain. Pt reports pain controlled when she is sitting and laying down but increase in pain when she stands or walks for any length of time. Still does her exercises.  - DG Bone Density; Future  3. Hypothyroidism due to acquired atrophy of thyroid -not currently on medication, will follow up lab - TSH  4. Paroxysmal atrial fibrillation [facility]) Following with cardiology, rate controlled today, last OV 08/2016. conts on lopressor and eliquis for anticoagulation. Will recheck TSH to see if this is causing palpitations, may need cardiology follow up.   5. Anemia, unspecified type -using iron daily  - CBC with Differential/Platelets  6. Primary osteoarthritis, unspecified site -increase pain due to t12 compression fracture and OA, allergy to tylenol but has been using aleve most days. Pt with hx of renal insufficiency therefore advised against NSAID and has Rx for tramadol which has been effective without side effects noted but they use rarely. encouraged tramadol 50 mg 1/2-1 tablet to help with pain - CMP with eGFR  7. Skin tear of hand without complication, initial encounter Complete tear, duoderm applied to protect skin after area cleaned with NS. Educated not to remove duoderm but to let dressing come off on its own. To monitor for signs of infection  8. Need for immunization against influenza - Flu vaccine HIGH DOSE PF (Fluzone High  dose)  Next appt: 2 weeks for follow up  skin tear and back pain follow up   1 month for AWV and physical  Jasn Xia K. Harle Battiest  [facility] [phone] 8 am - 5 pm) [phone] (after hours)

## 2017-01-09 NOTE — Patient Instructions (Signed)
To leave duoderm on until it comes off on its own- should stay on up to a week.

## 2017-01-10 ENCOUNTER — Telehealth: Payer: Self-pay

## 2017-01-10 LAB — CBC WITH DIFFERENTIAL/PLATELET
BASOS PCT: 0.6 %
Basophils Absolute: 48 cells/uL (ref 0–200)
Eosinophils Absolute: 80 cells/uL (ref 15–500)
Eosinophils Relative: 1 %
HEMATOCRIT: 39 % (ref 35.0–45.0)
Hemoglobin: 13.3 g/dL (ref 11.7–15.5)
LYMPHS ABS: 2168 {cells}/uL (ref 850–3900)
MCH: 30.9 pg (ref 27.0–33.0)
MCHC: 34.1 g/dL (ref 32.0–36.0)
MCV: 90.5 fL (ref 80.0–100.0)
MPV: 9.7 fL (ref 7.5–12.5)
Monocytes Relative: 6.6 %
Neutro Abs: 5176 cells/uL (ref 1500–7800)
Neutrophils Relative %: 64.7 %
Platelets: 224 10*3/uL (ref 140–400)
RBC: 4.31 10*6/uL (ref 3.80–5.10)
RDW: 12.9 % (ref 11.0–15.0)
Total Lymphocyte: 27.1 %
WBC: 8 10*3/uL (ref 3.8–10.8)
WBCMIX: 528 {cells}/uL (ref 200–950)

## 2017-01-10 LAB — COMPLETE METABOLIC PANEL WITH GFR
AG RATIO: 1.3 (calc) (ref 1.0–2.5)
ALT: 16 U/L (ref 6–29)
AST: 24 U/L (ref 10–35)
Albumin: 4.5 g/dL (ref 3.6–5.1)
Alkaline phosphatase (APISO): 55 U/L (ref 33–130)
BUN/Creatinine Ratio: 19 (calc) (ref 6–22)
BUN: 19 mg/dL (ref 7–25)
CALCIUM: 9.9 mg/dL (ref 8.6–10.4)
CO2: 29 mmol/L (ref 20–32)
Chloride: 97 mmol/L — ABNORMAL LOW (ref 98–110)
Creat: 0.99 mg/dL — ABNORMAL HIGH (ref 0.60–0.88)
GFR, EST AFRICAN AMERICAN: 58 mL/min/{1.73_m2} — AB (ref >=60)
GFR, EST NON AFRICAN AMERICAN: 50 mL/min/{1.73_m2} — AB (ref >=60)
GLOBULIN: 3.4 g/dL (ref 1.9–3.7)
Glucose, Bld: 98 mg/dL (ref 65–139)
POTASSIUM: 4.7 mmol/L (ref 3.5–5.3)
SODIUM: 135 mmol/L (ref 135–146)
TOTAL PROTEIN: 7.9 g/dL (ref 6.1–8.1)
Total Bilirubin: 0.8 mg/dL (ref 0.2–1.2)

## 2017-01-10 LAB — TSH: TSH: 5.83 mIU/L — ABNORMAL HIGH (ref 0.40–4.50)

## 2017-01-10 LAB — VITAMIN D 25 HYDROXY (VIT D DEFICIENCY, FRACTURES): Vit D, 25-Hydroxy: 51 ng/mL (ref 30–100)

## 2017-01-10 NOTE — Telephone Encounter (Signed)
Medication list was updated to reflect changes made based on lab results. Pt daughter, Aurther Lofterry, was notified and agreed.

## 2017-01-16 ENCOUNTER — Ambulatory Visit (INDEPENDENT_AMBULATORY_CARE_PROVIDER_SITE_OTHER): Payer: Medicare HMO | Admitting: Nurse Practitioner

## 2017-01-16 ENCOUNTER — Encounter: Payer: Self-pay | Admitting: Nurse Practitioner

## 2017-01-16 ENCOUNTER — Inpatient Hospital Stay: Admission: RE | Admit: 2017-01-16 | Payer: Medicare HMO | Source: Ambulatory Visit

## 2017-01-16 VITALS — BP 118/72 | HR 63 | Temp 97.9°F | Resp 17 | Ht 65.0 in | Wt 96.8 lb

## 2017-01-16 DIAGNOSIS — S61419A Laceration without foreign body of unspecified hand, initial encounter: Secondary | ICD-10-CM

## 2017-01-16 NOTE — Progress Notes (Signed)
Careteam: Patient Care Team: [name], NP as PCP - General (Geriatric Medicine) [name], NP as PCP - Internal Medicine (Geriatric Medicine) Glennie Isle, PA-C as Consulting Physician (Physician Assistant) Leonie Man, MD as Consulting Physician (Cardiology)  Advanced Directive information Does Patient Have a Medical Advance Directive?: No  Allergies  Allergen Reactions  . Tylenol [Acetaminophen] Other (See Comments)    Makes my body feel numb     Chief Complaint  Patient presents with  . Acute Visit    Pt is being seen due to right hand injury oozing yellow through bandage and being crusted with dried blood.   . Other    Daughter, Karna Christmas, in room     HPI: Patient is a 83 y.o. female seen in the office today concerned over hand wound. duoderm applied due to total skin tear. Now noting drainage through the duoderm and dried blood at the bottom. Daughter notes slight odor.   Review of Systems:  Review of Systems  Constitutional: Negative for chills and fever.  Skin:       Skin tear with dressing   Past Medical History:  Diagnosis Date  . Anemia   . Atrial fibrillation, persistent ([place]) 06/2015   Admitted with TIA. Diagnosed with A. fib. Rate control. On Eliquis.  . Compression fracture of T12 vertebra ([place])   . Hypothyroid   . Lyme disease 2017  . Stroke [facility]) 06/2015   -TIA   Past Surgical History:  Procedure Laterality Date  . ABDOMINAL HYSTERECTOMY  1968  . BLADDER SUSPENSION    . TRANSTHORACIC ECHOCARDIOGRAM  06/2015   In setting of TIA.  Moderate basal hypertrophy. Vigorous function with EF 65-70%. Mild aortic stenosis with mean gradient 11 mmHg. Severe MAC with mild MR. Moderate LA dilation.   Social History:   reports that she has never smoked. She has never used smokeless tobacco. She reports that she does not drink alcohol or use drugs.  Family History  Problem Relation Age of Onset  . Heart attack Cousin   . Cancer  Mother 60       breast  . Pneumonia Father 50  . Transient ischemic attack Sister   . Atrial fibrillation Brother   . Hypertension Daughter   . Hypertension Son   . Hypertension Son     Medications: Patient's Medications  New Prescriptions   No medications on file  Previous Medications   CALCIUM CARBONATE-VITAMIN D (CALTRATE 600+D) 600-400 MG-UNIT TABLET    Take 1 tablet by mouth 2 (two) times daily.   ELIQUIS 2.5 MG TABS TABLET    TAKE 1 TABLET BY MOUTH TWICE A DAY   MAGNESIUM HYDROXIDE (MILK OF MAGNESIA) 400 MG/5ML SUSPENSION    Take 15 mLs by mouth daily as needed for mild constipation.   MECLIZINE (ANTIVERT) 12.5 MG TABLET    Take 1 tablet (12.5 mg total) by mouth 3 (three) times daily as needed for dizziness or nausea.   METOPROLOL TARTRATE (LOPRESSOR) 25 MG TABLET    TAKE 0.5 TABLETS (12.5 MG TOTAL) BY MOUTH 2 (TWO) TIMES DAILY.   MULTIPLE VITAMIN (MULTIVITAMIN) TABLET    Take 1 tablet by mouth daily.   TRAMADOL (ULTRAM) 50 MG TABLET    Take 50 mg by mouth 4 (four) times daily as needed.   VITAMIN B-12 (CYANOCOBALAMIN) 100 MCG TABLET    Take 100 mcg by mouth daily.  Modified Medications   No medications on file  Discontinued Medications  NAPROXEN SODIUM (ANAPROX) 220 MG TABLET    Take 220 mg by mouth 2 (two) times daily as needed (pain).   PHENAZOPYRID-CRANBRY-C-PROBIOT (AZO URINARY TRACT SUPPORT PO)    Take 1 capsule by mouth daily as needed (uti).     Physical Exam:  Vitals:   01/16/17 1515  BP: 118/72  Pulse: 63  Resp: 17  Temp: 97.9 F (36.6 C)  TempSrc: Oral  SpO2: 94%  Weight: 96 lb 12.8 oz (43.9 kg)  Height: _0  (1.651 m)   Body mass index is 16.11 kg/m.  Physical Exam  Constitutional: She is oriented to person, place, and time. She appears well-developed and well-nourished. No distress.  Frail thin elderly female  HENT:  Head: Normocephalic and atraumatic.  Eyes: Pupils are equal, round, and reactive to light. Conjunctivae are normal.  Neck:  Normal range of motion. Neck supple.  Musculoskeletal: She exhibits no edema.  Neurological: She is alert and oriented to person, place, and time.  Skin: Skin is warm and dry. She is not diaphoretic. No erythema.  Area has improved, now 6.5 x 2.5 cm skin tear to right posterior hand under thumb to index finger to wrist, base red without drainage or heat.  No erythema or swelling  to surrounding tissue.   Psychiatric: She has a normal mood and affect.    Labs reviewed: Basic Metabolic Panel:  Recent Labs  05/20/16 1343 05/21/16 0430 05/22/16 0423 01/09/17 1053  NA  --  134* 134* 135  K  --  4.0 3.8 4.7  CL  --  96* 100* 97*  CO2  --  _1 GLUCOSE  --  107* 93 98  BUN  --  26* 25* 19  CREATININE  --  0.97 1.05* 0.99*  CALCIUM  --  9.2 8.6* 9.9  TSH 5.549*  --   --  5.83*   Liver Function Tests:  Recent Labs  05/22/16 0423 01/09/17 1053  AST 20 24  ALT 12* 16  ALKPHOS 74  --   BILITOT 0.6 0.8  PROT 6.1* 7.9  ALBUMIN 3.0*  --    No results for input(s): LIPASE, AMYLASE in the last 8760 hours. No results for input(s): AMMONIA in the last 8760 hours. CBC:  Recent Labs  05/20/16 1044 05/20/16 1425 05/21/16 0430 01/09/17 1053  WBC 11.5*  --  7.9 8.0  NEUTROABS 8.9*  --   --  5,176  HGB 13.4  --  12.3 13.3  HCT 40.5 41.1 38.0 39.0  MCV 90.2  --  91.8 90.5  PLT 233  --  234 224   Lipid Panel: No results for input(s): CHOL, HDL, LDLCALC, TRIG, CHOLHDL, LDLDIRECT in the last 8760 hours. TSH:  Recent Labs  05/20/16 1343 01/09/17 1053  TSH 5.549* 5.83*   A1C: Lab Results  Component Value Date   HGBA1C 6.3 (H) 07/14/2015     Assessment/Plan 1. Skin tear of hand without complication, initial encounter -duoderm removed (central area already had come off of skin, scant amount of serosanguinous drainage noted). Area has improved in size.  Odor noted to dressing not to wound and wound bed red.  Xeroform dressing applied with nonstick and kurlex- to  change every other day, sooner if needed Instructed given to pt and daughter who voice understanding. To monitor for redness to surrounding tissue, drainage, swelling, or warmth Next appt: 1 week Lukasz Rogus K. Harle Battiest  [facility] [phone] 8 am - 5 pm) [phone] (  after hours)  

## 2017-01-23 ENCOUNTER — Ambulatory Visit: Payer: Medicare HMO | Admitting: Nurse Practitioner

## 2017-02-07 ENCOUNTER — Telehealth: Payer: Self-pay

## 2017-02-07 NOTE — Telephone Encounter (Signed)
-----   Message from Sharon SellerJessica K Eubanks, NP sent at 02/06/2017  8:35 AM EST ----- She missed her follow up on wound appt. As appt scheduled for 02/15/17, please just call and check to make sure wound is healing, no infection etc Can follow up sooner if needed

## 2017-02-07 NOTE — Telephone Encounter (Signed)
I spoke with patient's daughter, Camelia Engerri, who stated that patient's hand is healing well and there is no sign of infection. She stated that she would call the office if anything changes.

## 2017-02-15 ENCOUNTER — Ambulatory Visit
Admission: RE | Admit: 2017-02-15 | Discharge: 2017-02-15 | Disposition: A | Discharge status: Home / Self Care | Payer: Medicare HMO | Source: Ambulatory Visit | Attending: Nurse Practitioner | Admitting: Nurse Practitioner

## 2017-02-15 ENCOUNTER — Ambulatory Visit (INDEPENDENT_AMBULATORY_CARE_PROVIDER_SITE_OTHER): Payer: Medicare HMO

## 2017-02-15 ENCOUNTER — Encounter: Payer: Self-pay | Admitting: Nurse Practitioner

## 2017-02-15 ENCOUNTER — Ambulatory Visit (INDEPENDENT_AMBULATORY_CARE_PROVIDER_SITE_OTHER): Payer: Medicare HMO | Admitting: Nurse Practitioner

## 2017-02-15 VITALS — BP 118/64 | HR 77 | Temp 97.4°F | Ht 65.0 in | Wt 98.0 lb

## 2017-02-15 VITALS — BP 118/64 | HR 77 | Temp 97.4°F | Resp 17 | Ht 65.0 in | Wt 98.0 lb

## 2017-02-15 DIAGNOSIS — Z23 Encounter for immunization: Secondary | ICD-10-CM

## 2017-02-15 DIAGNOSIS — S0093XA Contusion of unspecified part of head, initial encounter: Secondary | ICD-10-CM

## 2017-02-15 DIAGNOSIS — W1800XA Striking against unspecified object with subsequent fall, initial encounter: Secondary | ICD-10-CM

## 2017-02-15 DIAGNOSIS — S22080A Wedge compression fracture of T11-T12 vertebra, initial encounter for closed fracture: Secondary | ICD-10-CM

## 2017-02-15 DIAGNOSIS — Z13820 Encounter for screening for osteoporosis: Secondary | ICD-10-CM

## 2017-02-15 DIAGNOSIS — R413 Other amnesia: Secondary | ICD-10-CM | POA: Diagnosis not present

## 2017-02-15 DIAGNOSIS — S61419A Laceration without foreign body of unspecified hand, initial encounter: Secondary | ICD-10-CM

## 2017-02-15 DIAGNOSIS — Z0000 Encounter for general adult medical examination without abnormal findings: Secondary | ICD-10-CM | POA: Diagnosis not present

## 2017-02-15 DIAGNOSIS — H109 Unspecified conjunctivitis: Secondary | ICD-10-CM

## 2017-02-15 DIAGNOSIS — L570 Actinic keratosis: Secondary | ICD-10-CM | POA: Diagnosis not present

## 2017-02-15 MED ORDER — POLYMYXIN B-TRIMETHOPRIM 10000-0.1 UNIT/ML-% OP SOLN: 1.0000 [drp] | Freq: Four times a day (QID) | OPHTHALMIC | 0 refills | Status: DC

## 2017-02-15 MED ORDER — TRAMADOL HCL 50 MG PO TABS: 50.0000 mg | ORAL_TABLET | Freq: Four times a day (QID) | ORAL | 0 refills | Status: DC | PRN

## 2017-02-15 NOTE — Patient Instructions (Addendum)
[facility] and x[facility]  To use tramadol as needed for pain  Recommend to follow up with dermatologist  For eyes: polytrim 1 drop into both eyes every 3 hours while awake   Cont care to right hand  PT referral has been placed.   Follow up in 6 weeks with Dr Renato Gailseed for routine follow up.

## 2017-02-15 NOTE — Addendum Note (Signed)
Addended by: Annetta MawGONTHIER, Henna Derderian E on: 02/15/2017 02:36 PM   Modules accepted: Orders

## 2017-02-15 NOTE — Patient Instructions (Signed)
[name] , Thank you for taking time to come for your Medicare Wellness Visit. I appreciate your ongoing commitment to your health goals. Please review the following plan we discussed and let me know if I can assist you in the future.   Screening recommendations/referrals: Colonoscopy excluded, you are over age 81 Mammogram excluded, you are over age 81 Bone Density due, referral put in Recommended yearly ophthalmology/optometry visit for glaucoma screening and checkup Recommended yearly dental visit for hygiene and checkup  Vaccinations: Influenza vaccine up to date. Due 2019 fall season Pneumococcal vaccine 13 given today. Tdap vaccine due, you have the prescription at home Shingles vaccine due, let us know when you are ready for the prescription  Advanced directives: Please bring us a copy of living will and health care power of attorney  Conditions/risks identified: None  Next appointment: Abbey ChattersJessica Eubanks, NP 02/15/2017 @ 1:30pm    Tyron RussellSara Saunders, RN 02/26/2018 @ 12:45pm   Preventive Care 65 Years and Older, Female Preventive care refers to lifestyle choices and visits with your health care provider that can promote health and wellness. What does preventive care include?  A yearly physical exam. This is also called an annual well check.  Dental exams once or twice a year.  Routine eye exams. Ask your health care provider how often you should have your eyes checked.  Personal lifestyle choices, including:  Daily care of your teeth and gums.  Regular physical activity.  Eating a healthy diet.  Avoiding tobacco and drug use.  Limiting alcohol use.  Practicing safe sex.  Taking low-dose aspirin every day.  Taking vitamin and mineral supplements as recommended by your health care provider. What happens during an annual well check? The services and screenings done by your health care provider during your annual well check will depend on your age, overall health,  lifestyle risk factors, and family history of disease. Counseling  Your health care provider may ask you questions about your:  Alcohol use.  Tobacco use.  Drug use.  Emotional well-being.  Home and relationship well-being.  Sexual activity.  Eating habits.  History of falls.  Memory and ability to understand (cognition).  Work and work Astronomerenvironment.  Reproductive health. Screening  You may have the following tests or measurements:  Height, weight, and BMI.  Blood pressure.  Lipid and cholesterol levels. These may be checked every 5 years, or more frequently if you are over 81 years old.  Skin check.  Lung cancer screening. You may have this screening every year starting at age 81 if you have a 30-pack-year history of smoking and currently smoke or have quit within the past 15 years.  Fecal occult blood test (FOBT) of the stool. You may have this test every year starting at age 81.  Flexible sigmoidoscopy or colonoscopy. You may have a sigmoidoscopy every 5 years or a colonoscopy every 10 years starting at age 81.  Hepatitis C blood test.  Hepatitis B blood test.  Sexually transmitted disease (STD) testing.  Diabetes screening. This is done by checking your blood sugar (glucose) after you have not eaten for a while (fasting). You may have this done every 1-3 years.  Bone density scan. This is done to screen for osteoporosis. You may have this done starting at age 81.  Mammogram. This may be done every 1-2 years. Talk to your health care provider about how often you should have regular mammograms. Talk with your health care provider about your test results, treatment options, and  if necessary, the need for more tests. Vaccines  Your health care provider may recommend certain vaccines, such as:  Influenza vaccine. This is recommended every year.  Tetanus, diphtheria, and acellular pertussis (Tdap, Td) vaccine. You may need a Td booster every 10 years.  Zoster  vaccine. You may need this after age 60.  Pneumococcal 13-valent conjugate (PCV13) vaccine. One dose is recommended after age 9.  Pneumococcal polysaccharide (PPSV23) vaccine. One dose is recommended after age 73. Talk to your health care provider about which screenings and vaccines you need and how often you need them. This information is not intended to replace advice given to you by your health care provider. Make sure you discuss any questions you have with your health care provider. Document Released: 04/03/2015 Document Revised: 11/25/2015 Document Reviewed: 01/06/2015 Elsevier Interactive Patient Education  2017 [place] Prevention in the Home Falls can cause injuries. They can happen to people of all ages. There are many things you can do to make your home safe and to help prevent falls. What can I do on the outside of my home?  Regularly fix the edges of walkways and driveways and fix any cracks.  Remove anything that might make you trip as you walk through a door, such as a raised step or threshold.  Trim any bushes or trees on the path to your home.  Use bright outdoor lighting.  Clear any walking paths of anything that might make someone trip, such as rocks or tools.  Regularly check to see if handrails are loose or broken. Make sure that both sides of any steps have handrails.  Any raised decks and porches should have guardrails on the edges.  Have any leaves, snow, or ice cleared regularly.  Use sand or salt on walking paths during winter.  Clean up any spills in your garage right away. This includes oil or grease spills. What can I do in the bathroom?  Use night lights.  Install grab bars by the toilet and in the tub and shower. Do not use towel bars as grab bars.  Use non-skid mats or decals in the tub or shower.  If you need to sit down in the shower, use a plastic, non-slip stool.  Keep the floor dry. Clean up any water that spills on the  floor as soon as it happens.  Remove soap buildup in the tub or shower regularly.  Attach bath mats securely with double-sided non-slip rug tape.  Do not have throw rugs and other things on the floor that can make you trip. What can I do in the bedroom?  Use night lights.  Make sure that you have a light by your bed that is easy to reach.  Do not use any sheets or blankets that are too big for your bed. They should not hang down onto the floor.  Have a firm chair that has side arms. You can use this for support while you get dressed.  Do not have throw rugs and other things on the floor that can make you trip. What can I do in the kitchen?  Clean up any spills right away.  Avoid walking on wet floors.  Keep items that you use a lot in easy-to-reach places.  If you need to reach something above you, use a strong step stool that has a grab bar.  Keep electrical cords out of the way.  Do not use floor polish or wax that makes floors slippery. If you  must use wax, use non-skid floor wax.  Do not have throw rugs and other things on the floor that can make you trip. What can I do with my stairs?  Do not leave any items on the stairs.  Make sure that there are handrails on both sides of the stairs and use them. Fix handrails that are broken or loose. Make sure that handrails are as long as the stairways.  Check any carpeting to make sure that it is firmly attached to the stairs. Fix any carpet that is loose or worn.  Avoid having throw rugs at the top or bottom of the stairs. If you do have throw rugs, attach them to the floor with carpet tape.  Make sure that you have a light switch at the top of the stairs and the bottom of the stairs. If you do not have them, ask someone to add them for you. What else can I do to help prevent falls?  Wear shoes that:  Do not have high heels.  Have rubber bottoms.  Are comfortable and fit you well.  Are closed at the toe. Do not wear  sandals.  If you use a stepladder:  Make sure that it is fully opened. Do not climb a closed stepladder.  Make sure that both sides of the stepladder are locked into place.  Ask someone to hold it for you, if possible.  Clearly mark and make sure that you can see:  Any grab bars or handrails.  First and last steps.  Where the edge of each step is.  Use tools that help you move around (mobility aids) if they are needed. These include:  Canes.  Walkers.  Scooters.  Crutches.  Turn on the lights when you go into a dark area. Replace any light bulbs as soon as they burn out.  Set up your furniture so you have a clear path. Avoid moving your furniture around.  If any of your floors are uneven, fix them.  If there are any pets around you, be aware of where they are.  Review your medicines with your doctor. Some medicines can make you feel dizzy. This can increase your chance of falling. Ask your doctor what other things that you can do to help prevent falls. This information is not intended to replace advice given to you by your health care provider. Make sure you discuss any questions you have with your health care provider. Document Released: 01/01/2009 Document Revised: 08/13/2015 Document Reviewed: 04/11/2014 Elsevier Interactive Patient Education  2017 [employer].

## 2017-02-15 NOTE — Progress Notes (Signed)
Subjective:   Caitlyn Henry is a 83 y.o. female who presents for an Initial Medicare Annual Wellness Visit.        Objective:    Today's Vitals   02/15/17 1310  BP: 118/64  Pulse: 77  Temp: (!) 97.4 F (36.3 C)  TempSrc: Oral  SpO2: 97%  Weight: 98 lb (44.5 kg)  Height: 5' 5"  (1.651 m)   Body mass index is 16.31 kg/m.   Current Medications (verified) Outpatient Encounter Medications as of 02/15/2017  Medication Sig  . ELIQUIS 2.5 MG TABS tablet TAKE 1 TABLET BY MOUTH TWICE A DAY  . magnesium hydroxide (MILK OF MAGNESIA) 400 MG/5ML suspension Take 15 mLs by mouth daily as needed for mild constipation.  . meclizine (ANTIVERT) 12.5 MG tablet Take 1 tablet (12.5 mg total) by mouth 3 (three) times daily as needed for dizziness or nausea.  . metoprolol tartrate (LOPRESSOR) 25 MG tablet TAKE 0.5 TABLETS (12.5 MG TOTAL) BY MOUTH 2 (TWO) TIMES DAILY.  . traMADol (ULTRAM) 50 MG tablet Take 50 mg by mouth 4 (four) times daily as needed.  . [DISCONTINUED] Calcium Carbonate-Vitamin D (CALTRATE 600+D) 600-400 MG-UNIT tablet Take 1 tablet by mouth 2 (two) times daily.  . [DISCONTINUED] Multiple Vitamin (MULTIVITAMIN) tablet Take 1 tablet by mouth daily.  . [DISCONTINUED] vitamin B-12 (CYANOCOBALAMIN) 100 MCG tablet Take 100 mcg by mouth daily.   No facility-administered encounter medications on file as of 02/15/2017.     Allergies (verified) Tylenol [acetaminophen]   History: Past Medical History:  Diagnosis Date  . Anemia   . Atrial fibrillation, persistent ([place]) 06/2015   Admitted with TIA. Diagnosed with A. fib. Rate control. On Eliquis.  . Compression fracture of T12 vertebra ([place])   . Hypothyroid   . Lyme disease 2017  . Stroke [facility]) 06/2015   -TIA   Past Surgical History:  Procedure Laterality Date  . ABDOMINAL HYSTERECTOMY  1968  . BLADDER SUSPENSION    . TRANSTHORACIC ECHOCARDIOGRAM  06/2015   In setting of TIA.  Moderate basal hypertrophy. Vigorous function  with EF 65-70%. Mild aortic stenosis with mean gradient 11 mmHg. Severe MAC with mild MR. Moderate LA dilation.   Family History  Problem Relation Age of Onset  . Heart attack Cousin   . Cancer Mother 65       breast  . Pneumonia Father 7  . Transient ischemic attack Sister   . Atrial fibrillation Brother   . Hypertension Daughter   . Hypertension Son   . Hypertension Son    Social History   Occupational History  . Not on file  Tobacco Use  . Smoking status: Never Smoker  . Smokeless tobacco: Never Used  Substance and Sexual Activity  . Alcohol use: No  . Drug use: No  . Sexual activity: No    Tobacco Counseling Counseling given: Not Answered   Activities of Daily Living In your present state of health, do you have any difficulty performing the following activities: 02/15/2017 05/20/2016  Hearing? N N  Vision? N N  Difficulty concentrating or making decisions? Tempie Donning  Walking or climbing stairs? Y Y  Dressing or bathing? Y Y  Doing errands, shopping? Tempie Donning  Preparing Food and eating ? Y -  Using the Toilet? Y -  In the past six months, have you accidently leaked urine? N -  Do you have problems with loss of bowel control? N -  Managing your Medications? N -  Managing your Finances? Y -  Housekeeping or managing your Housekeeping? Y -  Some recent data might be hidden    Immunizations and Health Maintenance Immunization History  Administered Date(s) Administered  . Influenza, High Dose Seasonal PF 01/09/2017  . Influenza-Unspecified 11/19/2015   Health Maintenance Due  Topic Date Due  . TETANUS/TDAP  08/16/1942  . DEXA SCAN  08/17/1990  . PNA vac Low Risk Adult (1 of 2 - PCV13) 08/17/1990    Patient Care Team: [name], NP as PCP - General (Geriatric Medicine) [name], NP as PCP - Internal Medicine (Geriatric Medicine) Glennie Isle, PA-C as Consulting Physician (Physician Assistant) Leonie Man, MD as Consulting Physician  (Cardiology)  Indicate any recent Medical Services you may have received from other than [facility] providers in the past year (date may be approximate).     Assessment:   This is a routine wellness examination for [name].   Hearing/Vision screen Hearing Screening Comments: Pt reports no issues   Dietary issues and exercise activities discussed: Current Exercise Habits: The patient does not participate in regular exercise at present, Exercise limited by: orthopedic condition(s)  Goals    None     Depression Screen PHQ 2/9 Scores 02/15/2017 01/09/2017  PHQ - 2 Score 0 0    Fall Risk Fall Risk  02/15/2017 01/16/2017 01/09/2017  Falls in the past year? Yes Yes Yes  Number falls in past yr: 2 or more 2 or more 2 or more  Comment - - Pt has had 5 or more falls in the last year  Injury with Fall? Yes Yes Yes  Comment - - compression fracture in back, injured left hand, brusing.     Cognitive Function: MMSE - Mini Mental State Exam 02/15/2017  Orientation to time 4  Orientation to Place 5  Registration 3  Attention/ Calculation 5  Recall 1  Language- name 2 objects 2  Language- repeat 1  Language- follow 3 step command 3  Language- read & follow direction 1  Write a sentence 1  Copy design 1  Total score 27        Screening Tests Health Maintenance  Topic Date Due  . TETANUS/TDAP  08/16/1942  . DEXA SCAN  08/17/1990  . PNA vac Low Risk Adult (1 of 2 - PCV13) 08/17/1990  . INFLUENZA VACCINE  Completed      Plan:     I have personally reviewed and addressed the Medicare Annual Wellness questionnaire and have noted the following in the patient's chart:  A. Medical and social history B. Use of alcohol, tobacco or illicit drugs  C. Current medications and supplements D. Functional ability and status E.  Nutritional status F.  Physical activity G. Advance directives H. List of other physicians I.  Hospitalizations, surgeries, and ER visits in previous 12  months J.  [place] to include hearing, vision, cognitive, depression L. Referrals and appointments - none  In addition, I have reviewed and discussed with patient certain preventive protocols, quality metrics, and best practice recommendations. A written personalized care plan for preventive services as well as general preventive health recommendations were provided to patient.  See attached scanned questionnaire for additional information.   Signed,   Rich Reining, RN Nurse Health Advisor   Quick Notes   Health Maintenance: DEXA referral sent in. PNA 13 given today. Pt has TDAP prescription at home     Abnormal Screen: MMSE 27/30. Did not pass clock drawing     Patient Concerns: Pt has  felt dizzy over the last week and a half.     Nurse Concerns: None

## 2017-02-15 NOTE — Progress Notes (Signed)
Provider: [name], NP  Patient Care Team: [name], NP as PCP - General (Geriatric Medicine) [name], NP as PCP - Internal Medicine (Geriatric Medicine) Glennie Isle, PA-C as Consulting Physician (Physician Assistant) Leonie Man, MD as Consulting Physician (Cardiology)  Extended Emergency Contact Information Primary Emergency Contact: Caitlyn Henry Address: [address]          [place], [place] [address] Caitlyn Henry of [place] Phone: [phone] Mobile Phone: [phone] Relation: Daughter Secondary Emergency Contact: Caitlyn [place] of [place] Mobile Phone: [phone] Relation: Grandaughter Allergies  Allergen Reactions  . Tylenol [Acetaminophen] Other (See Comments)    Makes my body feel numb     Goals of Care: Advanced Directive information Advanced Directives 02/15/2017  Does Patient Have a Medical Advance Directive? No  Would patient like information on creating a medical advance directive? Yes (ED - Information included in AVS)     Chief Complaint  Patient presents with  . Medical Management of Chronic Issues    Pt is being seen for a complete physical.     HPI: Patient is a 83 y.o. female seen in today for an annual wellness exam.   Missed follow up appt.  Had a fall last night and hit the stove with her head and her hip.  No headaches, blurred vision, no dizziness.  Pain in her right side- waist/hip around to the middle of her back. Affecting her gait.  Painful to walk on right side.  Could hardly walk this morning when she woke up.  2nd recent fall.  Chronic back pain due to compression fracture in T12, now with increase pain.  Has not taken anything for pain, can not take tylenol Pain is an 8/10 to back  3 falls in the last 6 months.  hospitalization in the last year due to compression fracture and acute encephalopathy due to UTI  Depression screen [facility] 2/9 02/15/2017 01/09/2017    Decreased Interest 0 0  Down, Depressed, Hopeless 0 0  PHQ - 2 Score 0 0    Fall Risk  02/15/2017 02/15/2017 01/16/2017 01/09/2017  Falls in the past year? Yes Yes Yes Yes  Number falls in past yr: 2 or more 2 or more 2 or more 2 or more  Comment - - - Pt has had 5 or more falls in the last year  Injury with Fall? Yes Yes Yes Yes  Comment - - - compression fracture in back, injured left hand, brusing.    MMSE - Mini Mental State Exam 02/15/2017  Orientation to time 4  Orientation to Place 5  Registration 3  Attention/ Calculation 5  Recall 1  Language- name 2 objects 2  Language- repeat 1  Language- follow 3 step command 3  Language- read & follow direction 1  Write a sentence 1  Copy design 1  Total score 27     Health Maintenance  Topic Date Due  . TETANUS/TDAP  08/16/1942  . DEXA SCAN  08/17/1990  . PNA vac Low Risk Adult (1 of 2 - PCV13) 08/17/1990  . INFLUENZA VACCINE  Completed     Past Medical History:  Diagnosis Date  . Anemia   . Atrial fibrillation, persistent ([place]) 06/2015   Admitted with TIA. Diagnosed with A. fib. Rate control. On Eliquis.  . Compression fracture of T12 vertebra ([place])   . Hypothyroid   . Lyme disease 2017  . Stroke [facility]) 06/2015   -TIA    Past  Surgical History:  Procedure Laterality Date  . ABDOMINAL HYSTERECTOMY  1968  . BLADDER SUSPENSION    . TRANSTHORACIC ECHOCARDIOGRAM  06/2015   In setting of TIA.  Moderate basal hypertrophy. Vigorous function with EF 65-70%. Mild aortic stenosis with mean gradient 11 mmHg. Severe MAC with mild MR. Moderate LA dilation.    Social History   Socioeconomic History  . Marital status: Married    Spouse name: None  . Number of children: None  . Years of education: None  . Highest education level: None  Social Needs  . Financial resource strain: None  . Food insecurity - worry: Never true  . Food insecurity - inability: Never true  . Transportation needs - medical: No  .  Transportation needs - non-medical: No  Occupational History  . None  Tobacco Use  . Smoking status: Never Smoker  . Smokeless tobacco: Never Used  Substance and Sexual Activity  . Alcohol use: No  . Drug use: No  . Sexual activity: No  Other Topics Concern  . None  Social History Narrative   Social History      Diet?       Do you drink/eat things with caffeine? coffee      Marital status?       married                             What year were you married? 1947      Do you live in a house, apartment, assisted living, condo, trailer, etc.? house      Is it one or more stories? Yes-but does not go upstairs      How many persons live in your home? 3      Do you have any pets in your home? (please list) 1 dog      Highest level of education completed?      Current or past profession: Clinical cytogeneticist      Do you exercise?                                      Type & how often?      Advanced Directives      Do you have a living will? no      Do you have a DNR form?     no                             If not, do you want to discuss one? yes      Do you have signed POA/HPOA for forms?       Functional Status      Do you have difficulty bathing or dressing yourself?      Do you have difficulty preparing food or eating?       Do you have difficulty managing your medications?      Do you have difficulty managing your finances?      Do you have difficulty affording your medications?    Family History  Problem Relation Age of Onset  . Heart attack Cousin   . Cancer Mother 39       breast  . Pneumonia Father 39  . Transient ischemic attack Sister   . Atrial fibrillation Brother   . Hypertension Daughter   . Hypertension  Son   . Hypertension Son     Review of Systems:  Review of Systems  Constitutional: Positive for activity change (more energy in the past few days). Negative for appetite change, fatigue and unexpected weight change.  HENT: Negative for congestion  and hearing loss.   Eyes: Positive for discharge, redness and itching. Negative for photophobia, pain and visual disturbance.  Respiratory: Negative for cough and shortness of breath.   Cardiovascular: Negative for chest pain, palpitations and leg swelling.  Gastrointestinal: Positive for constipation. Negative for abdominal pain and diarrhea.  Genitourinary: Negative for difficulty urinating and dysuria.  Musculoskeletal: Positive for back pain, gait problem and neck pain. Negative for arthralgias and myalgias.  Skin: Negative for color change.       Several areas of skin cancer that she does not follow up with dermatologist- has not been in over 6 months, was supposed to follow up but had a fall. Has had multiple areas of cancer removed.   Neurological: Negative for dizziness, tremors, syncope, facial asymmetry, weakness, light-headedness and headaches.  Psychiatric/Behavioral: Positive for confusion. Negative for agitation and behavioral problems. The patient is not nervous/anxious.     Allergies as of 02/15/2017      Reactions   Tylenol [acetaminophen] Other (See Comments)   Makes my body feel numb      Medication List        Accurate as of 02/15/17  1:45 PM. Always use your most recent med list.          ELIQUIS 2.5 MG Tabs tablet Generic drug:  apixaban TAKE 1 TABLET BY MOUTH TWICE A DAY   magnesium hydroxide 400 MG/5ML suspension Commonly known as:  MILK OF MAGNESIA Take 15 mLs by mouth daily as needed for mild constipation.   meclizine 12.5 MG tablet Commonly known as:  ANTIVERT Take 1 tablet (12.5 mg total) by mouth 3 (three) times daily as needed for dizziness or nausea.   metoprolol tartrate 25 MG tablet Commonly known as:  LOPRESSOR TAKE 0.5 TABLETS (12.5 MG TOTAL) BY MOUTH 2 (TWO) TIMES DAILY.   traMADol 50 MG tablet Commonly known as:  ULTRAM Take 50 mg by mouth 4 (four) times daily as needed.         Physical Exam: Vitals:   02/15/17 1340  BP:  118/64  Pulse: 77  Resp: 17  Temp: (!) 97.4 F (36.3 C)  TempSrc: Axillary  SpO2: 94%  Weight: 98 lb (44.5 kg)  Height: 5' 5"  (1.651 m)   Body mass index is 16.31 kg/m. Physical Exam  Constitutional: She is oriented to person, place, and time. She appears well-developed and well-nourished. No distress.  Frail thin elderly female  HENT:  Head: Normocephalic. Head is with contusion.    Right Ear: External ear normal.  Left Ear: External ear normal.  Nose: Nose normal.  Mouth/Throat: Oropharynx is clear and moist. No oropharyngeal exudate.  Tenderness   Eyes: Conjunctivae and EOM are normal. Pupils are equal, round, and reactive to light. Right eye exhibits exudate (redness with yellow green drainage). Left eye exhibits exudate (redness with yellow green drainage.).  Neck: Normal range of motion. Neck supple.  Cardiovascular: Normal rate, regular rhythm and normal heart sounds.  Pulmonary/Chest: Effort normal and breath sounds normal.  Abdominal: Soft. Bowel sounds are normal.  Musculoskeletal: Normal range of motion. She exhibits no edema.       Right hip: She exhibits normal range of motion, normal strength and no tenderness.  Left hip: She exhibits normal range of motion, normal strength and no tenderness.       Cervical back: She exhibits tenderness.       Thoracic back: She exhibits tenderness.       Lumbar back: She exhibits tenderness.  Neurological: She is alert and oriented to person, place, and time.  Skin: Skin is warm and dry. She is not diaphoretic. No erythema.  Skin tear has improved, now small circular area to right posterior hand under thumb; base red without drainage or heat.  No erythema or swelling to surrounding tissue.   Multiple large areas on AK on left lower shin Multiple small lesions to right hand, one area she reports was removed but came back.  Psychiatric: She has a normal mood and affect.    Labs reviewed: Basic Metabolic Panel: Recent  Labs    05/20/16 1343 05/21/16 0430 05/22/16 0423 01/09/17 1053  NA  --  134* 134* 135  K  --  4.0 3.8 4.7  CL  --  96* 100* 97*  CO2  --  28 30 29   GLUCOSE  --  107* 93 98  BUN  --  26* 25* 19  CREATININE  --  0.97 1.05* 0.99*  CALCIUM  --  9.2 8.6* 9.9  TSH 5.549*  --   --  5.83*   Liver Function Tests: Recent Labs    05/22/16 0423 01/09/17 1053  AST 20 24  ALT 12* 16  ALKPHOS 74  --   BILITOT 0.6 0.8  PROT 6.1* 7.9  ALBUMIN 3.0*  --    No results for input(s): LIPASE, AMYLASE in the last 8760 hours. No results for input(s): AMMONIA in the last 8760 hours. CBC: Recent Labs    05/20/16 1044 05/20/16 1425 05/21/16 0430 01/09/17 1053  WBC 11.5*  --  7.9 8.0  NEUTROABS 8.9*  --   --  5,176  HGB 13.4  --  12.3 13.3  HCT 40.5 41.1 38.0 39.0  MCV 90.2  --  91.8 90.5  PLT 233  --  234 224   Lipid Panel: No results for input(s): CHOL, HDL, LDLCALC, TRIG, CHOLHDL, LDLDIRECT in the last 8760 hours. Lab Results  Component Value Date   HGBA1C 6.3 (H) 07/14/2015    Procedures: No results found.  Assessment/Plan 1. Memory loss Progressive memory loss noted from daughter, worse today after fall.  - CT Head Wo Contrast order stat to evaluate  -MMSE 27/30  2. T12 compression fracture (HCC) Ongoing pain from this now with fall and increased pain. -xray of T spine ordered today with PT referral.   3. Fall against object Hitting her head, she is on eliquis, daughter reports slightly more confused today than baseline. STAT CT head ordered - Ambulatory referral to Home Health- due to multiple falls.  - DG Lumbar Spine Complete; Future - DG Thoracic Spine W/Swimmers; Future - DG Cervical Spine Complete; Future - traMADol (ULTRAM) 50 MG tablet; Take 1 tablet (50 mg total) by mouth every 6 (six) hours as needed.  Dispense: 30 tablet; Refill: 0  4. Contusion of head, unspecified part of head, initial encounter -increase tenderness. No headache, dizziness or blurred  vision. - CT Head Wo Contrast; Future  5. Skin tear of hand without complication Improving with xeroform dressing, will cont this  6. Actinic keratosis Multiple areas of concern. Had been following with dermatology due to removal of multiple skin cancers and skin grafts. Encouraged follow up.  7. Bacterial conjunctivitis -  trimethoprim-polymyxin b (POLYTRIM) ophthalmic solution; Place 1 drop into both eyes QID.  Dispense: 10 mL; Refill: 0  Next appt: 6 weeks with Dr Sharee Holster K. Harle Battiest  [facility] [phone] 8 am - 5 pm) [phone] (after hours)

## 2017-02-16 ENCOUNTER — Other Ambulatory Visit: Payer: Self-pay

## 2017-02-16 DIAGNOSIS — R937 Abnormal findings on diagnostic imaging of other parts of musculoskeletal system: Secondary | ICD-10-CM

## 2017-02-17 ENCOUNTER — Telehealth: Payer: Self-pay

## 2017-02-17 NOTE — Telephone Encounter (Signed)
Noted  

## 2017-02-17 NOTE — Telephone Encounter (Signed)
Home Health Care called stating that they would provide PT for patient, Just want to inform office that they are a little backed up since past holiday and that they would be able to start services with patient about mid week ( Thursday).

## 2017-02-21 DIAGNOSIS — D649 Anemia, unspecified: Secondary | ICD-10-CM | POA: Diagnosis not present

## 2017-02-21 DIAGNOSIS — E039 Hypothyroidism, unspecified: Secondary | ICD-10-CM | POA: Diagnosis not present

## 2017-02-21 DIAGNOSIS — S22080D Wedge compression fracture of T11-T12 vertebra, subsequent encounter for fracture with routine healing: Secondary | ICD-10-CM | POA: Diagnosis not present

## 2017-02-21 DIAGNOSIS — I481 Persistent atrial fibrillation: Secondary | ICD-10-CM | POA: Diagnosis not present

## 2017-02-24 ENCOUNTER — Encounter: Payer: Self-pay | Admitting: Nurse Practitioner

## 2017-02-24 ENCOUNTER — Ambulatory Visit
Admission: RE | Admit: 2017-02-24 | Discharge: 2017-02-24 | Disposition: A | Discharge status: Home / Self Care | Payer: Medicare HMO | Source: Ambulatory Visit | Attending: Nurse Practitioner | Admitting: Nurse Practitioner

## 2017-02-24 ENCOUNTER — Telehealth: Payer: Self-pay | Admitting: *Deleted

## 2017-02-24 ENCOUNTER — Ambulatory Visit (INDEPENDENT_AMBULATORY_CARE_PROVIDER_SITE_OTHER): Payer: Medicare HMO | Admitting: Nurse Practitioner

## 2017-02-24 VITALS — BP 114/70 | HR 72 | Temp 98.4°F | Resp 16 | Ht 65.0 in | Wt 95.6 lb

## 2017-02-24 DIAGNOSIS — S51812A Laceration without foreign body of left forearm, initial encounter: Secondary | ICD-10-CM | POA: Diagnosis not present

## 2017-02-24 DIAGNOSIS — I480 Paroxysmal atrial fibrillation: Secondary | ICD-10-CM

## 2017-02-24 DIAGNOSIS — R29818 Other symptoms and signs involving the nervous system: Secondary | ICD-10-CM

## 2017-02-24 DIAGNOSIS — S22080A Wedge compression fracture of T11-T12 vertebra, initial encounter for closed fracture: Secondary | ICD-10-CM | POA: Diagnosis not present

## 2017-02-24 DIAGNOSIS — W1800XA Striking against unspecified object with subsequent fall, initial encounter: Secondary | ICD-10-CM

## 2017-02-24 DIAGNOSIS — S0093XA Contusion of unspecified part of head, initial encounter: Secondary | ICD-10-CM

## 2017-02-24 DIAGNOSIS — Z7189 Other specified counseling: Secondary | ICD-10-CM

## 2017-02-24 DIAGNOSIS — R7989 Other specified abnormal findings of blood chemistry: Secondary | ICD-10-CM | POA: Diagnosis not present

## 2017-02-24 LAB — COMPLETE METABOLIC PANEL WITH GFR
AG RATIO: 1.1 (calc) (ref 1.0–2.5)
ALBUMIN MSPROF: 4 g/dL (ref 3.6–5.1)
ALT: 11 U/L (ref 6–29)
AST: 20 U/L (ref 10–35)
Alkaline phosphatase (APISO): 64 U/L (ref 33–130)
BILIRUBIN TOTAL: 0.6 mg/dL (ref 0.2–1.2)
BUN / CREAT RATIO: 31 (calc) — AB (ref 6–22)
BUN: 28 mg/dL — ABNORMAL HIGH (ref 7–25)
CHLORIDE: 101 mmol/L (ref 98–110)
CO2: 27 mmol/L (ref 20–32)
Calcium: 9.5 mg/dL (ref 8.6–10.4)
Creat: 0.91 mg/dL — ABNORMAL HIGH (ref 0.60–0.88)
GFR, EST AFRICAN AMERICAN: 64 mL/min/{1.73_m2} (ref >=60)
GFR, Est Non African American: 55 mL/min/{1.73_m2} — ABNORMAL LOW (ref >=60)
GLOBULIN: 3.6 g/dL (ref 1.9–3.7)
Glucose, Bld: 97 mg/dL (ref 65–99)
POTASSIUM: 4.4 mmol/L (ref 3.5–5.3)
SODIUM: 139 mmol/L (ref 135–146)
TOTAL PROTEIN: 7.6 g/dL (ref 6.1–8.1)

## 2017-02-24 LAB — CBC WITH DIFFERENTIAL/PLATELET
BASOS ABS: 61 {cells}/uL (ref 0–200)
Basophils Relative: 0.7 %
EOS PCT: 0.8 %
Eosinophils Absolute: 70 cells/uL (ref 15–500)
HEMATOCRIT: 36.3 % (ref 35.0–45.0)
Hemoglobin: 12.2 g/dL (ref 11.7–15.5)
LYMPHS ABS: 2540 {cells}/uL (ref 850–3900)
MCH: 30.8 pg (ref 27.0–33.0)
MCHC: 33.6 g/dL (ref 32.0–36.0)
MCV: 91.7 fL (ref 80.0–100.0)
MONOS PCT: 6.2 %
MPV: 9 fL (ref 7.5–12.5)
NEUTROS PCT: 63.1 %
Neutro Abs: 5490 cells/uL (ref 1500–7800)
Platelets: 271 10*3/uL (ref 140–400)
RBC: 3.96 10*6/uL (ref 3.80–5.10)
RDW: 13 % (ref 11.0–15.0)
Total Lymphocyte: 29.2 %
WBC mixed population: 539 cells/uL (ref 200–950)
WBC: 8.7 10*3/uL (ref 3.8–10.8)

## 2017-02-24 LAB — TSH: TSH: 6.63 mIU/L — ABNORMAL HIGH (ref 0.40–4.50)

## 2017-02-24 NOTE — Patient Instructions (Addendum)
Can use Tramadol 1/2 tablet as needed for pain  NSAIDs (aleve, motrin, ibuprofen, advil) will increase risk of bleeding.   Try salonpas over the counter patch or aspercream with lidocaine to back for pain.   SW and nurse consult added to home health.

## 2017-02-24 NOTE — Telephone Encounter (Signed)
Shanda BumpsJessica called and wanted me to check her in basket concerning CT results for patient.   Per Jessica-Call patient daughter, Camelia Engerri and let her know CT results were ok, no bleed.   Camelia Engerri, daughter notified and agreed.

## 2017-02-24 NOTE — Progress Notes (Signed)
Careteam: Patient Care Team: Caitlyn Chandler, NP as PCP - General (Geriatric Medicine) Caitlyn Chandler, NP as PCP - Internal Medicine (Geriatric Medicine) Caitlyn Isle, PA-C as Consulting Physician (Physician Assistant) Caitlyn Man, MD as Consulting Physician (Cardiology)  Advanced Directive information    Allergies  Allergen Reactions  . Tylenol [Acetaminophen] Other (See Comments)    Makes my body feel numb     Chief Complaint  Patient presents with  . Acute Visit    Pt is being seen due to a fall 4 days ago where she scraped left forearm badly and hit the back of her head. Pt states that she was standing still and just fell for no known reason.    HPI: Patient is a 83 y.o. female seen in the office today due to fall. Daughter here with pt today states she feel on Monday and hit her head again.  Pt reports she was standing talking to her husband and she just went down.  Skin tear noted on left forearm. Reports no LOC, no dizzness, no lightheadeness.  Reports she hit her head on the stove and bottom on the floor.  After she feel was having a headache but it did not last long.  Increase in fatigue and using walker more, gait has been off.  More pain in the back and waistline- no increase since the first fall.  Injury noted to left forearm but unsure what she hit it on.  Her husband is sick and almost had a few falls as well.  Her son lives with her but works. daughters help but they can not be there 24/7.  Daughter reports she was doubling up on her heart medication- not cutting metoprolol in half. Pharmacy changed manufactures, the other pills with much larger.  She is working with PT. Reports it is going good but has only been going out once.  Request home health nursing due to   Daughter feels like she needs to use WC but can not get WC into bedroom  Review of Systems:  Review of Systems  Constitutional: Negative for chills, fever and weight loss.    HENT: Negative for congestion and tinnitus.   Respiratory: Positive for shortness of breath (with activity). Negative for cough and sputum production.   Cardiovascular: Negative for chest pain, palpitations and leg swelling.  Gastrointestinal: Negative for abdominal pain, diarrhea and heartburn.  Genitourinary: Negative for dysuria, frequency and urgency.  Musculoskeletal: Positive for back pain and falls (multiple falls). Negative for joint pain and myalgias.  Skin:       Multiple skin tears  Neurological: Negative for dizziness and headaches.       Loss of balance  Endo/Heme/Allergies: Positive for environmental allergies. Bruises/bleeds easily.  Psychiatric/Behavioral: Negative for depression and memory loss. The patient is nervous/anxious. The patient does not have insomnia.     Past Medical History:  Diagnosis Date  . Anemia   . Atrial fibrillation, persistent ([place]) 06/2015   Admitted with TIA. Diagnosed with A. fib. Rate control. On Eliquis.  . Compression fracture of T12 vertebra ([place])   . Hypothyroid   . Lyme disease 2017  . Stroke [facility]) 06/2015   -TIA   Past Surgical History:  Procedure Laterality Date  . ABDOMINAL HYSTERECTOMY  1968  . BLADDER SUSPENSION    . TRANSTHORACIC ECHOCARDIOGRAM  06/2015   In setting of TIA.  Moderate basal hypertrophy. Vigorous function with EF 65-70%. Mild aortic stenosis with mean gradient 11 mmHg.  Severe MAC with mild MR. Moderate LA dilation.   Social History:   reports that  has never smoked. she has never used smokeless tobacco. She reports that she does not drink alcohol or use drugs.  Family History  Problem Relation Age of Onset  . Heart attack Cousin   . Cancer Mother 73       breast  . Pneumonia Father 44  . Transient ischemic attack Sister   . Atrial fibrillation Brother   . Hypertension Daughter   . Hypertension Son   . Hypertension Son     Medications:   Medication List        Accurate as of 02/24/17  9:09 AM.  Always use your most recent med list.          ELIQUIS 2.5 MG Tabs tablet Generic drug:  apixaban TAKE 1 TABLET BY MOUTH TWICE A DAY   magnesium hydroxide 400 MG/5ML suspension Commonly known as:  MILK OF MAGNESIA   meclizine 12.5 MG tablet Commonly known as:  ANTIVERT Take 1 tablet (12.5 mg total) by mouth 3 (three) times daily as needed for dizziness or nausea.   metoprolol tartrate 25 MG tablet Commonly known as:  LOPRESSOR TAKE 0.5 TABLETS (12.5 MG TOTAL) BY MOUTH 2 (TWO) TIMES DAILY.   traMADol 50 MG tablet Commonly known as:  ULTRAM Take 1 tablet (50 mg total) by mouth every 6 (six) hours as needed.   trimethoprim-polymyxin b ophthalmic solution Commonly known as:  POLYTRIM Place 1 drop into both eyes QID.        Physical Exam:  Vitals:   02/24/17 0901  BP: 114/70  Pulse: 72  Resp: 16  Temp: 98.4 F (36.9 C)  TempSrc: Oral  SpO2: 96%  Weight: 95 lb 9.6 oz (43.4 kg)  Height: 5' 5" (1.651 m)   Body mass index is 15.91 kg/m.  Physical Exam  Constitutional: She is oriented to person, place, and time. She appears well-developed and well-nourished. No distress.  Frail thin elderly female  HENT:  Head: Normocephalic. Head is with contusion (with tenderness).    Right Ear: External ear normal.  Left Ear: External ear normal.  Nose: Nose normal.  Mouth/Throat: Oropharynx is clear and moist. No oropharyngeal exudate.  Eyes: Conjunctivae and EOM are normal. Pupils are equal, round, and reactive to light.  Neck: Normal range of motion. Neck supple.  Cardiovascular: Normal rate, regular rhythm and normal heart sounds.  Pulmonary/Chest: Effort normal and breath sounds normal.  Abdominal: Soft. Bowel sounds are normal.  Musculoskeletal: Normal range of motion. She exhibits no edema.       Right hip: She exhibits normal range of motion, normal strength and no tenderness.       Left hip: She exhibits normal range of motion, normal strength and no tenderness.        Cervical back: She exhibits tenderness.       Thoracic back: She exhibits tenderness.       Lumbar back: She exhibits tenderness.  Neurological: She is alert and oriented to person, place, and time.  Skin: Skin is warm and dry. She is not diaphoretic. No erythema.  Skin tear on posterior forearm 10 cm x 1.5 cm. Skin flap not intact.   Psychiatric: She has a normal mood and affect.    Labs reviewed: Basic Metabolic Panel: Recent Labs    05/20/16 1343 05/21/16 0430 05/22/16 0423 01/09/17 1053  NA  --  134* 134* 135  K  --  4.0  3.8 4.7  CL  --  96* 100* 97*  CO2  --  _0 GLUCOSE  --  107* 93 98  BUN  --  26* 25* 19  CREATININE  --  0.97 1.05* 0.99*  CALCIUM  --  9.2 8.6* 9.9  TSH 5.549*  --   --  5.83*   Liver Function Tests: Recent Labs    05/22/16 0423 01/09/17 1053  AST 20 24  ALT 12* 16  ALKPHOS 74  --   BILITOT 0.6 0.8  PROT 6.1* 7.9  ALBUMIN 3.0*  --    No results for input(s): LIPASE, AMYLASE in the last 8760 hours. No results for input(s): AMMONIA in the last 8760 hours. CBC: Recent Labs    05/20/16 1044 05/20/16 1425 05/21/16 0430 01/09/17 1053  WBC 11.5*  --  7.9 8.0  NEUTROABS 8.9*  --   --  5,176  HGB 13.4  --  12.3 13.3  HCT 40.5 41.1 38.0 39.0  MCV 90.2  --  91.8 90.5  PLT 233  --  234 224   Lipid Panel: No results for input(s): CHOL, HDL, LDLCALC, TRIG, CHOLHDL, LDLDIRECT in the last 8760 hours. TSH: Recent Labs    05/20/16 1343 01/09/17 1053  TSH 5.549* 5.83*   A1C: Lab Results  Component Value Date   HGBA1C 6.3 (H) 07/14/2015     Assessment/Plan 1. T12 compression fracture (HCC) Increased pain. Reports tramadol causing vertigo so she does not want to take.  Can not tolerate tylenol due to side effects. NSAIDS not advised due to eliquis.  May try salon pas or aspercream with lidocaine for back pain. MRI has been scheduled for further evaluation  2. Fall against object -another fall- 2 falls in less than 1 week with  head contusion. Discussed with daughter and pt about having increase help at home which they are agreeable to. Pt and husband do not want to leave home.  -home health PT/OT already involved.  -home health nursing and SW consulted to help with wound care (nurse) and community resources (SW) - CMP with eGFR - CBC with Differential/Platelets  3. Contusion of head, unspecified part of head, initial encounter - CT Head Wo Contrast; Future due to hitting head with headache and being on blood thinner.  - CMP with eGFR - CBC with Differential/Platelets  4. Skin tear of left forearm without complication, initial encounter -dressed with xeroform and gauze. To cont routine dressing changes. Home health nursing consulted at this time.  5. Other symptoms and signs involving the nervous system -s/p fall and hitting her head on oven. Headache noted for several days after injury that has improved.  - CT Head Wo Contrast; Future  6. Paroxysmal atrial fibrillation (HCC) -SR right now and rate controlled, currently on eliquis. Discussed in details about risk of bleeding due to falls with head injury. Pt and daughter aware of risk of bleeding into brain with taking eliquis and recurrent falls. Pt wants to continue to take eliquis despite the risk and falls.  Pt reports her fear of having a stroke due to a blood clot was greater than having a significant bleed due to fall. Daughter states she plans to bring this information back to her family. Due to recurrent falls recommend stopping eliquis.   7. Advance care planning - 18 mins discussing advanced care planning, there are a lot of family issues.  - DNR (Do Not Resuscitate)  8. Elevated TSH TSH slightly elevated in August  will follow up due to multiple falls.  - TSH  Next appt: 1 month with Dr Sharee Holster K. Harle Battiest  [facility] [phone] 8 am - 5 pm) [phone] (after hours)

## 2017-02-27 ENCOUNTER — Telehealth: Payer: Self-pay | Admitting: *Deleted

## 2017-02-27 NOTE — Telephone Encounter (Signed)
Thank you :)

## 2017-02-27 NOTE — Telephone Encounter (Signed)
Received fax from [employer] stating MRI Thoracic Spine without contrast is unable to be approved. Needs additional information to support an approval by 03/02/17.  Member #: [id number] Ref#: [id number]  I called [employer] [phone] Option 4 and spoke with Chase CallerJo N. Clinical information given that was needed (X-Rays already done).  MRI Thoracic Spine without Contrast APPROVED 02/27/17-05/28/17.

## 2017-02-28 ENCOUNTER — Inpatient Hospital Stay: Admission: RE | Admit: 2017-02-28 | Payer: Medicare HMO | Source: Ambulatory Visit

## 2017-02-28 ENCOUNTER — Other Ambulatory Visit: Payer: Medicare HMO

## 2017-03-07 ENCOUNTER — Other Ambulatory Visit: Payer: Self-pay | Admitting: Nurse Practitioner

## 2017-03-07 ENCOUNTER — Telehealth (HOSPITAL_COMMUNITY): Payer: Self-pay

## 2017-03-07 DIAGNOSIS — S22000A Wedge compression fracture of unspecified thoracic vertebra, initial encounter for closed fracture: Secondary | ICD-10-CM

## 2017-03-07 NOTE — Telephone Encounter (Signed)
Called to schedule consult for compression fracture, left message for pt to return call. AW 

## 2017-03-09 ENCOUNTER — Emergency Department (HOSPITAL_COMMUNITY): Payer: Medicare HMO

## 2017-03-09 ENCOUNTER — Other Ambulatory Visit (HOSPITAL_COMMUNITY): Payer: Self-pay | Admitting: Interventional Radiology

## 2017-03-09 ENCOUNTER — Encounter (HOSPITAL_COMMUNITY): Payer: Self-pay | Admitting: Pharmacy Technician

## 2017-03-09 ENCOUNTER — Emergency Department (HOSPITAL_COMMUNITY)
Admission: EM | Admit: 2017-03-09 | Discharge: 2017-03-09 | Disposition: A | Discharge status: Home / Self Care | Payer: Medicare HMO | Attending: Emergency Medicine | Admitting: Emergency Medicine

## 2017-03-09 DIAGNOSIS — Z7901 Long term (current) use of anticoagulants: Secondary | ICD-10-CM | POA: Diagnosis not present

## 2017-03-09 DIAGNOSIS — Y92009 Unspecified place in unspecified non-institutional (private) residence as the place of occurrence of the external cause: Secondary | ICD-10-CM | POA: Diagnosis not present

## 2017-03-09 DIAGNOSIS — S299XXA Unspecified injury of thorax, initial encounter: Secondary | ICD-10-CM | POA: Diagnosis present

## 2017-03-09 DIAGNOSIS — E039 Hypothyroidism, unspecified: Secondary | ICD-10-CM | POA: Diagnosis not present

## 2017-03-09 DIAGNOSIS — R55 Syncope and collapse: Secondary | ICD-10-CM | POA: Diagnosis not present

## 2017-03-09 DIAGNOSIS — R937 Abnormal findings on diagnostic imaging of other parts of musculoskeletal system: Secondary | ICD-10-CM

## 2017-03-09 DIAGNOSIS — I10 Essential (primary) hypertension: Secondary | ICD-10-CM | POA: Insufficient documentation

## 2017-03-09 DIAGNOSIS — M546 Pain in thoracic spine: Secondary | ICD-10-CM

## 2017-03-09 DIAGNOSIS — Y939 Activity, unspecified: Secondary | ICD-10-CM | POA: Diagnosis not present

## 2017-03-09 DIAGNOSIS — S32030A Wedge compression fracture of third lumbar vertebra, initial encounter for closed fracture: Secondary | ICD-10-CM

## 2017-03-09 DIAGNOSIS — Z79899 Other long term (current) drug therapy: Secondary | ICD-10-CM | POA: Diagnosis not present

## 2017-03-09 DIAGNOSIS — I4891 Unspecified atrial fibrillation: Secondary | ICD-10-CM | POA: Insufficient documentation

## 2017-03-09 DIAGNOSIS — S22000A Wedge compression fracture of unspecified thoracic vertebra, initial encounter for closed fracture: Secondary | ICD-10-CM | POA: Insufficient documentation

## 2017-03-09 DIAGNOSIS — W19XXXA Unspecified fall, initial encounter: Secondary | ICD-10-CM | POA: Diagnosis not present

## 2017-03-09 DIAGNOSIS — Y999 Unspecified external cause status: Secondary | ICD-10-CM | POA: Diagnosis not present

## 2017-03-09 DIAGNOSIS — S22080A Wedge compression fracture of T11-T12 vertebra, initial encounter for closed fracture: Secondary | ICD-10-CM

## 2017-03-09 LAB — CBG MONITORING, ED: GLUCOSE-CAPILLARY: 119 mg/dL — AB (ref 65–99)

## 2017-03-09 LAB — URINALYSIS, ROUTINE W REFLEX MICROSCOPIC
BILIRUBIN URINE: NEGATIVE
Glucose, UA: NEGATIVE mg/dL
Hgb urine dipstick: NEGATIVE
Ketones, ur: NEGATIVE mg/dL
Nitrite: NEGATIVE
Protein, ur: NEGATIVE mg/dL
SPECIFIC GRAVITY, URINE: 1.013 (ref 1.005–1.030)
WBC UA: NONE SEEN WBC/hpf (ref 0–5)
pH: 6 (ref 5.0–8.0)

## 2017-03-09 LAB — COMPREHENSIVE METABOLIC PANEL
ALBUMIN: 3.8 g/dL (ref 3.5–5.0)
ALT: 12 U/L — ABNORMAL LOW (ref 14–54)
AST: 23 U/L (ref 15–41)
Alkaline Phosphatase: 123 U/L (ref 38–126)
Anion gap: 6 (ref 5–15)
BILIRUBIN TOTAL: 0.6 mg/dL (ref 0.3–1.2)
BUN: 27 mg/dL — AB (ref 6–20)
CO2: 28 mmol/L (ref 22–32)
Calcium: 9.3 mg/dL (ref 8.9–10.3)
Chloride: 99 mmol/L — ABNORMAL LOW (ref 101–111)
Creatinine, Ser: 1.06 mg/dL — ABNORMAL HIGH (ref 0.44–1.00)
GFR calc Af Amer: 52 mL/min — ABNORMAL LOW (ref >60)
GFR calc non Af Amer: 44 mL/min — ABNORMAL LOW (ref >60)
GLUCOSE: 122 mg/dL — AB (ref 65–99)
POTASSIUM: 4.7 mmol/L (ref 3.5–5.1)
SODIUM: 133 mmol/L — AB (ref 135–145)
TOTAL PROTEIN: 7.8 g/dL (ref 6.5–8.1)

## 2017-03-09 LAB — CBC
HEMATOCRIT: 41.5 % (ref 36.0–46.0)
HEMOGLOBIN: 13.8 g/dL (ref 12.0–15.0)
MCH: 31 pg (ref 26.0–34.0)
MCHC: 33.3 g/dL (ref 30.0–36.0)
MCV: 93.3 fL (ref 78.0–100.0)
Platelets: 280 10*3/uL (ref 150–400)
RBC: 4.45 MIL/uL (ref 3.87–5.11)
RDW: 13.7 % (ref 11.5–15.5)
WBC: 8.1 10*3/uL (ref 4.0–10.5)

## 2017-03-09 MED ORDER — MORPHINE SULFATE (PF) 4 MG/ML IV SOLN
2.0000 mg | Freq: Once | INTRAVENOUS | Status: AC
  Administered 2017-03-09: 2 mg via INTRAVENOUS
  Filled 2017-03-09: qty 1

## 2017-03-09 MED ORDER — OXYCODONE HCL 5 MG PO TABS: 2.5000 mg | ORAL_TABLET | ORAL | 0 refills | Status: DC | PRN

## 2017-03-09 MED ORDER — SODIUM CHLORIDE 0.9 % IV BOLUS (SEPSIS)
500.0000 mL | Freq: Once | INTRAVENOUS | Status: AC
  Administered 2017-03-09: 500 mL via INTRAVENOUS

## 2017-03-09 NOTE — ED Notes (Signed)
Got patient undress on the monitor did ekg shown to Dr Patria Maneampos patient is resting with family at bedside and call bell in reach

## 2017-03-09 NOTE — Progress Notes (Signed)
CSW spoke with pt and pt's daughter at bedside. Pt expressed concerns about light house work and meals at home. Pt's daughter expressed concerns about pt's safety at home. Pt has multiple family members that provide care at different times during the day. Pt stated that her granddaughter's friend works at an [employer] and is willing to help her and her husband at home. CSW was informed by nursing staff that pt will be going to [facility] in likely a week.   CSW will consult with CM about pt to discuss discharge plans.   Montine CircleKelsy Tomeka Kantner, Silverio LayLCSWA [facility]  [phone]

## 2017-03-09 NOTE — ED Notes (Signed)
Pt remains in MRI 

## 2017-03-09 NOTE — ED Notes (Signed)
Pt placed on BP

## 2017-03-09 NOTE — Progress Notes (Signed)
Patient ID: Hildred AlaminVirginia Yanda, female   DOB: 06/09/1923, 83 y.o.   MRN: [id number]  Pt in ED with Back pain  IMPRESSION: MR THORACIC SPINE IMPRESSION Subacute on chronic benign-appearing compression fracture of T12 with minimal protrusion of bone into the spinal canal without neural impingement. MR LUMBAR SPINE IMPRESSION Subacute benign appearing compression fracture involving the superior and inferior endplates of L3. No protrusion of bone or disc material into the spinal canal.  Request for KP per ED MD Dr Corliss Skainseveshwar reviewed imaging and approves procedure  Carollee SiresInterviewed pt and daughter in room Discussed Kyphoplasty procedure with them at length Understand procedure benefits and risks  They are aware Insurance will need approval prior to procedure Will need Off Eliquis 2 days before procedure  Plan per ED MD  I will give scheduler info in am Will start Insurance approval status We will call pt with time and date of procedure asap Continue Eliquis til she hears from us.  She and daughter have good understanding of plan

## 2017-03-09 NOTE — ED Notes (Signed)
Patient transported to MRI 

## 2017-03-09 NOTE — ED Provider Notes (Signed)
[facility] EMERGENCY DEPARTMENT Provider Note   CSN: [id number] Arrival date & time: 03/09/17  1121     History   Chief Complaint Chief Complaint  Patient presents with  . Near Syncope     HPI Caitlyn Henry is a 83 y.o. female.  HPI 83 year old female presents the emergency department with her daughter today in regards to increasing generalized weakness and mobility issues since an initial fall resulting in thoracic compression fracture in February 2018.  Which she is been managed conservatively without much success.  She has an upcoming MRI scheduled and a evaluation by interventional radiology on January 4 scheduled.  Today's episode occurred when she was encouraged to walk from her bedroom to the breakfast table and while arriving at the breakfast table she had a syncopal episode.  No preceding chest pain or palpitations.  No chest pain or shortness of breath at this time.  She reports that she felt lightheaded prior to this.  She has a history of atrial fibrillation on chronic anticoagulation.  No headache.  No new injury from the episode today.   Past Medical History:  Diagnosis Date  . Anemia   . Atrial fibrillation, persistent ([place]) 06/2015   Admitted with TIA. Diagnosed with A. fib. Rate control. On Eliquis.  . Compression fracture of T12 vertebra ([place])   . Hypothyroid   . Lyme disease 2017  . Stroke [facility]) 06/2015   -TIA    Patient Active Problem List   Diagnosis Date Noted  . Altered mental status   . Acute encephalopathy 05/20/2016  . UTI (urinary tract infection) 05/20/2016  . Compression fracture of body of thoracic vertebra ([place]) 05/20/2016  . Aortic stenosis, mild 11/20/2015  . Chronic anticoagulation 20-Dec-202017  . Dizziness, nonspecific 07/16/2015  . Cephalalgia   . TIA due to embolism ([place])   . Hypothyroidism 07/15/2015  . Acute left-sided weakness   . Encounter for anticoagulation discussion and counseling   . Stroke ([place])  07/14/2015  . Left-sided weakness 07/14/2015  . Persistent atrial fibrillation (H[facility]) -- CHA2DS2-VASc Score 6, on Low Dose Eliquis 07/14/2015  . Headache 07/14/2015  . Essential hypertension 07/14/2015  . Positive Lyme disease serology 07/14/2015  . Anemia     Past Surgical History:  Procedure Laterality Date  . ABDOMINAL HYSTERECTOMY  1968  . BLADDER SUSPENSION    . TRANSTHORACIC ECHOCARDIOGRAM  06/2015   In setting of TIA.  Moderate basal hypertrophy. Vigorous function with EF 65-70%. Mild aortic stenosis with mean gradient 11 mmHg. Severe MAC with mild MR. Moderate LA dilation.    OB History    No data available       Home Medications    Prior to Admission medications   Medication Sig Start Date End Date Taking? Authorizing Provider  ELIQUIS 2.5 MG TABS tablet TAKE 1 TABLET BY MOUTH TWICE A DAY 12/19/16  Yes Leonie Man, MD  magnesium hydroxide (MILK OF MAGNESIA) 400 MG/5ML suspension Take 15 mLs by mouth daily as needed for mild constipation.   Yes [provider]  meclizine (ANTIVERT) 12.5 MG tablet Take 1 tablet (12.5 mg total) by mouth 3 (three) times daily as needed for dizziness or nausea. 05/23/16  Yes Donne Hazel, MD  metoprolol tartrate (LOPRESSOR) 25 MG tablet TAKE 0.5 TABLETS (12.5 MG TOTAL) BY MOUTH 2 (TWO) TIMES DAILY. 12/26/16  Yes Leonie Man, MD  traMADol (ULTRAM) 50 MG tablet Take 1 tablet (50 mg total) by mouth every 6 (six) hours as  needed. 02/15/17  Yes [name], NP  trimethoprim-polymyxin b (POLYTRIM) ophthalmic solution Place 1 drop into both eyes QID. 02/15/17  Yes Eubanks, Carlos American, NP  oxyCODONE (ROXICODONE) 5 MG immediate release tablet Take 0.5 tablets (2.5 mg total) by mouth every 4 (four) hours as needed for severe pain. 03/09/17   Jola Schmidt, MD    Family History Family History  Problem Relation Age of Onset  . Heart attack Cousin   . Cancer Mother 74       breast  . Pneumonia Father 95  . Transient ischemic  attack Sister   . Atrial fibrillation Brother   . Hypertension Daughter   . Hypertension Son   . Hypertension Son     Social History Social History   Tobacco Use  . Smoking status: Never Smoker  . Smokeless tobacco: Never Used  Substance Use Topics  . Alcohol use: No  . Drug use: No     Allergies   Tylenol [acetaminophen]   Review of Systems Review of Systems  All other systems reviewed and are negative.    Physical Exam Updated Vital Signs BP 139/90   Pulse 99   Temp 98.5 F (36.9 C) (Oral)   Resp 17   Ht 5' 5"  (1.651 m)   Wt 45.4 kg (100 lb)   SpO2 98%   BMI 16.64 kg/m   Physical Exam  Constitutional: She is oriented to person, place, and time. She appears well-developed and well-nourished. No distress.  HENT:  Head: Normocephalic and atraumatic.  Eyes: EOM are normal.  Neck: Normal range of motion.  Cardiovascular: Normal rate and regular rhythm.  Pulmonary/Chest: Effort normal and breath sounds normal.  Abdominal: Soft. She exhibits no distension. There is no tenderness.  Musculoskeletal: Normal range of motion.  Mild thoracic and lumbar tenderness without significant spasm  Neurological: She is alert and oriented to person, place, and time.  Skin: Skin is warm and dry.  Psychiatric: She has a normal mood and affect. Judgment normal.  Nursing note and vitals reviewed.    ED Treatments / Results  Labs (all labs ordered are listed, but only abnormal results are displayed) Labs Reviewed  COMPREHENSIVE METABOLIC PANEL - Abnormal; Notable for the following components:      Result Value   Sodium 133 (*)    Chloride 99 (*)    Glucose, Bld 122 (*)    BUN 27 (*)    Creatinine, Ser 1.06 (*)    ALT 12 (*)    GFR calc non Af Amer 44 (*)    GFR calc Af Amer 52 (*)    All other components within normal limits  URINALYSIS, ROUTINE W REFLEX MICROSCOPIC - Abnormal; Notable for the following components:   Color, Urine STRAW (*)    APPearance CLOUDY (*)      Leukocytes, UA TRACE (*)    Bacteria, UA RARE (*)    Squamous Epithelial / LPF 0-5 (*)    Non Squamous Epithelial 0-5 (*)    All other components within normal limits  CBG MONITORING, ED - Abnormal; Notable for the following components:   Glucose-Capillary 119 (*)    All other components within normal limits  CBC    EKG  EKG Interpretation  Date/Time:  Thursday March 09 2017 11:32:41 EST Ventricular Rate:  98 PR Interval:    QRS Duration: 96 QT Interval:  363 QTC Calculation: 464 R Axis:   13 Text Interpretation:  Atrial fibrillation Anterior infarct, old Baseline wander  in lead(s) V1 No significant change was found Confirmed by Jola Schmidt [id number]) on 03/09/2017 12:49:36 PM       Radiology Mr Thoracic Spine Wo Contrast  Result Date: 03/09/2017 CLINICAL DATA:  Severe back pain after several recent falls. T12 compression fracture in February 2018. EXAM: MRI THORACIC AND LUMBAR SPINE WITHOUT CONTRAST TECHNIQUE: Multiplanar and multiecho pulse sequences of the thoracic and lumbar spine were obtained without intravenous contrast. COMPARISON:  Radiographs dated 02/15/2017 and CT scan dated 07/15/2015 FINDINGS: MRI THORACIC SPINE FINDINGS Alignment: Alignment is normal except for slight protrusion of the posterior margin of the T12 vertebral body into the spinal canal due to benign-appearing compression fracture, increased in severity since 05/20/2016. Vertebrae: Benign-appearing compression fracture of T12, increased in severity since 05/20/2016. The posterosuperior margin of T12 protrudes approximately 4 mm into the spinal canal. No neural impingement. 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 There is focal edema or a cyst in the left transverse process of T4. This is not felt to be significant. There is also a compression fracture of the L3 vertebral body. CT lumbar MRI report for further details. Cord:  Normal. Paraspinal and other soft tissues: There is a 2.1 cm cavitary lesion in  the posterior aspect of the right upper lobe. I suspect this represents a a postinflammatory process. The lesion was demonstrated on the prior CT scan of 07/15/2015 and appears more cystic and thin walled. Disc levels: T1-2 through the T10-11: No disc bulging or protrusion of significance. No foraminal or spinal stenosis. No facet arthritis. T11-12: No disc protrusion. Old compression fracture of T12, increased in severely since 05/20/2016. There is slight edema in the T12 vertebral body suggesting subacute on chronic fractures. Minimal protrusion of the posterosuperior aspect of the vertebra into the spinal canal without neural impingement. T12-L1:  Normal disc. MRI LUMBAR SPINE FINDINGS Segmentation:  Standard. Alignment:  Physiologic. Vertebrae: Benign-appearing mild compression fracture of the inferior and superior endplates of L3. No protrusion of bone into the spinal canal. Conus medullaris and cauda equina: Conus extends to the L1 level. Conus and cauda equina appear normal. Paraspinal and other soft tissues: Slight paraspinal edema around the fracture of L3. Disc levels: L1-2:  Normal. L2-3: No disc or bone protrusion. Minimal disc bulging into both neural foramina. Mild compression fracture of the superior endplate of L3. Slight hypertrophy of the ligamentum flavum without neural impingement. No foraminal stenosis. L3-4: Small disc bulges to the right and left of midline with tiny protrusions into both neural foramina without neural impingement. Slight hypertrophy of the ligamentum flavum and facet joints. L4-5: Minimal disc bulges into the neural foramina bilaterally without neural impingement. Minimal degenerative changes of the facet joints. L5-S1: Slight disc space narrowing. No significant disc bulging. No protrusion. Tarlov cysts at S2 and S3. IMPRESSION: MR THORACIC SPINE IMPRESSION Subacute on chronic benign-appearing compression fracture of T12 with minimal protrusion of bone into the spinal canal  without neural impingement. MR LUMBAR SPINE IMPRESSION Subacute benign appearing compression fracture involving the superior and inferior endplates of L3. No protrusion of bone or disc material into the spinal canal. Electronically Signed   By: Lorriane Shire M.D.   On: 03/09/2017 15:05   Mr Lumbar Spine Wo Contrast  Result Date: 03/09/2017 CLINICAL DATA:  Severe back pain after several recent falls. T12 compression fracture in February 2018. EXAM: MRI THORACIC AND LUMBAR SPINE WITHOUT CONTRAST TECHNIQUE: Multiplanar and multiecho pulse sequences of the thoracic and lumbar spine were obtained without intravenous contrast. COMPARISON:  Radiographs  dated 02/15/2017 and CT scan dated 07/15/2015 FINDINGS: MRI THORACIC SPINE FINDINGS Alignment: Alignment is normal except for slight protrusion of the posterior margin of the T12 vertebral body into the spinal canal due to benign-appearing compression fracture, increased in severity since 05/20/2016. Vertebrae: Benign-appearing compression fracture of T12, increased in severity since 05/20/2016. The posterosuperior margin of T12 protrudes approximately 4 mm into the spinal canal. No neural impingement. 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 There is focal edema or a cyst in the left transverse process of T4. This is not felt to be significant. There is also a compression fracture of the L3 vertebral body. CT lumbar MRI report for further details. Cord:  Normal. Paraspinal and other soft tissues: There is a 2.1 cm cavitary lesion in the posterior aspect of the right upper lobe. I suspect this represents a a postinflammatory process. The lesion was demonstrated on the prior CT scan of 07/15/2015 and appears more cystic and thin walled. Disc levels: T1-2 through the T10-11: No disc bulging or protrusion of significance. No foraminal or spinal stenosis. No facet arthritis. T11-12: No disc protrusion. Old compression fracture of T12, increased in severely since 05/20/2016.  There is slight edema in the T12 vertebral body suggesting subacute on chronic fractures. Minimal protrusion of the posterosuperior aspect of the vertebra into the spinal canal without neural impingement. T12-L1:  Normal disc. MRI LUMBAR SPINE FINDINGS Segmentation:  Standard. Alignment:  Physiologic. Vertebrae: Benign-appearing mild compression fracture of the inferior and superior endplates of L3. No protrusion of bone into the spinal canal. Conus medullaris and cauda equina: Conus extends to the L1 level. Conus and cauda equina appear normal. Paraspinal and other soft tissues: Slight paraspinal edema around the fracture of L3. Disc levels: L1-2:  Normal. L2-3: No disc or bone protrusion. Minimal disc bulging into both neural foramina. Mild compression fracture of the superior endplate of L3. Slight hypertrophy of the ligamentum flavum without neural impingement. No foraminal stenosis. L3-4: Small disc bulges to the right and left of midline with tiny protrusions into both neural foramina without neural impingement. Slight hypertrophy of the ligamentum flavum and facet joints. L4-5: Minimal disc bulges into the neural foramina bilaterally without neural impingement. Minimal degenerative changes of the facet joints. L5-S1: Slight disc space narrowing. No significant disc bulging. No protrusion. Tarlov cysts at S2 and S3. IMPRESSION: MR THORACIC SPINE IMPRESSION Subacute on chronic benign-appearing compression fracture of T12 with minimal protrusion of bone into the spinal canal without neural impingement. MR LUMBAR SPINE IMPRESSION Subacute benign appearing compression fracture involving the superior and inferior endplates of L3. No protrusion of bone or disc material into the spinal canal. Electronically Signed   By: Lorriane Shire M.D.   On: 03/09/2017 15:05    Procedures Procedures (including critical care time)  Medications Ordered in ED Medications  sodium chloride 0.9 % bolus 500 mL (0 mLs Intravenous  Stopped 03/09/17 1542)  morphine 4 MG/ML injection 2 mg (2 mg Intravenous Given 03/09/17 1310)     Initial Impression / Assessment and Plan / ED Course  I have reviewed the triage vital signs and the nursing notes.  Pertinent labs & imaging results that were available during my care of the patient were reviewed by me and considered in my medical decision making (see chart for details).     I was able discussed the case with interventional radiology and they have seen and evaluated the patient in the emergency department.  The patient be scheduled for outpatient vertebroplasty.  Much  of her issues are secondary to the compression fractures which have been limiting her mobility at home.  She is becoming deconditioned at home.  At suspect that is what happened with her near syncopal episode today.  Doubt abnormal heart rhythm.  Overall well-appearing now.  Discharged home with 2.5 mg of oxycodone for pain control.  Case management involved for additional home health resources.  No indication for admission to the hospital  Final Clinical Impressions(s) / ED Diagnoses   Final diagnoses:  Closed compression fracture of thoracic vertebra, initial encounter ([place])  Closed compression fracture of third lumbar vertebra, initial encounter [facility])  Syncope, unspecified syncope type    ED Discharge Orders        [facility]     03/09/17 1641    Face-to-face encounter (required for Medicare/Medicaid patients)    Comments:  [facility] certify that this patient is under my care and that I, or a nurse practitioner or physician's assistant working with me, had a face-to-face encounter that meets the physician face-to-face encounter requirements with this patient on 03/09/2017. The encounter with the patient was in whole, or in part for the following medical condition(s) which is the primary reason for home health care (List medical condition): lumbar and thoracic compression fractures    03/09/17 1641    oxyCODONE (ROXICODONE) 5 MG immediate release tablet  Every 4 hours PRN     03/09/17 1656       Jola Schmidt, MD 03/09/17 1737

## 2017-03-09 NOTE — ED Notes (Signed)
ED Provider at bedside. 

## 2017-03-09 NOTE — ED Notes (Signed)
Case manager at the bedside.  

## 2017-03-09 NOTE — ED Triage Notes (Signed)
Pt arrives via ems from home with brief period of unresponsiveness lasting approx . Pt was lying supine on recliner upon ems arrival. Pt A&OX4 with EMS.  cbg 139, 98.4 temp, 166/100 BP, 97% RA. HR 80-120 afib. pt on eliquis, hx of tia this year. Neg stroke exam. 20g RAC.

## 2017-03-10 ENCOUNTER — Other Ambulatory Visit (HOSPITAL_COMMUNITY): Payer: Self-pay | Admitting: Interventional Radiology

## 2017-03-10 DIAGNOSIS — S22080A Wedge compression fracture of T11-T12 vertebra, initial encounter for closed fracture: Secondary | ICD-10-CM

## 2017-03-10 DIAGNOSIS — S32030A Wedge compression fracture of third lumbar vertebra, initial encounter for closed fracture: Secondary | ICD-10-CM

## 2017-03-10 DIAGNOSIS — M546 Pain in thoracic spine: Secondary | ICD-10-CM

## 2017-03-11 ENCOUNTER — Other Ambulatory Visit: Payer: Medicare HMO

## 2017-03-22 ENCOUNTER — Other Ambulatory Visit: Payer: Self-pay | Admitting: Radiology

## 2017-03-23 ENCOUNTER — Telehealth (HOSPITAL_COMMUNITY): Payer: Self-pay

## 2017-03-23 ENCOUNTER — Other Ambulatory Visit (HOSPITAL_COMMUNITY): Payer: Self-pay | Admitting: Interventional Radiology

## 2017-03-23 ENCOUNTER — Ambulatory Visit (HOSPITAL_COMMUNITY)
Admission: RE | Admit: 2017-03-23 | Discharge: 2017-03-23 | Disposition: A | Discharge status: Home / Self Care | Payer: Medicare HMO | Source: Ambulatory Visit | Attending: Interventional Radiology | Admitting: Interventional Radiology

## 2017-03-23 DIAGNOSIS — I481 Persistent atrial fibrillation: Secondary | ICD-10-CM | POA: Insufficient documentation

## 2017-03-23 DIAGNOSIS — M546 Pain in thoracic spine: Secondary | ICD-10-CM

## 2017-03-23 DIAGNOSIS — Z7901 Long term (current) use of anticoagulants: Secondary | ICD-10-CM | POA: Insufficient documentation

## 2017-03-23 DIAGNOSIS — S22080A Wedge compression fracture of T11-T12 vertebra, initial encounter for closed fracture: Secondary | ICD-10-CM | POA: Insufficient documentation

## 2017-03-23 DIAGNOSIS — Z79891 Long term (current) use of opiate analgesic: Secondary | ICD-10-CM | POA: Diagnosis not present

## 2017-03-23 DIAGNOSIS — Z8673 Personal history of transient ischemic attack (TIA), and cerebral infarction without residual deficits: Secondary | ICD-10-CM | POA: Insufficient documentation

## 2017-03-23 DIAGNOSIS — S32030A Wedge compression fracture of third lumbar vertebra, initial encounter for closed fracture: Secondary | ICD-10-CM

## 2017-03-23 DIAGNOSIS — E039 Hypothyroidism, unspecified: Secondary | ICD-10-CM | POA: Insufficient documentation

## 2017-03-23 DIAGNOSIS — Z79899 Other long term (current) drug therapy: Secondary | ICD-10-CM | POA: Diagnosis not present

## 2017-03-23 DIAGNOSIS — W19XXXA Unspecified fall, initial encounter: Secondary | ICD-10-CM | POA: Insufficient documentation

## 2017-03-23 HISTORY — PX: IR VERTEBROPLASTY EA ADDL (T&LS) BX INC UNI/BIL INC INJECT/IMAGING: IMG5517

## 2017-03-23 HISTORY — PX: IR VERTEBROPLASTY CERV/THOR BX INC UNI/BIL INC/INJECT/IMAGING: IMG5515

## 2017-03-23 LAB — CBC
HEMATOCRIT: 39.8 % (ref 36.0–46.0)
HEMOGLOBIN: 13.1 g/dL (ref 12.0–15.0)
MCH: 31.3 pg (ref 26.0–34.0)
MCHC: 32.9 g/dL (ref 30.0–36.0)
MCV: 95 fL (ref 78.0–100.0)
Platelets: 235 10*3/uL (ref 150–400)
RBC: 4.19 MIL/uL (ref 3.87–5.11)
RDW: 14.2 % (ref 11.5–15.5)
WBC: 6.9 10*3/uL (ref 4.0–10.5)

## 2017-03-23 LAB — BASIC METABOLIC PANEL
ANION GAP: 7 (ref 5–15)
BUN: 22 mg/dL — AB (ref 6–20)
CO2: 27 mmol/L (ref 22–32)
Calcium: 9.5 mg/dL (ref 8.9–10.3)
Chloride: 102 mmol/L (ref 101–111)
Creatinine, Ser: 1.07 mg/dL — ABNORMAL HIGH (ref 0.44–1.00)
GFR calc Af Amer: 51 mL/min — ABNORMAL LOW (ref >60)
GFR, EST NON AFRICAN AMERICAN: 44 mL/min — AB (ref >60)
Glucose, Bld: 107 mg/dL — ABNORMAL HIGH (ref 65–99)
POTASSIUM: 4 mmol/L (ref 3.5–5.1)
SODIUM: 136 mmol/L (ref 135–145)

## 2017-03-23 LAB — PROTIME-INR
INR: 1
Prothrombin Time: 13.1 seconds (ref 11.4–15.2)

## 2017-03-23 MED ORDER — HYDROMORPHONE HCL 1 MG/ML IJ SOLN
INTRAMUSCULAR | Status: AC
  Filled 2017-03-23: qty 1

## 2017-03-23 MED ORDER — BUPIVACAINE HCL (PF) 0.5 % IJ SOLN
INTRAMUSCULAR | Status: AC
  Filled 2017-03-23: qty 30

## 2017-03-23 MED ORDER — CEFAZOLIN SODIUM-DEXTROSE 2-4 GM/100ML-% IV SOLN
2.0000 g | INTRAVENOUS | Status: AC
  Administered 2017-03-23: 2 g via INTRAVENOUS

## 2017-03-23 MED ORDER — FENTANYL CITRATE (PF) 100 MCG/2ML IJ SOLN
INTRAMUSCULAR | Status: AC | PRN
  Administered 2017-03-23: 25 ug via INTRAVENOUS

## 2017-03-23 MED ORDER — IOPAMIDOL (ISOVUE-300) INJECTION 61%
INTRAVENOUS | Status: AC
  Administered 2017-03-23: 5 mL
  Filled 2017-03-23: qty 50

## 2017-03-23 MED ORDER — HYDRALAZINE HCL 20 MG/ML IJ SOLN
INTRAMUSCULAR | Status: AC
  Filled 2017-03-23: qty 1

## 2017-03-23 MED ORDER — CEFAZOLIN SODIUM-DEXTROSE 2-4 GM/100ML-% IV SOLN
INTRAVENOUS | Status: AC
  Administered 2017-03-23: 2 g via INTRAVENOUS
  Filled 2017-03-23: qty 100

## 2017-03-23 MED ORDER — FENTANYL CITRATE (PF) 100 MCG/2ML IJ SOLN
INTRAMUSCULAR | Status: AC
  Filled 2017-03-23: qty 2

## 2017-03-23 MED ORDER — SODIUM CHLORIDE 0.9 % IV SOLN
INTRAVENOUS | Status: DC
  Administered 2017-03-23: 10:00:00 via INTRAVENOUS

## 2017-03-23 MED ORDER — METOPROLOL TARTRATE 12.5 MG HALF TABLET
12.5000 mg | ORAL_TABLET | Freq: Once | ORAL | Status: AC
  Administered 2017-03-23: 12.5 mg via ORAL

## 2017-03-23 MED ORDER — METOPROLOL TARTRATE 12.5 MG HALF TABLET
ORAL_TABLET | ORAL | Status: AC
  Filled 2017-03-23: qty 1

## 2017-03-23 MED ORDER — BUPIVACAINE HCL (PF) 0.5 % IJ SOLN
INTRAMUSCULAR | Status: AC | PRN
  Administered 2017-03-23: 15 mL

## 2017-03-23 MED ORDER — TOBRAMYCIN SULFATE 1.2 G IJ SOLR
INTRAMUSCULAR | Status: AC
  Filled 2017-03-23: qty 1.2

## 2017-03-23 MED ORDER — MIDAZOLAM HCL 2 MG/2ML IJ SOLN
INTRAMUSCULAR | Status: AC
  Filled 2017-03-23: qty 2

## 2017-03-23 MED ORDER — MIDAZOLAM HCL 2 MG/2ML IJ SOLN
INTRAMUSCULAR | Status: AC | PRN
  Administered 2017-03-23: 1 mg via INTRAVENOUS

## 2017-03-23 MED ORDER — SODIUM CHLORIDE 0.9 % IV SOLN
INTRAVENOUS | Status: AC | PRN
  Administered 2017-03-23: 10 mL/h via INTRAVENOUS

## 2017-03-23 MED ORDER — SODIUM CHLORIDE 0.9 % IV SOLN: INTRAVENOUS | Status: DC

## 2017-03-23 MED ORDER — GELATIN ABSORBABLE 12-7 MM EX MISC
CUTANEOUS | Status: AC
  Filled 2017-03-23: qty 1

## 2017-03-23 NOTE — H&P (Signed)
Chief Complaint: Patient was seen in consultation today for kyphoplasty at the request of Sherrie Mustache, NP  Referring Physician(s): Sherrie Mustache, NP  Supervising Physician: Luanne Bras  Patient Status: [facility] - Out-pt  History of Present Illness: Caitlyn Henry is a 83 y.o. female who fell recently and developed back pain. Her workup imaging has found acute fractures at the T12 and L3 levels. She continues to have pain and was consulted to have kyphoplasty procedure. She was briefly seen during her last visit to the ER and has now been scheduled for procedure. Still reports pain with movement or prolonged standing. Feels better when resting or laying flat. Denies recent illness, fevers, chills. PMHx, meds, labs, imaging, allergies reviewed. Has held her Eliquis for the past 2 days as instructed. Did NOT take her metoprolol this am Has been NPO  Past Medical History:  Diagnosis Date  . Anemia   . Atrial fibrillation, persistent ([place]) 06/2015   Admitted with TIA. Diagnosed with A. fib. Rate control. On Eliquis.  . Compression fracture of T12 vertebra ([place])   . Hypothyroid   . Lyme disease 2017  . Stroke [facility]) 06/2015   -TIA    Past Surgical History:  Procedure Laterality Date  . ABDOMINAL HYSTERECTOMY  1968  . BLADDER SUSPENSION    . TRANSTHORACIC ECHOCARDIOGRAM  06/2015   In setting of TIA.  Moderate basal hypertrophy. Vigorous function with EF 65-70%. Mild aortic stenosis with mean gradient 11 mmHg. Severe MAC with mild MR. Moderate LA dilation.    Allergies: Tylenol [acetaminophen]  Medications: Prior to Admission medications   Medication Sig Start Date End Date Taking? Authorizing Provider  magnesium hydroxide (MILK OF MAGNESIA) 400 MG/5ML suspension Take 15 mLs by mouth daily as needed for mild constipation.   Yes [provider]  metoprolol tartrate (LOPRESSOR) 25 MG tablet TAKE 0.5 TABLETS (12.5 MG TOTAL) BY MOUTH 2 (TWO) TIMES DAILY.  12/26/16  Yes Leonie Man, MD  trimethoprim-polymyxin b [facility]) ophthalmic solution Place 1 drop into both eyes QID. 02/15/17  Yes [name], NP  ELIQUIS 2.5 MG TABS tablet TAKE 1 TABLET BY MOUTH TWICE A DAY 12/19/16   Leonie Man, MD  meclizine (ANTIVERT) 12.5 MG tablet Take 1 tablet (12.5 mg total) by mouth 3 (three) times daily as needed for dizziness or nausea. 05/23/16   Donne Hazel, MD  oxyCODONE (ROXICODONE) 5 MG immediate release tablet Take 0.5 tablets (2.5 mg total) by mouth every 4 (four) hours as needed for severe pain. 03/09/17   Jola Schmidt, MD  traMADol (ULTRAM) 50 MG tablet Take 1 tablet (50 mg total) by mouth every 6 (six) hours as needed. 02/15/17   [name], NP     Family History  Problem Relation Age of Onset  . Heart attack Cousin   . Cancer Mother 78       breast  . Pneumonia Father 15  . Transient ischemic attack Sister   . Atrial fibrillation Brother   . Hypertension Daughter   . Hypertension Son   . Hypertension Son     Social History   Socioeconomic History  . Marital status: Married    Spouse name: Not on file  . Number of children: Not on file  . Years of education: Not on file  . Highest education level: Not on file  Social Needs  . Financial resource strain: Not on file  . Food insecurity - worry: Never true  . Food insecurity - inability:  Never true  . Transportation needs - medical: No  . Transportation needs - non-medical: No  Occupational History  . Not on file  Tobacco Use  . Smoking status: Never Smoker  . Smokeless tobacco: Never Used  Substance and Sexual Activity  . Alcohol use: No  . Drug use: No  . Sexual activity: No  Other Topics Concern  . Not on file  Social History Narrative   Social History      Diet?       Do you drink/eat things with caffeine? coffee      Marital status?       married                             What year were you married? 1947      Do you live in a house,  apartment, assisted living, condo, trailer, etc.? house      Is it one or more stories? Yes-but does not go upstairs      How many persons live in your home? 3      Do you have any pets in your home? (please list) 1 dog      Highest level of education completed?      Current or past profession: Clinical cytogeneticist      Do you exercise?                                      Type & how often?      Advanced Directives      Do you have a living will? no      Do you have a DNR form?     no                             If not, do you want to discuss one? yes      Do you have signed POA/HPOA for forms?       Functional Status      Do you have difficulty bathing or dressing yourself?      Do you have difficulty preparing food or eating?       Do you have difficulty managing your medications?      Do you have difficulty managing your finances?      Do you have difficulty affording your medications?     Review of Systems: A 12 point ROS discussed and pertinent positives are indicated in the HPI above.  All other systems are negative.  Review of Systems  Vital Signs: BP (!) 138/101   Pulse (!) 102   Temp (!) 97.2 F (36.2 C) (Oral)   Resp 16   Ht _0  (1.651 m)   Wt 95 lb (43.1 kg)   SpO2 100%   BMI 15.81 kg/m   Physical Exam  Constitutional: She is oriented to person, place, and time. She appears well-developed. No distress.  HENT:  Head: Normocephalic.  Mouth/Throat: Oropharynx is clear and moist.  Neck: Normal range of motion. No JVD present. No tracheal deviation present.  Cardiovascular: Normal heart sounds.  Irreg irreg  Pulmonary/Chest: Effort normal and breath sounds normal. No respiratory distress.  Neurological: She is alert and oriented to person, place, and time.  Skin: Skin is warm and dry.  Psychiatric: She has a normal mood  and affect. Judgment normal.     Imaging: Ct Head Wo Contrast  Result Date: 02/24/2017 CLINICAL DATA:  Multiple falls EXAM: CT  HEAD WITHOUT CONTRAST TECHNIQUE: Contiguous axial images were obtained from the base of the skull through the vertex without intravenous contrast. COMPARISON:  02/15/2017 FINDINGS: Brain: No evidence of acute infarction, hemorrhage, extra-axial collection or mass lesion/mass effect. Mild cortical and central atrophy.  No ventriculomegaly. Subcortical white matter and periventricular small vessel ischemic changes. Vascular: Intracranial atherosclerosis. Skull: Normal. Negative for fracture or focal lesion. Sinuses/Orbits: The visualized paranasal sinuses are essentially clear. The mastoid air cells are unopacified. Other: None. IMPRESSION: No evidence of acute intracranial abnormality. Atrophy with small vessel ischemic changes. Electronically Signed   By: Julian Hy M.D.   On: 02/24/2017 14:24   Mr Thoracic Spine Wo Contrast  Result Date: 03/09/2017 CLINICAL DATA:  Severe back pain after several recent falls. T12 compression fracture in February 2018. EXAM: MRI THORACIC AND LUMBAR SPINE WITHOUT CONTRAST TECHNIQUE: Multiplanar and multiecho pulse sequences of the thoracic and lumbar spine were obtained without intravenous contrast. COMPARISON:  Radiographs dated 02/15/2017 and CT scan dated 07/15/2015 FINDINGS: MRI THORACIC SPINE FINDINGS Alignment: Alignment is normal except for slight protrusion of the posterior margin of the T12 vertebral body into the spinal canal due to benign-appearing compression fracture, increased in severity since 05/20/2016. Vertebrae: Benign-appearing compression fracture of T12, increased in severity since 05/20/2016. The posterosuperior margin of T12 protrudes approximately 4 mm into the spinal canal. No neural impingement. 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 There is focal edema or a cyst in the left transverse process of T4. This is not felt to be significant. There is also a compression fracture of the L3 vertebral body. CT lumbar MRI report for further details. Cord:   Normal. Paraspinal and other soft tissues: There is a 2.1 cm cavitary lesion in the posterior aspect of the right upper lobe. I suspect this represents a a postinflammatory process. The lesion was demonstrated on the prior CT scan of 07/15/2015 and appears more cystic and thin walled. Disc levels: T1-2 through the T10-11: No disc bulging or protrusion of significance. No foraminal or spinal stenosis. No facet arthritis. T11-12: No disc protrusion. Old compression fracture of T12, increased in severely since 05/20/2016. There is slight edema in the T12 vertebral body suggesting subacute on chronic fractures. Minimal protrusion of the posterosuperior aspect of the vertebra into the spinal canal without neural impingement. T12-L1:  Normal disc. MRI LUMBAR SPINE FINDINGS Segmentation:  Standard. Alignment:  Physiologic. Vertebrae: Benign-appearing mild compression fracture of the inferior and superior endplates of L3. No protrusion of bone into the spinal canal. Conus medullaris and cauda equina: Conus extends to the L1 level. Conus and cauda equina appear normal. Paraspinal and other soft tissues: Slight paraspinal edema around the fracture of L3. Disc levels: L1-2:  Normal. L2-3: No disc or bone protrusion. Minimal disc bulging into both neural foramina. Mild compression fracture of the superior endplate of L3. Slight hypertrophy of the ligamentum flavum without neural impingement. No foraminal stenosis. L3-4: Small disc bulges to the right and left of midline with tiny protrusions into both neural foramina without neural impingement. Slight hypertrophy of the ligamentum flavum and facet joints. L4-5: Minimal disc bulges into the neural foramina bilaterally without neural impingement. Minimal degenerative changes of the facet joints. L5-S1: Slight disc space narrowing. No significant disc bulging. No protrusion. Tarlov cysts at S2 and S3. IMPRESSION: MR THORACIC SPINE IMPRESSION Subacute on chronic benign-appearing  compression  fracture of T12 with minimal protrusion of bone into the spinal canal without neural impingement. MR LUMBAR SPINE IMPRESSION Subacute benign appearing compression fracture involving the superior and inferior endplates of L3. No protrusion of bone or disc material into the spinal canal. Electronically Signed   By: Lorriane Shire M.D.   On: 03/09/2017 15:05   Mr Lumbar Spine Wo Contrast  Result Date: 03/09/2017 CLINICAL DATA:  Severe back pain after several recent falls. T12 compression fracture in February 2018. EXAM: MRI THORACIC AND LUMBAR SPINE WITHOUT CONTRAST TECHNIQUE: Multiplanar and multiecho pulse sequences of the thoracic and lumbar spine were obtained without intravenous contrast. COMPARISON:  Radiographs dated 02/15/2017 and CT scan dated 07/15/2015 FINDINGS: MRI THORACIC SPINE FINDINGS Alignment: Alignment is normal except for slight protrusion of the posterior margin of the T12 vertebral body into the spinal canal due to benign-appearing compression fracture, increased in severity since 05/20/2016. Vertebrae: Benign-appearing compression fracture of T12, increased in severity since 05/20/2016. The posterosuperior margin of T12 protrudes approximately 4 mm into the spinal canal. No neural impingement. 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 There is focal edema or a cyst in the left transverse process of T4. This is not felt to be significant. There is also a compression fracture of the L3 vertebral body. CT lumbar MRI report for further details. Cord:  Normal. Paraspinal and other soft tissues: There is a 2.1 cm cavitary lesion in the posterior aspect of the right upper lobe. I suspect this represents a a postinflammatory process. The lesion was demonstrated on the prior CT scan of 07/15/2015 and appears more cystic and thin walled. Disc levels: T1-2 through the T10-11: No disc bulging or protrusion of significance. No foraminal or spinal stenosis. No facet arthritis. T11-12: No disc  protrusion. Old compression fracture of T12, increased in severely since 05/20/2016. There is slight edema in the T12 vertebral body suggesting subacute on chronic fractures. Minimal protrusion of the posterosuperior aspect of the vertebra into the spinal canal without neural impingement. T12-L1:  Normal disc. MRI LUMBAR SPINE FINDINGS Segmentation:  Standard. Alignment:  Physiologic. Vertebrae: Benign-appearing mild compression fracture of the inferior and superior endplates of L3. No protrusion of bone into the spinal canal. Conus medullaris and cauda equina: Conus extends to the L1 level. Conus and cauda equina appear normal. Paraspinal and other soft tissues: Slight paraspinal edema around the fracture of L3. Disc levels: L1-2:  Normal. L2-3: No disc or bone protrusion. Minimal disc bulging into both neural foramina. Mild compression fracture of the superior endplate of L3. Slight hypertrophy of the ligamentum flavum without neural impingement. No foraminal stenosis. L3-4: Small disc bulges to the right and left of midline with tiny protrusions into both neural foramina without neural impingement. Slight hypertrophy of the ligamentum flavum and facet joints. L4-5: Minimal disc bulges into the neural foramina bilaterally without neural impingement. Minimal degenerative changes of the facet joints. L5-S1: Slight disc space narrowing. No significant disc bulging. No protrusion. Tarlov cysts at S2 and S3. IMPRESSION: MR THORACIC SPINE IMPRESSION Subacute on chronic benign-appearing compression fracture of T12 with minimal protrusion of bone into the spinal canal without neural impingement. MR LUMBAR SPINE IMPRESSION Subacute benign appearing compression fracture involving the superior and inferior endplates of L3. No protrusion of bone or disc material into the spinal canal. Electronically Signed   By: Lorriane Shire M.D.   On: 03/09/2017 15:05    Labs:  CBC: Recent Labs    01/09/17 1053 02/24/17 1020  03/09/17 1204 03/23/17 1017  WBC 8.0 8.7 8.1 6.9  HGB 13.3 12.2 13.8 13.1  HCT 39.0 36.3 41.5 39.8  PLT 224 271 280 235    COAGS: Recent Labs    03/23/17 1017  INR 1.00    BMP: Recent Labs    01/09/17 1053 02/24/17 1020 03/09/17 1204 03/23/17 1017  NA 135 139 133* 136  K 4.7 4.4 4.7 4.0  CL 97* 101 99* 102  CO2 _0 GLUCOSE 98 97 122* 107*  BUN 19 28* 27* 22*  CALCIUM 9.9 9.5 9.3 9.5  CREATININE 0.99* 0.91* 1.06* 1.07*  GFRNONAA 50* 55* 44* 44*  GFRAA 58* 64 52* 51*    LIVER FUNCTION TESTS: Recent Labs    05/22/16 0423 01/09/17 1053 02/24/17 1020 03/09/17 1204  BILITOT 0.6 0.8 0.6 0.6  AST _1 ALT 12* 16 11 12*  ALKPHOS 74  --   --  123  PROT 6.1* 7.9 7.6 7.8  ALBUMIN 3.0*  --   --  3.8    TUMOR MARKERS: No results for input(s): AFPTM, CEA, CA199, CHROMGRNA in the last 8760 hours.  Assessment and Plan: Acute/subacute T12 and L3 compression fractures Plan for kyphoplasty today Labs ok Risks and benefits of KP were discussed with the patient including, but not limited to education regarding the natural healing process of compression fractures without intervention, bleeding, infection, cement migration which may cause spinal cord damage, paralysis, pulmonary embolism or even death.  This interventional procedure involves the use of X-rays and because of the nature of the planned procedure, it is possible that we will have prolonged use of X-ray fluoroscopy.  Potential radiation risks to you include (but are not limited to) the following: - A slightly elevated risk for cancer  several years later in life. This risk is typically less than 0.5% percent. This risk is low in comparison to the normal incidence of human cancer, which is 33% for women and 50% for men according to the [employer]. - Radiation induced injury can include skin redness, resembling a rash, tissue breakdown / ulcers and hair loss (which can be temporary or  permanent).   The likelihood of either of these occurring depends on the difficulty of the procedure and whether you are sensitive to radiation due to previous procedures, disease, or genetic conditions.   IF your procedure requires a prolonged use of radiation, you will be notified and given written instructions for further action.  It is your responsibility to monitor the irradiated area for the 2 weeks following the procedure and to notify your physician if you are concerned that you have suffered a radiation induced injury.    All of the patient's questions were answered, patient is agreeable to proceed.  Consent signed and in chart.   Thank you for this interesting consult.  I greatly enjoyed meeting Caitlyn Henry and look forward to participating in their care.  A copy of this report was sent to the requesting provider on this date.  Electronically Signed: Ascencion Dike, PA-C 03/23/2017, 11:24 AM   I spent a total of 20 minutes in face to face in clinical consultation, greater than 50% of which was counseling/coordinating care for kyphoplasty

## 2017-03-23 NOTE — Sedation Documentation (Signed)
Patient is resting comfortably. 

## 2017-03-23 NOTE — Discharge Instructions (Signed)
No stooping/ bending for two weeks, use a walker for two weeks, return to office in 2 weeks    KYPHOPLASTY/VERTEBROPLASTY DISCHARGE INSTRUCTIONS  Medications: (check all that apply)     Resume all home medications as before procedure.       Resume your (eliquis) on Saturday  03/25/17                 Continue your pain medications as prescribed as needed.  Over the next 3-5 days, decrease your pain medication as tolerated.  Over the counter medications (i.e. Tylenol, ibuprofen, and aleve) may be substituted once severe/moderate pain symptoms have subsided.   Wound Care: - Bandages may be removed the day following your procedure.  You may get your incision wet once bandages are removed.  Bandaids may be used to cover the incisions until scab formation.  Topical ointments are optional.  - If you develop a fever greater than 101 degrees, have increased skin redness at the incision sites or pus-like oozing from incisions occurring within 1 week of the procedure, contact radiology at [phone] or [phone].  - Ice pack to back for 15-20 minutes 2-3 time per day for first 2-3 days post procedure.  The ice will expedite muscle healing and help with the pain from the incisions.   Activity: - Bedrest today with limited activity for 24 hours post procedure.  - No driving for 48 hours.  - Increase your activity as tolerated after bedrest (with assistance if necessary).  - Refrain from any strenuous activity or heavy lifting (greater than 10 lbs.).   Follow up: -   - A physician assistant from radiology will contact you in approximately 1 week.  - If a biopsy was performed at the time of your procedure, your referring physician should receive the results in usually 2-3 days.

## 2017-03-23 NOTE — Progress Notes (Signed)
Elevated blood pressures continue, both arms checked several times, pt denies pain, pt did not take morning bp meds,  Brayton ElKevin Bruning PA was paged.

## 2017-03-23 NOTE — Telephone Encounter (Signed)
Called pt's daughter to see if they wanted to come in earlier since we had a cancellation. AW

## 2017-03-23 NOTE — Procedures (Signed)
S/P T 12 VP and L3 VP

## 2017-03-24 ENCOUNTER — Encounter (HOSPITAL_COMMUNITY): Payer: Self-pay

## 2017-03-24 ENCOUNTER — Ambulatory Visit (HOSPITAL_COMMUNITY): Payer: Medicare HMO

## 2017-03-27 ENCOUNTER — Encounter (HOSPITAL_COMMUNITY): Payer: Self-pay | Admitting: Interventional Radiology

## 2017-04-03 ENCOUNTER — Ambulatory Visit: Payer: Medicare HMO | Admitting: Internal Medicine

## 2017-04-24 ENCOUNTER — Encounter: Payer: Self-pay | Admitting: Internal Medicine

## 2017-04-24 ENCOUNTER — Ambulatory Visit (INDEPENDENT_AMBULATORY_CARE_PROVIDER_SITE_OTHER): Payer: Medicare HMO | Admitting: Internal Medicine

## 2017-04-24 VITALS — BP 158/88 | HR 68 | Temp 97.3°F | Ht 65.0 in | Wt 96.0 lb

## 2017-04-24 DIAGNOSIS — H02402 Unspecified ptosis of left eyelid: Secondary | ICD-10-CM | POA: Diagnosis not present

## 2017-04-24 DIAGNOSIS — H04203 Unspecified epiphora, bilateral lacrimal glands: Secondary | ICD-10-CM

## 2017-04-24 DIAGNOSIS — E039 Hypothyroidism, unspecified: Secondary | ICD-10-CM | POA: Diagnosis not present

## 2017-04-24 DIAGNOSIS — S22080A Wedge compression fracture of T11-T12 vertebra, initial encounter for closed fracture: Secondary | ICD-10-CM

## 2017-04-24 DIAGNOSIS — I350 Nonrheumatic aortic (valve) stenosis: Secondary | ICD-10-CM

## 2017-04-24 DIAGNOSIS — I480 Paroxysmal atrial fibrillation: Secondary | ICD-10-CM | POA: Diagnosis not present

## 2017-04-24 MED ORDER — LEVOTHYROXINE SODIUM 25 MCG PO TABS: 25.0000 ug | ORAL_TABLET | Freq: Every day | ORAL | 3 refills | Status: DC

## 2017-04-24 NOTE — Progress Notes (Signed)
Location:  [facility] clinic Provider:  Latrise Bowland L. Mariea Clonts, D.O., C.M.D.  Code Status: DNR Goals of Care:  Advanced Directives 04/24/2017  Does Patient Have a Medical Advance Directive? Yes  Type of Advance Directive Out of facility DNR (pink MOST or yellow form)  Does patient want to make changes to medical advance directive? No - Patient declined  Would patient like information on creating a medical advance directive? -  Pre-existing out of facility DNR order (yellow form or pink MOST form) Yellow form placed in chart (order not valid for inpatient use)   Chief Complaint  Patient presents with  . Medical Management of Chronic Issues    110mh follow-up  . ACP    has DNR   HPI: Patient is a 84y.o. female seen today for medical management of chronic diseases--PCP is JJanett Billow  She had a fall just before her appt with JJanett Billowin early December.   She was more fatigued and using her walker more. Gait was off.  Her son lives with her, but works and daughters help.  She was found to have a T12 compression fx and salonpas or aspercreme were recommended and MRI scheduled to further evaluate as pain was worse after that second fall.  She was already getting home health PT/OT and social work also requested at that time to help with community resources.  CT head was ordered at that time, too due to her fall, headache and "vertigo".  TSH had been slightly elevated in august and was repeated and still slightly elevated.    CT brain showed no acute intracranial abnormality, atrophy with chronic small vessel ischemic changes.  MRI thoracic and lumbar spine showed subacute on chronic benign-appearing compression fx of T12 with minimal protrusion of bone into the spinal canal w/o neural impingement.  Subacute benign compression fx of sup and inf endplates of L3 w/o protrusion of bone or disc into spinal canal.  MRIs had been ordered here and approved by insurance after authorization on 12/10, but for some reason not  done until ED visit on 12/20 for generalized weakness and mobility issues.  That day she had a syncopal episode on the way to the breakfast table (see note from ED 12/20 which was reviewed along with studies that day).  IR saw pt in ED and scheduled her for outpatient vertebroplasty. She was discharged home on oxycodone for pain control.  She already had home health.    Dec episode, body went straight and eyes flickering for 20 mins, she was stiff and stayed out of it until the EMTs arrived.  She came around after EMS arrived.  Notes don't say this from ED and workup there negative.  No EEG was done or neuro f/u.  Back was doing great until her husband (who's on a catheter) went to go empty it and it spilled all over.  She tried to clean it up with paper towels.  She hurt her back again.  She and her husband have not allowed others in the home. Needs to have someone there to do things like this.  Husband fell Monday night also.  She is herself using a walker in the house.  Her husband needs to also per daughter.  If she stays up, her back hurts.  Not bothering her sitting and lying down but bothers her walking or standing too long.    Pt lost a daughter and granddaughter now to lymphoma it sounds like.  Family is coping with a lot of  stress related to this and children are trying to continue to work and care for their parents.  Pt herself is still doing her finances with her daughter helping.    Has not been back to dermatology for her skin cancer.  Was seeing Dr. Allyson Sabal.    She weighed 162 lbs at one point.  She does eat quite a bit.    She gets up to urinate, but then cannot go to sleep in the middle of the night.  She does nap in the day some.    Daughter also reports that weight loss has been worse since her thyroid medication got stopped in march of last year.  Oddly tsh has been high not low.  Max weight in epic is 109.2 from may of 2017 and she's now 96 lbs.  Past Medical History:  Diagnosis  Date  . Anemia   . Atrial fibrillation, persistent ([place]) 06/2015   Admitted with TIA. Diagnosed with A. fib. Rate control. On Eliquis.  . Compression fracture of T12 vertebra ([place])   . Hypothyroid   . Lyme disease 2017  . Stroke [facility]) 06/2015   -TIA    Past Surgical History:  Procedure Laterality Date  . ABDOMINAL HYSTERECTOMY  1968  . BLADDER SUSPENSION    . IR VERTEBROPLASTY CERV/THOR BX INC UNI/BIL INC/INJECT/IMAGING  03/23/2017  . IR VERTEBROPLASTY EA ADDL (T&LS) BX INC UNI/BIL INC INJECT/IMAGING  03/23/2017  . TRANSTHORACIC ECHOCARDIOGRAM  06/2015   In setting of TIA.  Moderate basal hypertrophy. Vigorous function with EF 65-70%. Mild aortic stenosis with mean gradient 11 mmHg. Severe MAC with mild MR. Moderate LA dilation.    Allergies  Allergen Reactions  . Tylenol [Acetaminophen] Other (See Comments)    Makes my body feel numb     Outpatient Encounter Medications as of 04/24/2017  Medication Sig  . ELIQUIS 2.5 MG TABS tablet TAKE 1 TABLET BY MOUTH TWICE A DAY  . magnesium hydroxide (MILK OF MAGNESIA) 400 MG/5ML suspension Take 15 mLs by mouth daily as needed for mild constipation.  . meclizine (ANTIVERT) 12.5 MG tablet Take 1 tablet (12.5 mg total) by mouth 3 (three) times daily as needed for dizziness or nausea.  . metoprolol tartrate (LOPRESSOR) 25 MG tablet TAKE 0.5 TABLETS (12.5 MG TOTAL) BY MOUTH 2 (TWO) TIMES DAILY.  Marland Kitchen levothyroxine (SYNTHROID, LEVOTHROID) 25 MCG tablet Take 1 tablet (25 mcg total) by mouth daily before breakfast.  . [DISCONTINUED] oxyCODONE (ROXICODONE) 5 MG immediate release tablet Take 0.5 tablets (2.5 mg total) by mouth every 4 (four) hours as needed for severe pain.  . [DISCONTINUED] traMADol (ULTRAM) 50 MG tablet Take 1 tablet (50 mg total) by mouth every 6 (six) hours as needed.  . [DISCONTINUED] trimethoprim-polymyxin b (POLYTRIM) ophthalmic solution Place 1 drop into both eyes QID.   No facility-administered encounter medications on file as of  04/24/2017.     Review of Systems:  Review of Systems  Constitutional: Positive for weight loss. Negative for chills and fever.  HENT: Negative for congestion.   Eyes: Positive for blurred vision.       Glasses and no eye exam for many years  Respiratory: Negative for cough and shortness of breath.   Cardiovascular: Negative for chest pain, palpitations and leg swelling.  Gastrointestinal: Negative for abdominal pain, blood in stool, constipation and melena.  Genitourinary: Negative for dysuria.  Musculoskeletal: Positive for back pain and falls. Negative for joint pain.  Skin: Negative for itching and rash.  Skin cancer areas on shins and right hand  Neurological: Negative for dizziness and loss of consciousness.       Unsteady gait, came w/o assistive device but supposed to be using walker (does not faithfully and furniture surfs per daughter)  Endo/Heme/Allergies: Bruises/bleeds easily.  Psychiatric/Behavioral: Positive for memory loss.    Health Maintenance  Topic Date Due  . TETANUS/TDAP  08/16/1942  . DEXA SCAN  08/17/1990  . PNA vac Low Risk Adult (2 of 2 - PPSV23) 02/15/2018  . INFLUENZA VACCINE  Completed    Physical Exam: Vitals:   04/24/17 1108  BP: (!) 158/88  Pulse: 68  Temp: (!) 97.3 F (36.3 C)  TempSrc: Oral  Weight: 96 lb (43.5 kg)  Height: _0  (1.651 m)   Body mass index is 15.98 kg/m. Physical Exam  Constitutional: She is oriented to person, place, and time. No distress.  Cachectic appearing white female   HENT:  Head: Normocephalic and atraumatic.  Eyes: EOM are normal. Pupils are equal, round, and reactive to light.  Left lower lid ptosis; glasses from many years ago  Neck: Neck supple.  Cardiovascular: Intact distal pulses.  irreg irreg, 2/6 systolic murmur at right sternal border and radiating to carotids and heard throughout precordium  Pulmonary/Chest: Effort normal and breath sounds normal. She has no rales.  Musculoskeletal: Normal  range of motion.  Neurological: She is alert and oriented to person, place, and time.  Skin: Skin is warm and dry.  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    Labs reviewed: Basic Metabolic Panel: Recent Labs    05/20/16 1343  01/09/17 1053 02/24/17 1020 03/09/17 1204 03/23/17 1017  NA  --    < > 135 139 133* 136  K  --    < > 4.7 4.4 4.7 4.0  CL  --    < > 97* 101 99* 102  CO2  --    < > _1 GLUCOSE  --    < > 98 97 122* 107*  BUN  --    < > 19 28* 27* 22*  CREATININE  --    < > 0.99* 0.91* 1.06* 1.07*  CALCIUM  --    < > 9.9 9.5 9.3 9.5  TSH 5.549*  --  5.83* 6.63*  --   --    < > = values in this interval not displayed.   Liver Function Tests: Recent Labs    05/22/16 0423 01/09/17 1053 02/24/17 1020 03/09/17 1204  AST _2 ALT 12* 16 11 12*  ALKPHOS 74  --   --  123  BILITOT 0.6 0.8 0.6 0.6  PROT 6.1* 7.9 7.6 7.8  ALBUMIN 3.0*  --   --  3.8   No results for input(s): LIPASE, AMYLASE in the last 8760 hours. No results for input(s): AMMONIA in the last 8760 hours. CBC: Recent Labs    05/20/16 1044  01/09/17 1053 02/24/17 1020 03/09/17 1204 03/23/17 1017  WBC 11.5*   < > 8.0 8.7 8.1 6.9  NEUTROABS 8.9*  --  5,176 5,490  --   --   HGB 13.4   < > 13.3 12.2 13.8 13.1  HCT 40.5   < > 39.0 36.3 41.5 39.8  MCV 90.2   < > 90.5 91.7 93.3 95.0  PLT 233   < > 224 271 280 235   < > =  values in this interval not displayed.   Lipid Panel: No results for input(s): CHOL, HDL, LDLCALC, TRIG, CHOLHDL, LDLDIRECT in the last 8760 hours. Lab Results  Component Value Date   HGBA1C 6.3 (H) 07/14/2015    Assessment/Plan 1. Ptosis, left eyelid - pt with dizziness and has not had visual assessment in years, refer to Dr. Gershon Crane for eval and tx - Ambulatory referral to Ophthalmology  2. Watery eyes - likely dry eyes and some blepharitis it sounds like - Ambulatory referral to  Ophthalmology  3. Acquired hypothyroidism - restart thyroid medication due to continued weight loss and tsh trending up -f/u with Janett Billow in 6 wks for weight recheck and tsh recheck - levothyroxine (SYNTHROID, LEVOTHROID) 25 MCG tablet; Take 1 tablet (25 mcg total) by mouth daily before breakfast.  Dispense: 30 tablet; Refill: 3  4. Paroxysmal atrial fibrillation (HCC) -ongoing, stable, rate controlled and tolerating eliquis (is high stroke risk, but also high risk for bleed from fall given multiple falls and not using walker)  5. Mild aortic stenosis by prior echocardiogram -suspect worse than mild now based on clinical exam--?any contribution to syncopal episode she had -follows with cardiology and may need repeat echo to reassess  6. T12 compression fracture ([place]) -improved after kyphoplasty, but having a little pain again after overdoing (cleaning up husband's leaking catheter bag) -family has hired caregiver to help with such tasks who starts this afternoon -pt doing her therapy and must use her walker as directed  Labs/tests ordered:   Orders Placed This Encounter  Procedures  . Ambulatory referral to Ophthalmology    Referral Priority:   Routine    Referral Type:   Consultation    Referral Reason:   Specialty Services Required    Requested Specialty:   Ophthalmology    Number of Visits Requested:   1   Next appt:  F/u with Jessica on thyroid and weight in 6 wks  Zayonna Ayuso L. Leaann Nevils, D.O. [facility] [address], [place] [address] Cell Phone (Mon-Fri 8am-5pm):  [phone] On Call:  [phone] & follow prompts after 5pm & weekends Office Phone:  [phone] Office Fax:  [phone]

## 2017-05-11 ENCOUNTER — Telehealth: Payer: Self-pay | Admitting: Cardiology

## 2017-05-11 NOTE — Telephone Encounter (Signed)
Returned the call to the patient's daughter, per dpr. She stated that the patient has been having occasional palpitations when she gets up from bed (she spends the majority of time in bed) The patient has a history of afib. The daughter does not check or pulse or heart rate. The patient stated that she only gets short of breath when she ambulates. When she goes back to bed, her palpitations and shortness of breath resolves. The daughter would like to know if her mother should take an extra metoprolol when her palpitations occur.   The patient has an appointment with Dr. Herbie BaltimoreHarding on 2/26.

## 2017-05-11 NOTE — Telephone Encounter (Signed)
New message  Patient daughter calling to report palpitations that started 1 week ago.   Patient c/o Palpitations:  High priority if patient c/o lightheadedness, shortness of breath, or chest pain  1) How long have you had palpitations/irregular HR/ Afib? Are you having the symptoms now? 1 WEEK  2) Are you currently experiencing lightheadedness, SOB or CP? NO  3) Do you have a history of afib (atrial fibrillation) or irregular heart rhythm? YES  4) Have you checked your BP or HR? (document readings if available): NO  5) Are you experiencing any other symptoms? NO

## 2017-05-12 NOTE — Telephone Encounter (Signed)
That sounds reasonable.  May be first try starting off with a half dose of metoprolol as needed to see how she does blood pressure and other symptoms are stable, then next time try full dose.  Bryan Lemmaavid Harding, MD

## 2017-05-12 NOTE — Telephone Encounter (Signed)
Patient's daughter made aware of instructions and has verbalized her understanding.

## 2017-05-16 ENCOUNTER — Encounter: Payer: Self-pay | Admitting: Cardiology

## 2017-05-16 ENCOUNTER — Ambulatory Visit: Payer: Medicare HMO | Admitting: Cardiology

## 2017-05-16 VITALS — BP 120/74 | Ht 65.0 in | Wt 95.4 lb

## 2017-05-16 DIAGNOSIS — I749 Embolism and thrombosis of unspecified artery: Secondary | ICD-10-CM

## 2017-05-16 DIAGNOSIS — Z7901 Long term (current) use of anticoagulants: Secondary | ICD-10-CM | POA: Diagnosis not present

## 2017-05-16 DIAGNOSIS — I480 Paroxysmal atrial fibrillation: Secondary | ICD-10-CM

## 2017-05-16 DIAGNOSIS — G459 Transient cerebral ischemic attack, unspecified: Secondary | ICD-10-CM

## 2017-05-16 DIAGNOSIS — R42 Dizziness and giddiness: Secondary | ICD-10-CM

## 2017-05-16 DIAGNOSIS — I481 Persistent atrial fibrillation: Secondary | ICD-10-CM

## 2017-05-16 DIAGNOSIS — I4819 Other persistent atrial fibrillation: Secondary | ICD-10-CM

## 2017-05-16 DIAGNOSIS — I10 Essential (primary) hypertension: Secondary | ICD-10-CM | POA: Diagnosis not present

## 2017-05-16 DIAGNOSIS — I350 Nonrheumatic aortic (valve) stenosis: Secondary | ICD-10-CM

## 2017-05-16 NOTE — Progress Notes (Signed)
PCP: [name], NP  Clinic Note: Chief Complaint  Patient presents with  . Follow-up  . Atrial Fibrillation  . Shortness of Breath    HPI: Caitlyn Henry is a 84 y.o. female with a PMH below who presents today for Essentially annual follow-up for paroxysmal A. fib.Marland Kitchen She was admitted to [facility] on 07/14/2015 with weakness and decreased responsiveness.  She carries a diagnosis of Lyme disease that has been treated but still she has symptoms.  When she presented with TIA symptoms, she was in A. fib. The working diagnosis was that she had an embolic TIA related to her A. fib. She was relatively unaware of being in A. fib at that time  [place] was last seen In June 2018.  She had multiple falls at least 6 in 4 months.  None more related to syncope or near syncope.  Are all mechanical falls due to poor balance.  She has no sense of A. fib.  We decided to continue Eliquis at that time.  Recent Hospitalizations:   Dec 2018: Significant back pain without fall.  Pain kept her from being able to walk.  Studies Personally Reviewed - (if available, images/films reviewed: From Epic Chart or Care Everywhere)  None  Interval History: Since I last saw Caitlyn, she is actually been doing fairly well besides her back pain from her fall back in February.  She has not had that many recent falls however.  She is does not noticed any dizziness, her falls have been simply because of losing her balance.  She indicates that only occasionally will she note her heart rate going up for instance if she starts exerting herself or if she gets excited or upset, or has lots of pain.  Otherwise once she settles down her rate goes back to normal and she feels fine.  She denies any chest pain associated with it.  No dyspnea associated with it.  She has not had any bleeding issues of melena, hematochezia, hematuria, epistaxis or significant bruising.   No TIA or amaurosis fugax symptoms.  No  syncope or near syncope type symptoms.  No claudication.  ROS: A comprehensive was performed. Review of Systems  HENT: Negative for congestion and nosebleeds.   Eyes: Negative.   Respiratory: Negative for cough, shortness of breath and wheezing.   Cardiovascular: Positive for palpitations (Rare palpitations. Nothing to suggest awareness of A. fib.).  Gastrointestinal: Positive for constipation. Negative for abdominal pain, blood in stool, heartburn and melena.  Genitourinary: Negative for frequency and hematuria.  Musculoskeletal: Positive for back pain (Related to her recent fall with spine fracture).  Neurological: Positive for dizziness (But not recently) and weakness (Global). Negative for focal weakness and headaches.  Psychiatric/Behavioral: Positive for memory loss.  All other systems reviewed and are negative.  I have reviewed and (if needed) personally updated the patient's problem list, medications, allergies, past medical and surgical history, social and family history.   Past Medical History:  Diagnosis Date  . Anemia   . Atrial fibrillation, persistent ([place]) 06/2015   Admitted with TIA. Diagnosed with A. fib. Rate control. On Eliquis.  . Compression fracture of T12 vertebra ([place])   . Hypothyroid   . Lyme disease 2017  . Stroke [facility]) 06/2015   -TIA    Past Surgical History:  Procedure Laterality Date  . ABDOMINAL HYSTERECTOMY  1968  . BLADDER SUSPENSION    . IR VERTEBROPLASTY CERV/THOR BX INC UNI/BIL INC/INJECT/IMAGING  03/23/2017  . IR VERTEBROPLASTY  EA ADDL (T&LS) BX INC UNI/BIL INC INJECT/IMAGING  03/23/2017  . TRANSTHORACIC ECHOCARDIOGRAM  06/2015   In setting of TIA.  Moderate basal hypertrophy. Vigorous function with EF 65-70%. Mild aortic stenosis with mean gradient 11 mmHg. Severe MAC with mild MR. Moderate LA dilation.    Current Meds  Medication Sig  . ELIQUIS 2.5 MG TABS tablet TAKE 1 TABLET BY MOUTH TWICE A DAY  . magnesium hydroxide (MILK OF MAGNESIA)  400 MG/5ML suspension Take 15 mLs by mouth daily as needed for mild constipation.  . meclizine (ANTIVERT) 12.5 MG tablet Take 1 tablet (12.5 mg total) by mouth 3 (three) times daily as needed for dizziness or nausea.  . [DISCONTINUED] metoprolol tartrate (LOPRESSOR) 25 MG tablet TAKE 0.5 TABLETS (12.5 MG TOTAL) BY MOUTH 2 (TWO) TIMES DAILY.    Allergies  Allergen Reactions  . Tylenol [Acetaminophen] Other (See Comments)    Makes my body feel numb     Social History   Tobacco Use  . Smoking status: Never Smoker  . Smokeless tobacco: Never Used  Substance Use Topics  . Alcohol use: No  . Drug use: No   family history includes Atrial fibrillation in her brother; Cancer (age of onset: 18) in her mother; Heart attack in her cousin; Hypertension in her daughter, son, and son; Pneumonia (age of onset: 47) in her father; Transient ischemic attack in her sister.  Wt Readings from Last 3 Encounters:  05/16/17 95 lb 6.4 oz (43.3 kg)  04/24/17 96 lb (43.5 kg)  03/23/17 95 lb (43.1 kg)    PHYSICAL EXAM BP 120/74 (BP Location: Right Arm, Patient Position: Sitting, Cuff Size: Normal)   Ht 5' 5"  (1.651 m)   Wt 95 lb 6.4 oz (43.3 kg)   BMI 15.88 kg/m   Physical Exam  Constitutional: She is oriented to person, place, and time. No distress.  Thin, frail elderly woman who appears to.  Not emaciated, but borderline cachectic.  HENT:  Head: Normocephalic and atraumatic.  Neck: No hepatojugular reflux and no JVD present. Carotid bruit is not present.  Cardiovascular: Normal rate. An irregularly irregular rhythm present. Exam reveals no gallop and no friction rub.  Murmur heard.  Medium-pitched harsh crescendo-decrescendo midsystolic murmur is present with a grade of 2/6 at the upper right sternal border radiating to the neck. Pulmonary/Chest: Breath sounds normal. No respiratory distress. She has no wheezes. She has no rales. She exhibits tenderness (Thoracic rib and vertebrae pain).  Mildly  diminished breath sounds at the bases with poor respiratory effort.  Abdominal: Soft. Bowel sounds are normal. She exhibits no distension. There is no tenderness.  Musculoskeletal: Normal range of motion.  Thoracic kyphosis  Neurological: She is alert and oriented to person, place, and time.  Skin: Skin is warm and dry.  Psychiatric: She has a normal mood and affect. Her behavior is normal.  She does answer questions, but oftentimes defers to her daughter.    Adult ECG Report  Rate: 69;  Rhythm: normal sinus rhythm, atrial fibrillation and Poor R-wave progression, cannot exclude anterior MI, age undetermined.;   Narrative Interpretation: Stable EKG -A. fib not present   Other studies Reviewed: Additional studies/ records that were reviewed today include:  Recent Labs:   Lab Results  Component Value Date   CREATININE 1.07 (H) 03/23/2017   BUN 22 (H) 03/23/2017   NA 136 03/23/2017   K 4.0 03/23/2017   CL 102 03/23/2017   CO2 27 03/23/2017    ASSESSMENT / PLAN:  Problem List Items Addressed This Visit    Chronic anticoagulation    Tolerating Eliquis with no issues.  On low-dose because of age      Dizziness, nonspecific    I worry that she may be somewhat dehydrated.  Skin looks dry, and the fact that she gets dizzy sometimes with beta-blocker dosing is concerning.  We talked about adequate hydration and try to make sure that she does eat.      Essential hypertension (Chronic)    I do not think this is a diagnosis for her.  Borderline low pressures on low beta-blocker.  Would not titrate further.      Relevant Medications   metoprolol tartrate (LOPRESSOR) 25 MG tablet   Other Relevant Orders   EKG 12-Lead (Completed)   Mild aortic stenosis by prior echocardiogram (Chronic)    Mild stenosis.  Minimal murmur on exam.  Would only follow-up if things change or symptoms change.  She and her daughter do not seem to be interested in aggressive evaluation.      Relevant  Medications   metoprolol tartrate (LOPRESSOR) 25 MG tablet   Other Relevant Orders   EKG 12-Lead (Completed)   Paroxysmal atrial fibrillation (HCC)   Relevant Medications   metoprolol tartrate (LOPRESSOR) 25 MG tablet   Other Relevant Orders   EKG 12-Lead (Completed)   Persistent atrial fibrillation (HCC) -- CHA2DS2-VASc Score 6, on Low Dose Eliquis - Primary (Chronic)    Pretty asymptomatic even today being in A. fib she is not aware of it.  She does feel some tachycardia spells which seem to be appropriately tachycardic with discomfort or anxiety or stress.   As per discussion, with minimal falls now, she is fine continuing to use Eliquis. No plans for rhythm control.  Continue simple rate controlI --  we will increase her dosing slightly: Take a full metoprolol pill in the morning and 12 point 5 at night.  She can take the additional 12.5 as needed for tachycardia.  She has done this on occasion and tolerated it fine.      Relevant Medications   metoprolol tartrate (LOPRESSOR) 25 MG tablet   TIA due to embolism [facility])    Mostly because of this prior history of stroke/TIA, she is high enough risk to continue anticoagulation.  She and her daughter do not want another stroke and therefore they are willing to take the risk with full anticoagulation      Relevant Medications   metoprolol tartrate (LOPRESSOR) 25 MG tablet      Current medicines are reviewed at length with the patient today. (+/- concerns)  -none The following changes have been made:No changes.  Okay to use as needed additional beta-blocker  Patient Instructions  Drink 8 to 10 glasses water every day    Metoprolol 25 mg every morning  12.5 mg every afternoon     Your physician recommends that you schedule a follow-up appointment in 6 months    Studies Ordered:   Orders Placed This Encounter  Procedures  . EKG 12-Lead      Glenetta Hew, M.D., M.S. Interventional Cardiologist   Pager #  [phone] Phone # [phone] [address] [place], [place] [address]

## 2017-05-16 NOTE — Patient Instructions (Signed)
Drink 8 to 10 glasses water every day    Metoprolol 25 mg every morning  12.5 mg every afternoon     Your physician recommends that you schedule a follow-up appointment in 6 months

## 2017-05-18 ENCOUNTER — Encounter: Payer: Self-pay | Admitting: Cardiology

## 2017-05-18 NOTE — Assessment & Plan Note (Signed)
Tolerating Eliquis with no issues.  On low-dose because of age

## 2017-05-18 NOTE — Assessment & Plan Note (Signed)
Pretty asymptomatic even today being in A. fib she is not aware of it.  She does feel some tachycardia spells which seem to be appropriately tachycardic with discomfort or anxiety or stress.   As per discussion, with minimal falls now, she is fine continuing to use Eliquis. No plans for rhythm control.  Continue simple rate controlI --  we will increase her dosing slightly: Take a full metoprolol pill in the morning and 12 point 5 at night.  She can take the additional 12.5 as needed for tachycardia.  She has done this on occasion and tolerated it fine.

## 2017-05-18 NOTE — Assessment & Plan Note (Signed)
I worry that she may be somewhat dehydrated.  Skin looks dry, and the fact that she gets dizzy sometimes with beta-blocker dosing is concerning.  We talked about adequate hydration and try to make sure that she does eat.

## 2017-05-18 NOTE — Assessment & Plan Note (Signed)
Mild stenosis.  Minimal murmur on exam.  Would only follow-up if things change or symptoms change.  She and her daughter do not seem to be interested in aggressive evaluation.

## 2017-05-18 NOTE — Assessment & Plan Note (Signed)
Mostly because of this prior history of stroke/TIA, she is high enough risk to continue anticoagulation.  She and her daughter do not want another stroke and therefore they are willing to take the risk with full anticoagulation

## 2017-05-18 NOTE — Assessment & Plan Note (Signed)
I do not think this is a diagnosis for her.  Borderline low pressures on low beta-blocker.  Would not titrate further.

## 2017-06-06 ENCOUNTER — Ambulatory Visit (INDEPENDENT_AMBULATORY_CARE_PROVIDER_SITE_OTHER): Payer: Medicare HMO | Admitting: Nurse Practitioner

## 2017-06-06 ENCOUNTER — Encounter: Payer: Self-pay | Admitting: Nurse Practitioner

## 2017-06-06 VITALS — BP 128/78 | HR 59 | Temp 97.2°F | Ht 65.0 in | Wt 96.0 lb

## 2017-06-06 DIAGNOSIS — S22080A Wedge compression fracture of T11-T12 vertebra, initial encounter for closed fracture: Secondary | ICD-10-CM

## 2017-06-06 DIAGNOSIS — E039 Hypothyroidism, unspecified: Secondary | ICD-10-CM | POA: Diagnosis not present

## 2017-06-06 DIAGNOSIS — R634 Abnormal weight loss: Secondary | ICD-10-CM

## 2017-06-06 DIAGNOSIS — I480 Paroxysmal atrial fibrillation: Secondary | ICD-10-CM

## 2017-06-06 LAB — TSH: TSH: 9.31 mIU/L — ABNORMAL HIGH (ref 0.40–4.50)

## 2017-06-06 NOTE — Progress Notes (Signed)
Careteam: Patient Care Team: [name], NP as PCP - General (Geriatric Medicine) [name], NP as PCP - Internal Medicine (Geriatric Medicine) Glennie Isle, PA-C as Consulting Physician (Physician Assistant) Leonie Man, MD as Consulting Physician (Cardiology)  Advanced Directive information    Allergies  Allergen Reactions  . Tylenol [Acetaminophen] Other (See Comments)    Makes my body feel numb     Chief Complaint  Patient presents with  . Medical Management of Chronic Issues    6 week F/U  . Medication Refill     HPI: Patient is a 84 y.o. female seen in the office today for 6 week follow up.  Saw Dr Mariea Clonts on 04/24/17 for routine management. She has had multiple falls, IR saw pt in ED when she went after fall on 12/20 and scheduled her for outpatient vertebroplasty. At that time reported pain has improved and PT was ordered.  Reports back pain has improved significantly. Not needing any pain medication.   TSH was trending up therefore synthroid 25 mcg added. When she started synthroid she broke out in hives, was very itchy to her back and face, stopped taking it and this improved. Took for a few days (2).   Weight has been stable at 96 lbs since last OV. Does not drink boost everyday but states "it makes me feel better when I do"  Also saw ophthalmologist- has cataracts do not recommend removing due to age. Rx for new glasses given.   afib- rate controlled, taking eliquis and lopressor for rate.   Using allegra daily for allergies.   Has episode where she was out of it, body when straight and eyes flickered, entire episode lasted ~30 mins EMS called and sent to ED where workup was negative but no neuro follow up on EEG- pt does not wish to see neurologist or have further work up at this time.   Review of Systems:  Review of Systems  Constitutional: Positive for weight loss. Negative for chills and fever.  HENT: Negative for congestion.     Eyes: Positive for blurred vision.       Cataracts   Respiratory: Negative for cough and shortness of breath.   Cardiovascular: Negative for chest pain, palpitations and leg swelling.  Gastrointestinal: Positive for constipation. Negative for abdominal pain, blood in stool and melena.  Genitourinary: Negative for dysuria.  Musculoskeletal: Positive for back pain (improved) and falls (last fall several months agol). Negative for joint pain.  Skin: Negative for itching and rash.       Skin cancer areas on shins and right hand  Neurological: Negative for dizziness and loss of consciousness.       Unsteady gait, using walker   Endo/Heme/Allergies: Bruises/bleeds easily.  Psychiatric/Behavioral: Positive for memory loss.    Past Medical History:  Diagnosis Date  . Anemia   . Atrial fibrillation, persistent ([place]) 06/2015   Admitted with TIA. Diagnosed with A. fib. Rate control. On Eliquis.  . Compression fracture of T12 vertebra ([place])   . Hypothyroid   . Lyme disease 2017  . Stroke [facility]) 06/2015   -TIA   Past Surgical History:  Procedure Laterality Date  . ABDOMINAL HYSTERECTOMY  1968  . BLADDER SUSPENSION    . IR VERTEBROPLASTY CERV/THOR BX INC UNI/BIL INC/INJECT/IMAGING  03/23/2017  . IR VERTEBROPLASTY EA ADDL (T&LS) BX INC UNI/BIL INC INJECT/IMAGING  03/23/2017  . TRANSTHORACIC ECHOCARDIOGRAM  06/2015   In setting of TIA.  Moderate basal hypertrophy.  Vigorous function with EF 65-70%. Mild aortic stenosis with mean gradient 11 mmHg. Severe MAC with mild MR. Moderate LA dilation.   Social History:   reports that  has never smoked. she has never used smokeless tobacco. She reports that she does not drink alcohol or use drugs.  Family History  Problem Relation Age of Onset  . Heart attack Cousin   . Cancer Mother 21       breast  . Pneumonia Father 12  . Transient ischemic attack Sister   . Atrial fibrillation Brother   . Hypertension Daughter   . Hypertension Son   .  Hypertension Son     Medications: Patient's Medications  New Prescriptions   No medications on file  Previous Medications   ELIQUIS 2.5 MG TABS TABLET    TAKE 1 TABLET BY MOUTH TWICE A DAY   MAGNESIUM HYDROXIDE (MILK OF MAGNESIA) 400 MG/5ML SUSPENSION    Take 15 mLs by mouth daily as needed for mild constipation.   MECLIZINE (ANTIVERT) 12.5 MG TABLET    Take 1 tablet (12.5 mg total) by mouth 3 (three) times daily as needed for dizziness or nausea.   METOPROLOL TARTRATE (LOPRESSOR) 25 MG TABLET    Take 25 mg every morning and take 1/2 tablet ( 12.5 mg ) every afternoon  Modified Medications   No medications on file  Discontinued Medications   No medications on file     Physical Exam:  Vitals:   06/06/17 1013  BP: 128/78  Pulse: (!) 59  Temp: (!) 97.2 F (36.2 C)  TempSrc: Oral  Weight: 96 lb (43.5 kg)  Height: _0  (1.651 m)   Body mass index is 15.98 kg/m.  Physical Exam  Constitutional: She is oriented to person, place, and time. No distress.  Cachectic appearing white female   HENT:  Head: Normocephalic and atraumatic.  Eyes: EOM are normal. Pupils are equal, round, and reactive to light.  Left lower lid ptosis; glasses from many years ago  Neck: Neck supple.  Cardiovascular: Intact distal pulses. An irregularly irregular rhythm present.  Murmur heard.  Systolic murmur is present with a grade of 2/6. Pulmonary/Chest: Effort normal and breath sounds normal. She has no rales.  Musculoskeletal: Normal range of motion.  Neurological: She is alert and oriented to person, place, and time.  Skin: Skin is warm and dry.  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    Labs reviewed: Basic Metabolic Panel: Recent Labs    01/09/17 1053 02/24/17 1020 03/09/17 1204 03/23/17 1017  NA 135 139 133* 136  K 4.7 4.4 4.7 4.0  CL 97* 101 99* 102  CO2 _1 GLUCOSE 98 97 122* 107*  BUN 19 28* 27* 22*    CREATININE 0.99* 0.91* 1.06* 1.07*  CALCIUM 9.9 9.5 9.3 9.5  TSH 5.83* 6.63*  --   --    Liver Function Tests: Recent Labs    01/09/17 1053 02/24/17 1020 03/09/17 1204  AST _2 ALT 16 11 12*  ALKPHOS  --   --  123  BILITOT 0.8 0.6 0.6  PROT 7.9 7.6 7.8  ALBUMIN  --   --  3.8   No results for input(s): LIPASE, AMYLASE in the last 8760 hours. No results for input(s): AMMONIA in the last 8760 hours. CBC: Recent Labs    01/09/17 1053 02/24/17 1020 03/09/17 1204 03/23/17 1017  WBC 8.0  8.7 8.1 6.9  NEUTROABS 5,176 5,490  --   --   HGB 13.3 12.2 13.8 13.1  HCT 39.0 36.3 41.5 39.8  MCV 90.5 91.7 93.3 95.0  PLT 224 271 280 235   Lipid Panel: No results for input(s): CHOL, HDL, LDLCALC, TRIG, CHOLHDL, LDLDIRECT in the last 8760 hours. TSH: Recent Labs    01/09/17 1053 02/24/17 1020  TSH 5.83* 6.63*   A1C: Lab Results  Component Value Date   HGBA1C 6.3 (H) 07/14/2015     Assessment/Plan 1. Acquired hypothyroidism -could not tolerate levothyroxine due to hives, will follow up TSH, may need alternative.  - TSH  2. Weight loss Weight has been unchanged, encouraged proper protein intake with ensure/boost daily after meal  3. T12 compression fracture (HCC) -s/p kyphoplasty, symptoms have improved without ongoing pain.   4. Paroxysmal atrial fibrillation (HCC) Rate controlled, continues on eliquis and metoprolol   Next appt: 3 months for routine follow up Andrew. [place], [facility] [phone]

## 2017-06-08 ENCOUNTER — Other Ambulatory Visit: Payer: Self-pay

## 2017-06-08 ENCOUNTER — Other Ambulatory Visit: Payer: Self-pay | Admitting: Nurse Practitioner

## 2017-06-08 DIAGNOSIS — E039 Hypothyroidism, unspecified: Secondary | ICD-10-CM

## 2017-06-08 MED ORDER — LEVOTHYROXINE SODIUM 25 MCG PO TABS: 25.0000 ug | ORAL_TABLET | Freq: Every day | ORAL | 1 refills | Status: DC

## 2017-07-25 ENCOUNTER — Other Ambulatory Visit: Payer: Medicare HMO

## 2017-07-25 DIAGNOSIS — E039 Hypothyroidism, unspecified: Secondary | ICD-10-CM

## 2017-07-25 LAB — TSH: TSH: 8.58 mIU/L — ABNORMAL HIGH (ref 0.40–4.50)

## 2017-07-31 ENCOUNTER — Other Ambulatory Visit: Payer: Self-pay

## 2017-07-31 ENCOUNTER — Telehealth: Payer: Self-pay

## 2017-07-31 DIAGNOSIS — E039 Hypothyroidism, unspecified: Secondary | ICD-10-CM

## 2017-07-31 MED ORDER — LEVOTHYROXINE SODIUM 50 MCG PO TABS: 50.0000 ug | ORAL_TABLET | Freq: Every day | ORAL | 2 refills | Status: DC

## 2017-07-31 NOTE — Telephone Encounter (Signed)
-----   Message from Sharon Seller, NP sent at 07/26/2017  8:43 AM EDT ----- TSH remains elevated, hopefully she has been tolerating the [facility] name synthroid 25 mcg and taking this every day if so, lets increase synthroid to 50 mcg daily and again will need follow up TSH in 2 months

## 2017-08-08 ENCOUNTER — Other Ambulatory Visit: Payer: Self-pay | Admitting: Cardiology

## 2017-08-08 ENCOUNTER — Other Ambulatory Visit: Payer: Self-pay | Admitting: Internal Medicine

## 2017-08-08 DIAGNOSIS — I10 Essential (primary) hypertension: Secondary | ICD-10-CM

## 2017-08-08 DIAGNOSIS — I4891 Unspecified atrial fibrillation: Secondary | ICD-10-CM

## 2017-08-08 NOTE — Telephone Encounter (Signed)
REFIL 

## 2017-09-19 ENCOUNTER — Encounter: Payer: Self-pay | Admitting: Nurse Practitioner

## 2017-09-19 ENCOUNTER — Ambulatory Visit: Payer: Medicare HMO | Admitting: Nurse Practitioner

## 2017-09-19 ENCOUNTER — Ambulatory Visit
Admission: RE | Admit: 2017-09-19 | Discharge: 2017-09-19 | Disposition: A | Discharge status: Home / Self Care | Payer: Medicare HMO | Source: Ambulatory Visit | Attending: Nurse Practitioner | Admitting: Nurse Practitioner

## 2017-09-19 ENCOUNTER — Other Ambulatory Visit: Payer: Self-pay | Admitting: Nurse Practitioner

## 2017-09-19 VITALS — BP 120/72 | HR 69 | Temp 98.6°F | Ht 65.0 in | Wt 101.6 lb

## 2017-09-19 DIAGNOSIS — I480 Paroxysmal atrial fibrillation: Secondary | ICD-10-CM | POA: Diagnosis not present

## 2017-09-19 DIAGNOSIS — S22080A Wedge compression fracture of T11-T12 vertebra, initial encounter for closed fracture: Secondary | ICD-10-CM

## 2017-09-19 DIAGNOSIS — J209 Acute bronchitis, unspecified: Secondary | ICD-10-CM

## 2017-09-19 DIAGNOSIS — I10 Essential (primary) hypertension: Secondary | ICD-10-CM

## 2017-09-19 DIAGNOSIS — E039 Hypothyroidism, unspecified: Secondary | ICD-10-CM

## 2017-09-19 DIAGNOSIS — R9389 Abnormal findings on diagnostic imaging of other specified body structures: Secondary | ICD-10-CM

## 2017-09-19 LAB — COMPREHENSIVE METABOLIC PANEL
AG RATIO: 1.3 (calc) (ref 1.0–2.5)
ALBUMIN MSPROF: 4.1 g/dL (ref 3.6–5.1)
ALT: 23 U/L (ref 6–29)
AST: 28 U/L (ref 10–35)
Alkaline phosphatase (APISO): 62 U/L (ref 33–130)
BILIRUBIN TOTAL: 0.8 mg/dL (ref 0.2–1.2)
BUN/Creatinine Ratio: 27 (calc) — ABNORMAL HIGH (ref 6–22)
BUN: 25 mg/dL (ref 7–25)
CALCIUM: 9.2 mg/dL (ref 8.6–10.4)
CHLORIDE: 98 mmol/L (ref 98–110)
CO2: 29 mmol/L (ref 20–32)
Creat: 0.91 mg/dL — ABNORMAL HIGH (ref 0.60–0.88)
Globulin: 3.1 g/dL (calc) (ref 1.9–3.7)
Glucose, Bld: 99 mg/dL (ref 65–139)
POTASSIUM: 4.5 mmol/L (ref 3.5–5.3)
SODIUM: 135 mmol/L (ref 135–146)
TOTAL PROTEIN: 7.2 g/dL (ref 6.1–8.1)

## 2017-09-19 LAB — CBC WITH DIFFERENTIAL/PLATELET
Basophils Absolute: 63 cells/uL (ref 0–200)
Basophils Relative: 0.8 %
EOS PCT: 1.3 %
Eosinophils Absolute: 103 cells/uL (ref 15–500)
HEMATOCRIT: 36.7 % (ref 35.0–45.0)
HEMOGLOBIN: 12.3 g/dL (ref 11.7–15.5)
LYMPHS ABS: 2204 {cells}/uL (ref 850–3900)
MCH: 31.2 pg (ref 27.0–33.0)
MCHC: 33.5 g/dL (ref 32.0–36.0)
MCV: 93.1 fL (ref 80.0–100.0)
MPV: 9.5 fL (ref 7.5–12.5)
Monocytes Relative: 7.4 %
NEUTROS ABS: 4945 {cells}/uL (ref 1500–7800)
Neutrophils Relative %: 62.6 %
Platelets: 226 10*3/uL (ref 140–400)
RBC: 3.94 10*6/uL (ref 3.80–5.10)
RDW: 12.9 % (ref 11.0–15.0)
Total Lymphocyte: 27.9 %
WBC: 7.9 10*3/uL (ref 3.8–10.8)
WBCMIX: 585 {cells}/uL (ref 200–950)

## 2017-09-19 MED ORDER — AZITHROMYCIN 250 MG PO TABS: ORAL_TABLET | ORAL | 0 refills | Status: DC

## 2017-09-19 MED ORDER — AMOXICILLIN-POT CLAVULANATE 875-125 MG PO TABS: 1.0000 | ORAL_TABLET | Freq: Two times a day (BID) | ORAL | 0 refills | Status: DC

## 2017-09-19 NOTE — Progress Notes (Signed)
Careteam: Patient Care Team: [name], NP as PCP - General (Geriatric Medicine) [name], NP as PCP - Internal Medicine (Geriatric Medicine) Glennie Isle, PA-C as Consulting Physician (Physician Assistant) Leonie Man, MD as Consulting Physician (Cardiology)  Advanced Directive information Does Patient Have a Medical Advance Directive?: Yes, Type of Advance Directive: Healthcare Power of Attorney  Allergies  Allergen Reactions  . Synthroid [Levothyroxine] Hives  . Tylenol [Acetaminophen] Other (See Comments)    Makes my body feel numb     Chief Complaint  Patient presents with  . Medical Management of Chronic Issues    Pt is being seen for a 3 month routine visit. Pt's husband passed away last 24-May-2022. Pt has had a cough with yellowish mucus, SOB for 4 days.   . Other    Daughter in room   . ACP    Pt has DNR and HCPOA- copy requested of HCPOA     HPI: Patient is a 84 y.o. female seen in the office today for routine follow up.  For a few days (over a week) she has had increase chest congestion, wheezing and cough. In the last 2 days has gotten worse. Has use OTC robitussin DM which has been helpful.  Some shortness of breath noted.   Hypothyroid- stopped synthroid after she broke out, was given name brand only- and not having any side effects. She was recently increased to 50 mcg TSH was low 1 month ago, has follow up TSH scheduled.   Weight loss- weight stable at  101.6. Eating a lot since husband was sick and passed away.   Afib- following with cardiologist every 6 months. Taking metoprolol 25 mg by mouth in the morning and 1/2 tablet in the evening. eliquis for anticoagulation.   Hx of t12 compression fracture- s/p vertebroplasty in January which improved pain but had a fall since and pain increased, her husband recently passed and she was doing a lot of work with him.   Hoping pain will improve once she is able to rest.   Review of  Systems:  Review of Systems  Constitutional: Positive for malaise/fatigue. Negative for chills and fever.  HENT: Positive for hearing loss and sore throat. Negative for congestion and sinus pain.   Respiratory: Positive for cough, sputum production, shortness of breath and wheezing.   Cardiovascular: Positive for palpitations. Negative for chest pain.  Gastrointestinal: Negative for abdominal pain, constipation, diarrhea and heartburn.  Genitourinary: Negative for dysuria, frequency and urgency.  Musculoskeletal: Positive for back pain (ongoing. improved with injections). Negative for falls.    Past Medical History:  Diagnosis Date  . Anemia   . Atrial fibrillation, persistent ([place]) 06/2015   Admitted with TIA. Diagnosed with A. fib. Rate control. On Eliquis.  . Compression fracture of T12 vertebra ([place])   . Hypothyroid   . Lyme disease 2017  . Stroke [facility]) 06/2015   -TIA   Past Surgical History:  Procedure Laterality Date  . ABDOMINAL HYSTERECTOMY  1968  . BLADDER SUSPENSION    . IR VERTEBROPLASTY CERV/THOR BX INC UNI/BIL INC/INJECT/IMAGING  03/23/2017  . IR VERTEBROPLASTY EA ADDL (T&LS) BX INC UNI/BIL INC INJECT/IMAGING  03/23/2017  . TRANSTHORACIC ECHOCARDIOGRAM  06/2015   In setting of TIA.  Moderate basal hypertrophy. Vigorous function with EF 65-70%. Mild aortic stenosis with mean gradient 11 mmHg. Severe MAC with mild MR. Moderate LA dilation.   Social History:   reports that she has never smoked. She has  never used smokeless tobacco. She reports that she does not drink alcohol or use drugs.  Family History  Problem Relation Age of Onset  . Heart attack Cousin   . Cancer Mother 55       breast  . Pneumonia Father 75  . Transient ischemic attack Sister   . Atrial fibrillation Brother   . Hypertension Daughter   . Hypertension Son   . Hypertension Son     Medications: Patient's Medications  New Prescriptions   No medications on file  Previous Medications   ELIQUIS  2.5 MG TABS TABLET    TAKE 1 TABLET BY MOUTH TWICE A DAY   LEVOTHYROXINE (SYNTHROID) 50 MCG TABLET    Take 1 tablet (50 mcg total) by mouth daily before breakfast.   MAGNESIUM HYDROXIDE (MILK OF MAGNESIA) 400 MG/5ML SUSPENSION    Take 15 mLs by mouth daily as needed for mild constipation.   METOPROLOL TARTRATE (LOPRESSOR) 25 MG TABLET    TAKE 0.5 TABLETS (12.5 MG TOTAL) BY MOUTH 2 (TWO) TIMES DAILY.  Modified Medications   No medications on file  Discontinued Medications   MECLIZINE (ANTIVERT) 12.5 MG TABLET    Take 1 tablet (12.5 mg total) by mouth 3 (three) times daily as needed for dizziness or nausea.     Physical Exam:  Vitals:   09/19/17 1305  BP: 120/72  Pulse: 69  Temp: 98.6 F (37 C)  TempSrc: Oral  SpO2: 96%  Weight: 101 lb 9.6 oz (46.1 kg)  Height: 5' 5"  (1.651 m)   Body mass index is 16.91 kg/m.  Physical Exam  Constitutional: She is oriented to person, place, and time. No distress.  Frail appearing white female   HENT:  Head: Normocephalic and atraumatic.  Eyes: Pupils are equal, round, and reactive to light. EOM are normal.  Left lower lid ptosis; glasses from many years ago  Neck: Neck supple.  Cardiovascular: Normal rate and intact distal pulses. An irregularly irregular rhythm present.  Murmur heard.  Systolic murmur is present with a grade of 2/6. Pulmonary/Chest: Effort normal. She has rhonchi in the left upper field and the left middle field. She has no rales.  Musculoskeletal: Normal range of motion.  Neurological: She is alert and oriented to person, place, and time.  Skin: Skin is warm and dry.  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    Labs reviewed: Basic Metabolic Panel: Recent Labs    02/24/17 1020 03/09/17 1204 03/23/17 1017 06/06/17 1057 07/25/17 1053  NA 139 133* 136  --   --   K 4.4 4.7 4.0  --   --   CL 101 99* 102  --   --   CO2 27 28 27   --   --   GLUCOSE 97 122*  107*  --   --   BUN 28* 27* 22*  --   --   CREATININE 0.91* 1.06* 1.07*  --   --   CALCIUM 9.5 9.3 9.5  --   --   TSH 6.63*  --   --  9.31* 8.58*   Liver Function Tests: Recent Labs    01/09/17 1053 02/24/17 1020 03/09/17 1204  AST 24 20 23   ALT 16 11 12*  ALKPHOS  --   --  123  BILITOT 0.8 0.6 0.6  PROT 7.9 7.6 7.8  ALBUMIN  --   --  3.8   No results for input(s):  LIPASE, AMYLASE in the last 8760 hours. No results for input(s): AMMONIA in the last 8760 hours. CBC: Recent Labs    01/09/17 1053 02/24/17 1020 03/09/17 1204 03/23/17 1017  WBC 8.0 8.7 8.1 6.9  NEUTROABS 5,176 5,490  --   --   HGB 13.3 12.2 13.8 13.1  HCT 39.0 36.3 41.5 39.8  MCV 90.5 91.7 93.3 95.0  PLT 224 271 280 235   Lipid Panel: No results for input(s): CHOL, HDL, LDLCALC, TRIG, CHOLHDL, LDLDIRECT in the last 8760 hours. TSH: Recent Labs    02/24/17 1020 06/06/17 1057 07/25/17 1053  TSH 6.63* 9.31* 8.58*   A1C: Lab Results  Component Value Date   HGBA1C 6.3 (H) 07/14/2015     Assessment/Plan 1. Acute bronchitis, unspecified organism -increase hydration.  - CBC with Differential/Platelets - CMP - DG Chest 2 View- rule out pneumonia.  - azithromycin (ZITHROMAX) 250 MG tablet; 2 tablets today then 1 tablet daily until complete.  Dispense: 6 tablet; Refill: 0 -mucinex DM by mouth twice daily with full glass of water x 7 days then as needed   2. Acquired hypothyroidism - tolerating synthroid name brand. TSH was increased to 50 mcg after TSH remained elevated. Follow up TSH scheduled.   3. T12 compression fracture ([place]) S/p vertebroplasty in January, was doing well but then had a fall. Her husband passed away last week and hoping pain in back will improve since she will be able to rest more and not be caring for him. Will monitor at this time. To continue exercises (given by PT in the past) may need neurosurgery referral if pain continues  4. Paroxysmal atrial fibrillation (HCC) Rate  controlled, continues on eliquis for anticoagulation and metoprolol for rate - CBC with Differential/Platelets  5. Essential hypertension -stable, continue current regimen.   Next appt: 4 months for routine follow up.  Carlos American. [place], [facility] [phone]

## 2017-09-19 NOTE — Patient Instructions (Addendum)
mucinex DM by mouth twice daily with full glass of water for 7 days then as needed  Azithromycin 500 mg by mouth today then 250 mg by mouth daily until complete  To get chest xray once you leave here   Acute Bronchitis, Adult Acute bronchitis is when air tubes (bronchi) in the lungs suddenly get swollen. The condition can make it hard to breathe. It can also cause these symptoms:  A cough.  Coughing up clear, yellow, or green mucus.  Wheezing.  Chest congestion.  Shortness of breath.  A fever.  Body aches.  Chills.  A sore throat.  Follow these instructions at home: Medicines  Take over-the-counter and prescription medicines only as told by your doctor.  If you were prescribed an antibiotic medicine, take it as told by your doctor. Do not stop taking the antibiotic even if you start to feel better. General instructions  Rest.  Drink enough fluids to keep your pee (urine) clear or pale yellow.  Avoid smoking and secondhand smoke. If you smoke and you need help quitting, ask your doctor. Quitting will help your lungs heal faster.  Use an inhaler, cool mist vaporizer, or humidifier as told by your doctor.  Keep all follow-up visits as told by your doctor. This is important. How is this prevented? To lower your risk of getting this condition again:  Wash your hands often with soap and water. If you cannot use soap and water, use hand sanitizer.  Avoid contact with people who have cold symptoms.  Try not to touch your hands to your mouth, nose, or eyes.  Make sure to get the flu shot every year.  Contact a doctor if:  Your symptoms do not get better in 2 weeks. Get help right away if:  You cough up blood.  You have chest pain.  You have very bad shortness of breath.  You become dehydrated.  You faint (pass out) or keep feeling like you are going to pass out.  You keep throwing up (vomiting).  You have a very bad headache.  Your fever or chills  gets worse. This information is not intended to replace advice given to you by your health care provider. Make sure you discuss any questions you have with your health care provider. Document Released: 08/24/2007 Document Revised: 10/14/2015 Document Reviewed: 08/26/2015 Elsevier Interactive Patient Education  [employer]2018 [employer]

## 2017-09-20 ENCOUNTER — Telehealth: Payer: Self-pay

## 2017-09-20 NOTE — Telephone Encounter (Signed)
I spoke with patient's daughter and she verbalized understanding.  

## 2017-09-20 NOTE — Telephone Encounter (Signed)
Per Amy- patient has been scheduled for a chest CT without contrast on Tuesday October 03, 2017.   Pt is to arrive at 2:40 for appointment time of 3 pm. [address]

## 2017-09-20 NOTE — Telephone Encounter (Signed)
Patient's daughter returned your call. [phone]

## 2017-09-27 ENCOUNTER — Other Ambulatory Visit: Payer: Self-pay | Admitting: Nurse Practitioner

## 2017-09-29 ENCOUNTER — Other Ambulatory Visit: Payer: Self-pay

## 2017-09-29 DIAGNOSIS — E039 Hypothyroidism, unspecified: Secondary | ICD-10-CM

## 2017-10-02 ENCOUNTER — Other Ambulatory Visit: Payer: Medicare HMO

## 2017-10-03 ENCOUNTER — Ambulatory Visit
Admission: RE | Admit: 2017-10-03 | Discharge: 2017-10-03 | Disposition: A | Discharge status: Home / Self Care | Payer: Medicare HMO | Source: Ambulatory Visit | Attending: Nurse Practitioner | Admitting: Nurse Practitioner

## 2017-10-03 DIAGNOSIS — R9389 Abnormal findings on diagnostic imaging of other specified body structures: Secondary | ICD-10-CM

## 2017-10-04 ENCOUNTER — Other Ambulatory Visit: Payer: Self-pay

## 2017-10-04 DIAGNOSIS — R918 Other nonspecific abnormal finding of lung field: Secondary | ICD-10-CM

## 2017-10-16 ENCOUNTER — Encounter: Payer: Self-pay | Admitting: Internal Medicine

## 2017-10-16 ENCOUNTER — Ambulatory Visit (INDEPENDENT_AMBULATORY_CARE_PROVIDER_SITE_OTHER): Payer: Medicare HMO | Admitting: Internal Medicine

## 2017-10-16 VITALS — BP 130/80 | HR 92 | Temp 97.7°F | Ht 65.0 in | Wt 95.0 lb

## 2017-10-16 DIAGNOSIS — R54 Age-related physical debility: Secondary | ICD-10-CM | POA: Diagnosis not present

## 2017-10-16 DIAGNOSIS — R42 Dizziness and giddiness: Secondary | ICD-10-CM | POA: Diagnosis not present

## 2017-10-16 DIAGNOSIS — R634 Abnormal weight loss: Secondary | ICD-10-CM

## 2017-10-16 DIAGNOSIS — J984 Other disorders of lung: Secondary | ICD-10-CM

## 2017-10-16 LAB — CBC WITH DIFFERENTIAL/PLATELET
Basophils Absolute: 58 cells/uL (ref 0–200)
Basophils Relative: 0.8 %
Eosinophils Absolute: 117 cells/uL (ref 15–500)
Eosinophils Relative: 1.6 %
HCT: 36.5 % (ref 35.0–45.0)
Hemoglobin: 12.4 g/dL (ref 11.7–15.5)
Lymphs Abs: 1759 cells/uL (ref 850–3900)
MCH: 31.4 pg (ref 27.0–33.0)
MCHC: 34 g/dL (ref 32.0–36.0)
MCV: 92.4 fL (ref 80.0–100.0)
MPV: 9.1 fL (ref 7.5–12.5)
Monocytes Relative: 5.9 %
Neutro Abs: 4935 cells/uL (ref 1500–7800)
Neutrophils Relative %: 67.6 %
Platelets: 236 10*3/uL (ref 140–400)
RBC: 3.95 10*6/uL (ref 3.80–5.10)
RDW: 13.5 % (ref 11.0–15.0)
Total Lymphocyte: 24.1 %
WBC mixed population: 431 cells/uL (ref 200–950)
WBC: 7.3 10*3/uL (ref 3.8–10.8)

## 2017-10-16 LAB — COMPLETE METABOLIC PANEL WITH GFR
AG Ratio: 1.3 (calc) (ref 1.0–2.5)
ALT: 14 U/L (ref 6–29)
AST: 27 U/L (ref 10–35)
Albumin: 4.2 g/dL (ref 3.6–5.1)
Alkaline phosphatase (APISO): 57 U/L (ref 33–130)
BUN: 22 mg/dL (ref 7–25)
CO2: 23 mmol/L (ref 20–32)
Calcium: 9.3 mg/dL (ref 8.6–10.4)
Chloride: 98 mmol/L (ref 98–110)
Creat: 0.87 mg/dL (ref 0.60–0.88)
GFR, Est African American: 67 mL/min/{1.73_m2} (ref >=60)
GFR, Est Non African American: 58 mL/min/{1.73_m2} — ABNORMAL LOW (ref >=60)
Globulin: 3.3 g/dL (calc) (ref 1.9–3.7)
Glucose, Bld: 97 mg/dL (ref 65–99)
Potassium: 4.9 mmol/L (ref 3.5–5.3)
Sodium: 133 mmol/L — ABNORMAL LOW (ref 135–146)
Total Bilirubin: 0.9 mg/dL (ref 0.2–1.2)
Total Protein: 7.5 g/dL (ref 6.1–8.1)

## 2017-10-16 LAB — TSH: TSH: 5.22 mIU/L — ABNORMAL HIGH (ref 0.40–4.50)

## 2017-10-16 MED ORDER — MECLIZINE HCL 12.5 MG PO TABS: 12.5000 mg | ORAL_TABLET | Freq: Three times a day (TID) | ORAL | 0 refills | Status: DC | PRN

## 2017-10-16 NOTE — Progress Notes (Signed)
Location:  [facility] clinic Provider: Al Gagen L. Mariea Clonts, D.O., C.M.D.  Code Status: DNR as previously discussed with her PCP Goals of Care:  Advanced Directives 09/19/2017  Does Patient Have a Medical Advance Directive? Yes  Type of Advance Directive [place]  Does patient want to make changes to medical advance directive? -  Copy of [place] in Chart? No - copy requested  Would patient like information on creating a medical advance directive? -  Pre-existing out of facility DNR order (yellow form or pink MOST form) -     Chief Complaint  Patient presents with  . Acute Visit    dizzy x1 day    HPI: Patient is a 84 y.o. female pt of NP Eubanks with h/o recent bronchitis and pneumonia (cavitary lesion RUL with pulmonary visit upcoming), afib on eliquis, prior stroke, memory loss, aortic stenosis (mild on her echo 2017, but very audible and high-pitched currently), hypothyroidism seen today for an acute visit for two weak spells.  First occurred at 4pm yesterday, second this morning.  She'd been outside sitting on the porch.  Thought she better come in the house.  Sat right down on the bed.  She got very dizzy and did not feel like getting up.  It's been off and on ever since. Feels a little dizzy at the moment.  Coming and going.  Feels lightheaded like she'd pass out, but does feel kind of like the room was spinning.  Has had vertigo before.  She did not get up all night.  She wore a depends so she would not have to get up.  Worse when she moves her head.  Feels it across her eyes.  No tinnitus.  No visual changes.  She felt just a little warm outside.  She was sitting in the shade on the porch.  Says vision is a little fuzzy.  Has had some palpitations and a little shortness of breath.  She is to see a pulmonary specialist Wednesday.  She has been eating and drinking, but her daughter says not as much as she should be.  She previously had [place]  spotted fever, then tick fever.  Weight has fluctuated up and down.  Was married 78 years and her husband just passed a month ago.  She also had the bronchitis and pneumonia.      Past Medical History:  Diagnosis Date  . Anemia   . Atrial fibrillation, persistent ([place]) 06/2015   Admitted with TIA. Diagnosed with A. fib. Rate control. On Eliquis.  . Compression fracture of T12 vertebra ([place])   . Hypothyroid   . Lyme disease 2017  . Stroke [facility]) 06/2015   -TIA    Past Surgical History:  Procedure Laterality Date  . ABDOMINAL HYSTERECTOMY  1968  . BLADDER SUSPENSION    . IR VERTEBROPLASTY CERV/THOR BX INC UNI/BIL INC/INJECT/IMAGING  03/23/2017  . IR VERTEBROPLASTY EA ADDL (T&LS) BX INC UNI/BIL INC INJECT/IMAGING  03/23/2017  . TRANSTHORACIC ECHOCARDIOGRAM  06/2015   In setting of TIA.  Moderate basal hypertrophy. Vigorous function with EF 65-70%. Mild aortic stenosis with mean gradient 11 mmHg. Severe MAC with mild MR. Moderate LA dilation.    Allergies  Allergen Reactions  . Synthroid [Levothyroxine] Hives  . Tylenol [Acetaminophen] Other (See Comments)    Makes my body feel numb     Outpatient Encounter Medications as of 10/16/2017  Medication Sig  . ELIQUIS 2.5 MG TABS tablet TAKE 1 TABLET BY  MOUTH TWICE A DAY  . levothyroxine (SYNTHROID) 50 MCG tablet Take 1 tablet (50 mcg total) by mouth daily before breakfast.  . magnesium hydroxide (MILK OF MAGNESIA) 400 MG/5ML suspension Take 15 mLs by mouth daily as needed for mild constipation.  . metoprolol tartrate (LOPRESSOR) 25 MG tablet TAKE 0.5 TABLETS (12.5 MG TOTAL) BY MOUTH 2 (TWO) TIMES DAILY.  . [DISCONTINUED] amoxicillin-clavulanate (AUGMENTIN) 875-125 MG tablet Take 1 tablet by mouth 2 (two) times daily.  . [DISCONTINUED] azithromycin (ZITHROMAX) 250 MG tablet 2 tablets today then 1 tablet daily until complete.   No facility-administered encounter medications on file as of 10/16/2017.     Review of Systems:  Review of  Systems  Constitutional: Positive for malaise/fatigue and weight loss. Negative for chills, diaphoresis and fever.  HENT: Negative for congestion and tinnitus.   Eyes: Negative for blurred vision.  Respiratory: Positive for shortness of breath. Negative for cough and sputum production.        Sats in 70s when she first arrived, but went up to 95% at rest  Cardiovascular: Positive for palpitations. Negative for chest pain, orthopnea and leg swelling.  Gastrointestinal: Negative for abdominal pain.  Genitourinary: Negative for dysuria.  Musculoskeletal: Positive for falls.       Last was several weeks ago  Neurological: Positive for dizziness and weakness. Negative for loss of consciousness and headaches.  Psychiatric/Behavioral: Positive for memory loss.    Health Maintenance  Topic Date Due  . TETANUS/TDAP  08/16/1942  . DEXA SCAN  08/17/1990  . INFLUENZA VACCINE  10/19/2017  . PNA vac Low Risk Adult (2 of 2 - PPSV23) 02/15/2018    Physical Exam: Vitals:   10/16/17 1140  BP: 130/80  Pulse: 92  Temp: 97.7 F (36.5 C)  TempSrc: Oral  SpO2: 95%  Weight: 95 lb (43.1 kg)  Height: 5' 5"  (1.651 m)   Body mass index is 15.81 kg/m. Physical Exam  Constitutional:  Frail, cachectic female, ambulating with rolling walker with skis  Cardiovascular:  Murmur heard. High pitched murmur audible on right second ICS and radiating to carotid, but also notable at apex  Pulmonary/Chest: Effort normal and breath sounds normal.  Abdominal: Bowel sounds are normal.  Musculoskeletal: Normal range of motion.  Seemed to walk to the left when going through the doorway; dizziness came on with positional change (lying to sitting and persisted while sitting up and while walking), no nystagmus seen  Neurological: She is alert. No cranial nerve deficit.  Skin: Skin is warm and dry. Capillary refill takes less than 2 seconds. There is pallor.  Ecchymoses of arms and left leg from prior fall    Psychiatric: She has a normal mood and affect.    Labs reviewed: Basic Metabolic Panel: Recent Labs    02/24/17 1020 03/09/17 1204 03/23/17 1017 06/06/17 1057 07/25/17 1053 09/19/17 1355  NA 139 133* 136  --   --  135  K 4.4 4.7 4.0  --   --  4.5  CL 101 99* 102  --   --  98  CO2 27 28 27   --   --  29  GLUCOSE 97 122* 107*  --   --  99  BUN 28* 27* 22*  --   --  25  CREATININE 0.91* 1.06* 1.07*  --   --  0.91*  CALCIUM 9.5 9.3 9.5  --   --  9.2  TSH 6.63*  --   --  9.31* 8.58*  --  Liver Function Tests: Recent Labs    02/24/17 1020 03/09/17 1204 09/19/17 1355  AST 20 23 28   ALT 11 12* 23  ALKPHOS  --  123  --   BILITOT 0.6 0.6 0.8  PROT 7.6 7.8 7.2  ALBUMIN  --  3.8  --    No results for input(s): LIPASE, AMYLASE in the last 8760 hours. No results for input(s): AMMONIA in the last 8760 hours. CBC: Recent Labs    01/09/17 1053 02/24/17 1020 03/09/17 1204 03/23/17 1017 09/19/17 1355  WBC 8.0 8.7 8.1 6.9 7.9  NEUTROABS 5,176 5,490  --   --  4,945  HGB 13.3 12.2 13.8 13.1 12.3  HCT 39.0 36.3 41.5 39.8 36.7  MCV 90.5 91.7 93.3 95.0 93.1  PLT 224 271 280 235 226   Lipid Panel: No results for input(s): CHOL, HDL, LDLCALC, TRIG, CHOLHDL, LDLDIRECT in the last 8760 hours. Lab Results  Component Value Date   HGBA1C 6.3 (H) 07/14/2015    Procedures since last visit: Dg Chest 2 View  Result Date: 09/19/2017 CLINICAL DATA:  Cough, low chest pain, history of atrial fibrillation EXAM: CHEST - 2 VIEW COMPARISON:  None. FINDINGS: The lungs are hyperaerated consistent with emphysema. However, there is parenchymal opacity in the right upper lobe toward the apex. Some of this opacity of appears represent scarring, but and active component is suspected with air-fluid level in the posterior aspect of the right upper lobe indicating infection. This could represent carrier cavitary pneumonia, or infected bullous, and CT of the chest would be helpful to assess further.  Mediastinal and hilar contours otherwise are unremarkable and mild cardiomegaly is stable. Mitral annulus calcification again is noted. The bones are diffusely osteopenic. Vertebroplasty is present in the region of L1 and L4 vertebral bodies. IMPRESSION: 1. New opacity within the right upper lobe most consistent with active pneumonia. 2. Air-fluid level within a portion of this opacity may represent cavitary pneumonia or infected bleb with surrounding pneumonia. Consider CT of the chest to assess further. Electronically Signed   By: Ivar Drape M.D.   On: 09/19/2017 15:44   Ct Chest Wo Contrast  Result Date: 10/04/2017 CLINICAL DATA:  Abnormal chest x-ray 2 weeks ago, pneumonia, post antibiotic therapy. EXAM: CT CHEST WITHOUT CONTRAST TECHNIQUE: Multidetector CT imaging of the chest was performed following the standard protocol without IV contrast. COMPARISON:  Chest radiograph 09/19/2017 and CT chest 07/15/2015. FINDINGS: Cardiovascular: Atherosclerotic calcification of the arterial vasculature, including three-vessel involvement of the coronary arteries and aortic valve. Heart is enlarged. No pericardial effusion. Mediastinum/Nodes: Mediastinal lymph nodes measure up to 10 mm in the low right paratracheal station. Hilar regions are difficult to evaluate without IV contrast. No axillary adenopathy. Esophagus is grossly unremarkable. Lungs/Pleura: A somewhat thick-walled cavitary lesion in the posterior right upper lobe measures 2.6 x 2.7 cm and may represent enlargement of a cavitary lesion seen in the same location on 07/15/2015, measuring 1.7 x 1.8 cm at that time. Surrounding volume loss and consolidation in the posterior segment right upper lobe. Moderate right pleural effusion. Small left pleural effusion. Airway is unremarkable. Upper Abdomen: Visualized portions of the liver and right adrenal gland are unremarkable. There may be a 1.9 cm left adrenal adenoma, similar to 07/15/2015. Stones are seen in the  kidneys bilaterally. Visualized portions of the spleen and stomach are unremarkable. Musculoskeletal: Degenerative changes in the spine. T12 vertebral body augmentation. IMPRESSION: 1. Somewhat thick-walled cavitary lesion in the posterior segment right upper lobe, at the  site of previously seen smaller thick-walled cavitary lesion in the same location on 07/15/2015, raising concern for primary bronchogenic carcinoma. Abscess with surrounding pneumonia is not excluded. 2. Moderate right, and small left, pleural effusions. 3. Aortic atherosclerosis (ICD10-170.0). Three-vessel coronary artery calcification. 4. Possible left adrenal adenoma. 5. Bilateral renal stones. Electronically Signed   By: Lorin Picket M.D.   On: 10/04/2017 10:16    Assessment/Plan 1. Vertigo -she previously had BPPV that responded to therapy, but I'm not certain she has this this time -will try meclizine, if not responding may do home health PT - ? Role of hypoxia from lung mass or possibly metastatic disease--hopefully not -also r/o increased anemia, dehydration, and recheck thyroid - meclizine (ANTIVERT) 12.5 MG tablet; Take 1 tablet (12.5 mg total) by mouth 3 (three) times daily as needed.  Dispense: 30 tablet; Refill: 0 - CBC with Differential/Platelet - COMPLETE METABOLIC PANEL WITH GFR - TSH  2. Pulmonary cavitary lesion - newly identified after bronchitis and pneumonia -sees pulmonary wed--radiologist was concerned that this was possibly bronchogenic carcinoma vs abscess, but only remaining symptom is dypsnea - sats had dropped when she walked in here - CBC with Differential/Platelet - COMPLETE METABOLIC PANEL WITH GFR  3. Weight loss - had gotten up over 100, but now dropped again - CBC with Differential/Platelet - COMPLETE METABOLIC PANEL WITH GFR - TSH  4. Frailty syndrome in geriatric patient - ongoing, given more ensure samples today - CBC with Differential/Platelet - COMPLETE METABOLIC PANEL WITH  GFR - TSH  Labs/tests ordered:   Orders Placed This Encounter  Procedures  . CBC with Differential/Platelet  . COMPLETE METABOLIC PANEL WITH GFR  . TSH   Next appt:  01/23/2018--keep regular visit  Noni Stonesifer L. Crissa Sowder, D.O. [facility] [address], [place] [address] Cell Phone (Mon-Fri 8am-5pm):  [phone] On Call:  [phone] & follow prompts after 5pm & weekends Office Phone:  [phone] Office Fax:  [phone]

## 2017-10-16 NOTE — Patient Instructions (Addendum)
Please keep your appt with the lung specialist.   Try meclizine for your dizziness which seems like vertigo Drink plenty of water and eat regularly. Take ensure or boost supplements to improve your nutrition.

## 2017-10-18 ENCOUNTER — Institutional Professional Consult (permissible substitution): Payer: Medicare HMO | Admitting: Pulmonary Disease

## 2017-10-19 ENCOUNTER — Encounter: Payer: Self-pay | Admitting: *Deleted

## 2017-10-31 ENCOUNTER — Ambulatory Visit (HOSPITAL_COMMUNITY)
Admission: RE | Admit: 2017-10-31 | Discharge: 2017-10-31 | Disposition: A | Discharge status: Home / Self Care | Payer: Medicare HMO | Source: Ambulatory Visit | Attending: Physician Assistant | Admitting: Physician Assistant

## 2017-10-31 ENCOUNTER — Ambulatory Visit: Payer: Medicare HMO | Admitting: Pulmonary Disease

## 2017-10-31 ENCOUNTER — Encounter: Payer: Self-pay | Admitting: Pulmonary Disease

## 2017-10-31 ENCOUNTER — Ambulatory Visit (HOSPITAL_COMMUNITY)
Admission: RE | Admit: 2017-10-31 | Discharge: 2017-10-31 | Disposition: A | Discharge status: Home / Self Care | Payer: Medicare HMO | Source: Ambulatory Visit | Attending: Pulmonary Disease | Admitting: Pulmonary Disease

## 2017-10-31 ENCOUNTER — Other Ambulatory Visit: Payer: Self-pay | Admitting: Pulmonary Disease

## 2017-10-31 VITALS — BP 120/70 | HR 90 | Ht 65.0 in | Wt 100.0 lb

## 2017-10-31 DIAGNOSIS — J90 Pleural effusion, not elsewhere classified: Secondary | ICD-10-CM

## 2017-10-31 DIAGNOSIS — R9389 Abnormal findings on diagnostic imaging of other specified body structures: Secondary | ICD-10-CM | POA: Diagnosis not present

## 2017-10-31 DIAGNOSIS — Z8701 Personal history of pneumonia (recurrent): Secondary | ICD-10-CM | POA: Diagnosis not present

## 2017-10-31 HISTORY — PX: IR THORACENTESIS ASP PLEURAL SPACE W/IMG GUIDE: IMG5380

## 2017-10-31 LAB — BODY FLUID CELL COUNT WITH DIFFERENTIAL
EOS FL: 1 %
Lymphs, Fluid: 54 %
MONOCYTE-MACROPHAGE-SEROUS FLUID: 39 % — AB (ref 50–90)
Neutrophil Count, Fluid: 6 % (ref 0–25)
Total Nucleated Cell Count, Fluid: 1380 cu mm — ABNORMAL HIGH (ref 0–1000)

## 2017-10-31 LAB — GLUCOSE, PLEURAL OR PERITONEAL FLUID: Glucose, Fluid: 119 mg/dL

## 2017-10-31 LAB — PROTEIN, PLEURAL OR PERITONEAL FLUID: Total protein, fluid: <3 g/dL

## 2017-10-31 LAB — GRAM STAIN

## 2017-10-31 LAB — LACTATE DEHYDROGENASE, PLEURAL OR PERITONEAL FLUID: LD FL: 71 U/L — AB (ref 3–23)

## 2017-10-31 LAB — ALBUMIN, PLEURAL OR PERITONEAL FLUID: ALBUMIN FL: 1.7 g/dL

## 2017-10-31 MED ORDER — LIDOCAINE HCL (PF) 2 % IJ SOLN
INTRAMUSCULAR | Status: AC
  Filled 2017-10-31: qty 20

## 2017-10-31 MED ORDER — LIDOCAINE HCL (PF) 2 % IJ SOLN
INTRAMUSCULAR | Status: DC | PRN
  Administered 2017-10-31: 10 mL

## 2017-10-31 NOTE — Procedures (Signed)
PROCEDURE SUMMARY:  Successful image-guided right thoracentesis. Yielded 500 milliliters of yellow fluid. Patient tolerated procedure well. No immediate complications.  Specimen was sent for labs. CXR ordered.  Villa HerbShannon A Keryl Gholson PA-C 10/31/2017 2:12 PM

## 2017-10-31 NOTE — Progress Notes (Signed)
[place]    [id number]    [birth year]  Primary Care Physician:Eubanks, Carlos American, NP  Referring Physician: [name], NP [place], [place] [address]  Chief complaint:   Cough, shortness of breath  HPI:  Was recently treated for pneumonia end of June Recently lost her spouse of many years She was treated with a course of antibiotics She is feeling bad better Multiple comorbidities She is not really coughing up significant amounts of phlegm, no hemoptysis Weight has been stable recently, though did lose some weight over the last couple of years-felt related to her thyroid Has had a fall recently Issues with chronic back pain Diagnosis of Lyme's in the last couple years  Occupation:office work Exposures: No significant mold exposure Smoking history: Never smoker, was exposed to secondhand smoke  Outpatient Encounter Medications as of 10/31/2017  Medication Sig  . ELIQUIS 2.5 MG TABS tablet TAKE 1 TABLET BY MOUTH TWICE A DAY  . levothyroxine (SYNTHROID) 50 MCG tablet Take 1 tablet (50 mcg total) by mouth daily before breakfast.  . magnesium hydroxide (MILK OF MAGNESIA) 400 MG/5ML suspension Take 15 mLs by mouth daily as needed for mild constipation.  . meclizine (ANTIVERT) 12.5 MG tablet Take 1 tablet (12.5 mg total) by mouth 3 (three) times daily as needed.  . metoprolol tartrate (LOPRESSOR) 25 MG tablet TAKE 0.5 TABLETS (12.5 MG TOTAL) BY MOUTH 2 (TWO) TIMES DAILY.   No facility-administered encounter medications on file as of 10/31/2017.     Allergies as of 10/31/2017 - Review Complete 10/31/2017  Allergen Reaction Noted  . Synthroid [levothyroxine] Hives 06/08/2017  . Tylenol [acetaminophen] Other (See Comments) 08/25/2014    Past Medical History:  Diagnosis Date  . Anemia   . Atrial fibrillation, persistent ([place]) 06/2015   Admitted with TIA. Diagnosed with A. fib. Rate control. On Eliquis.  . Compression fracture of T12  vertebra ([place])   . Hypothyroid   . Lyme disease 2017  . Stroke [facility]) 06/2015   -TIA    Past Surgical History:  Procedure Laterality Date  . ABDOMINAL HYSTERECTOMY  1968  . BLADDER SUSPENSION    . IR VERTEBROPLASTY CERV/THOR BX INC UNI/BIL INC/INJECT/IMAGING  03/23/2017  . IR VERTEBROPLASTY EA ADDL (T&LS) BX INC UNI/BIL INC INJECT/IMAGING  03/23/2017  . TRANSTHORACIC ECHOCARDIOGRAM  06/2015   In setting of TIA.  Moderate basal hypertrophy. Vigorous function with EF 65-70%. Mild aortic stenosis with mean gradient 11 mmHg. Severe MAC with mild MR. Moderate LA dilation.    Family History  Problem Relation Age of Onset  . Heart attack Cousin   . Cancer Mother 58       breast  . Pneumonia Father 67  . Transient ischemic attack Sister   . Atrial fibrillation Brother   . Hypertension Daughter   . Hypertension Son   . Hypertension Son     Social History   Socioeconomic History  . Marital status: Married    Spouse name: Not on file  . Number of children: Not on file  . Years of education: Not on file  . Highest education level: Not on file  Occupational History  . Not on file  Social Needs  . Financial resource strain: Not on file  . Food insecurity:    Worry: Never true    Inability: Never true  . Transportation needs:    Medical: No    Non-medical: No  Tobacco Use  . Smoking  status: Never Smoker  . Smokeless tobacco: Never Used  Substance and Sexual Activity  . Alcohol use: No  . Drug use: No  . Sexual activity: Never  Lifestyle  . Physical activity:    Days per week: 0 days    Minutes per session: 0 min  . Stress: To some extent  Relationships  . Social connections:    Talks on phone: More than three times a week    Gets together: Never    Attends religious service: Never    Active member of club or organization: No    Attends meetings of clubs or organizations: Never    Relationship status: Married  . Intimate partner violence:    Fear of current or ex  partner: No    Emotionally abused: No    Physically abused: No    Forced sexual activity: No  Other Topics Concern  . Not on file  Social History Narrative   Social History      Diet?       Do you drink/eat things with caffeine? coffee      Marital status?       married                             What year were you married? 1947      Do you live in a house, apartment, assisted living, condo, trailer, etc.? house      Is it one or more stories? Yes-but does not go upstairs      How many persons live in your home? 3      Do you have any pets in your home? (please list) 1 dog      Highest level of education completed?      Current or past profession: Clinical cytogeneticist      Do you exercise?                                      Type & how often?      Advanced Directives      Do you have a living will? no      Do you have a DNR form?     no                             If not, do you want to discuss one? yes      Do you have signed POA/HPOA for forms?       Functional Status      Do you have difficulty bathing or dressing yourself?      Do you have difficulty preparing food or eating?       Do you have difficulty managing your medications?      Do you have difficulty managing your finances?      Do you have difficulty affording your medications?    Review of systems: Review of Systems  Constitutional: Negative for fever and chills.  HENT: Negative.   Eyes: Negative for blurred vision.  Respiratory: as per HPI  Cardiovascular: Negative for chest pain and palpitations.  Gastrointestinal: Negative for vomiting, diarrhea, blood per rectum. Genitourinary: Negative for dysuria, urgency, frequency and hematuria.  Musculoskeletal: Negative for myalgias, back pain and joint pain.  Skin: Negative for itching and rash.  Neurological: Negative for dizziness,  tremors, focal weakness, seizures and loss of consciousness.  Endo/Heme/Allergies: Negative for environmental allergies.    Psychiatric/Behavioral: Negative for depression, suicidal ideas and hallucinations.  All other systems reviewed and are negative.  Physical Exam:  Vitals:   10/31/17 1105  BP: 120/70  Pulse: 90  SpO2: 100%   Gen:      No acute distress, frail HEENT:  EOMI, sclera anicteric Neck:     No masses; no thyromegaly Lungs:    Decreased air entry bibasilarly ; normal respiratory effort CV:         Regular rate and rhythm; no murmurs Abd:      + bowel sounds; soft, non-tender; no palpable masses, no distension Ext:    No edema; adequate peripheral perfusion Skin:      Warm and dry; no rash Neuro: alert and oriented x 3 Psych: normal mood and affect  Data Reviewed: .  CT scan of the chest was reviewed by myself-reviewed with the patient and her daughter  .  Records reviewed  .  Previous chest x-ray reviewed  Assessment:  .  Abnormal CT scan of the chest showing a cavitary right upper lobe nodule  .  Recent pneumonia  .  Concerns included non-resolving pneumonia, fungal infection, neoplastic process  .  Patient is very frail  Plan/Recommendations:  .  Options interval of intervention discussed with the patient and her daughter  .  We agreed upon ultrasound-guided thoracentesis and send fluid for analysis  .  Bronchoscopy was discussed-risk is very high, very frail  .  The possibility of neoplastic process was discussed A poorly resolved pneumonic process is an option, a chest x-ray performed in November 2018 did not show an infiltrative process in this area  She did agree with an ultrasound-guided thoracentesis and send fluid for analysis, depending on findings we will discuss further interventions that she is open to  Tentatively will bring her back to the office in about 4 to 6 weeks    Sherrilyn Rist MD [facility] 10/31/2017, 12:59 PM  CC: [name], NP

## 2017-10-31 NOTE — Patient Instructions (Signed)
.    Cavitary right upper lobe nodule  .  Recent pneumonia  .  Possibility of a neoplastic process/ unresolving infection/fungal infection  .  We will schedule you for ultrasound guided thoracentesis-have some fluid taken off the fluid on the right side of the lung to be sent for analysis  .  I will see you back in the office in 4 to 6 weeks  .  We will repeat a CT scan of the chest at that time  .  We will update you with the fluid results as they become available

## 2017-11-01 ENCOUNTER — Other Ambulatory Visit: Payer: Self-pay | Admitting: Nurse Practitioner

## 2017-11-01 LAB — PH, BODY FLUID: pH, Body Fluid: 7.6

## 2017-11-01 LAB — AMYLASE, PLEURAL OR PERITONEAL FLUID: Amylase, Fluid: 44 U/L

## 2017-11-03 LAB — CHOLESTEROL, BODY FLUID: Cholesterol, Fluid: 33 mg/dL

## 2017-11-05 LAB — CULTURE, BODY FLUID-BOTTLE: CULTURE: NO GROWTH

## 2017-11-05 LAB — CULTURE, BODY FLUID W GRAM STAIN -BOTTLE

## 2017-11-06 ENCOUNTER — Telehealth: Payer: Self-pay | Admitting: Pulmonary Disease

## 2017-11-06 NOTE — Telephone Encounter (Signed)
Called and spoke to pt's daughter, Terri (DPRCamelia Eng). Camelia Engerri is requesting pt's thoracentesis results from 10/31/17.  AO please advise. Thanks

## 2017-11-07 NOTE — Telephone Encounter (Signed)
Pt's daughter, Camelia Engerri is aware of below message and voiced her understanding. Nothing further is needed.

## 2017-11-16 ENCOUNTER — Other Ambulatory Visit: Payer: Self-pay | Admitting: Nurse Practitioner

## 2017-11-16 MED ORDER — LEVOTHYROXINE SODIUM 50 MCG PO TABS: ORAL_TABLET | ORAL | 1 refills | Status: DC

## 2017-11-16 NOTE — Addendum Note (Signed)
Addended by: Chriss DriverLANE, TERRY L on: 11/16/2017 08:33 AM   Modules accepted: Orders

## 2017-11-21 ENCOUNTER — Ambulatory Visit (INDEPENDENT_AMBULATORY_CARE_PROVIDER_SITE_OTHER): Payer: Medicare HMO | Admitting: Cardiology

## 2017-11-21 ENCOUNTER — Encounter: Payer: Self-pay | Admitting: Cardiology

## 2017-11-21 VITALS — BP 146/74 | HR 85 | Ht 65.0 in | Wt 101.0 lb

## 2017-11-21 DIAGNOSIS — I4819 Other persistent atrial fibrillation: Secondary | ICD-10-CM

## 2017-11-21 DIAGNOSIS — Z79899 Other long term (current) drug therapy: Secondary | ICD-10-CM

## 2017-11-21 DIAGNOSIS — I481 Persistent atrial fibrillation: Secondary | ICD-10-CM

## 2017-11-21 DIAGNOSIS — R0601 Orthopnea: Secondary | ICD-10-CM | POA: Diagnosis not present

## 2017-11-21 DIAGNOSIS — I10 Essential (primary) hypertension: Secondary | ICD-10-CM

## 2017-11-21 DIAGNOSIS — I350 Nonrheumatic aortic (valve) stenosis: Secondary | ICD-10-CM | POA: Diagnosis not present

## 2017-11-21 MED ORDER — FUROSEMIDE 20 MG PO TABS: ORAL_TABLET | ORAL | 6 refills | Status: DC

## 2017-11-21 NOTE — Progress Notes (Signed)
PCP: [name], NP  Clinic Note: Chief Complaint  Patient presents with  . Follow-up    some chest wall soreness  . Shortness of Breath    With exertion  . Edema    HPI: Caitlyn Henry is a 84 y.o. female with a PMH below who presents today for 39-monthfollow-up for persistent A. fib/permanent A. fib. [facility] with weakness and decreased responsiveness.  She carries a diagnosis of Lyme disease that has been treated but still she has symptoms.  When she presented with TIA symptoms, she was in A. fib. The working diagnosis was that she had an embolic TIA related to her A. fib. She was relatively unaware of being in A. fib at that time  [place] last seen on May 16, 2017.  She was doing fairly well.  Was dealing with some back pain from a fall that it happened earlier that month.  Otherwise doing fine.  No real chest pain or dyspnea.  Recent Hospitalizations:   Not hospitalized, but outpatient thoracentesis  Studies Personally Reviewed - (if available, images/films reviewed: From Epic Chart or Care Everywhere)  None  Interval History: Since I last saw [place] she is doing fairly well.  She says every now and then she can feel her heart rate going up fast usually when she is upset or try to do something that she is frustrated about.  Also when she gets upset about things happening around her and her inability to do things, she notes her heart rate going fast and then she notes some discomfort. She notices exertional dyspnea as well as occasional PND orthopnea with edema seems to be worse than usual.  She has some unsteady balance and has dizziness where she can sometimes feel a second pass out when she first sits up, but that goes away usually.  She has had several falls in the past, but none since I last saw her. She is somewhat subdued today because her husband passed away back in J07/23/2024on the 26th.  She then was  hospitalized for pneumonia with a pleural effusion requiring thoracentesis.  + DOE, occ PND/orthopnea & edema  Rare sensation of increased HR - when excited or hurrying. --NO CP.   She has not had any bleeding issues of melena, hematochezia, hematuria, epistaxis or significant bruising.   No TIA or amaurosis fugax symptoms.  No syncope or near syncope type symptoms.  No claudication.  ROS: A comprehensive was performed. Review of Systems  HENT: Negative for congestion and nosebleeds.   Eyes: Negative.   Respiratory: Negative for cough, shortness of breath and wheezing.   Cardiovascular: Positive for chest pain (Soreness from falls) and palpitations (Per HPI. Nothing to suggest awareness of A. fib.).  Gastrointestinal: Positive for constipation. Negative for abdominal pain, blood in stool, heartburn and melena.  Genitourinary: Negative for frequency and hematuria.  Musculoskeletal: Positive for back pain (Related to her recent fall with spine fracture).  Neurological: Positive for dizziness (But not recently) and weakness (Global). Negative for focal weakness and headaches.  Psychiatric/Behavioral: Positive for memory loss. The patient is nervous/anxious.   All other systems reviewed and are negative.  I have reviewed and (if needed) personally updated the patient's problem list, medications, allergies, past medical and surgical history, social and family history.   Past Medical History:  Diagnosis Date  . Anemia   . Atrial fibrillation, persistent ([facility] 06/2015   Admitted with TIA. Diagnosed with  A. fib. Rate control. On Eliquis.  . Compression fracture of T12 vertebra ([place])   . Hypothyroid   . Lyme disease 2017  . Stroke [facility]) 06/2015   -TIA    Past Surgical History:  Procedure Laterality Date  . ABDOMINAL HYSTERECTOMY  1968  . BLADDER SUSPENSION    . IR THORACENTESIS ASP PLEURAL SPACE W/IMG GUIDE  10/31/2017  . IR VERTEBROPLASTY CERV/THOR BX INC UNI/BIL INC/INJECT/IMAGING   03/23/2017  . IR VERTEBROPLASTY EA ADDL (T&LS) BX INC UNI/BIL INC INJECT/IMAGING  03/23/2017  . TRANSTHORACIC ECHOCARDIOGRAM  06/2015   In setting of TIA.  Moderate basal hypertrophy. Vigorous function with EF 65-70%. Mild aortic stenosis with mean gradient 11 mmHg. Severe MAC with mild MR. Moderate LA dilation.    Current Meds  Medication Sig  . ELIQUIS 2.5 MG TABS tablet TAKE 1 TABLET BY MOUTH TWICE A DAY  . magnesium hydroxide (MILK OF MAGNESIA) 400 MG/5ML suspension Take 15 mLs by mouth daily as needed for mild constipation.  . meclizine (ANTIVERT) 12.5 MG tablet Take 1 tablet (12.5 mg total) by mouth 3 (three) times daily as needed.  . metoprolol tartrate (LOPRESSOR) 25 MG tablet TAKE 0.5 TABLETS (12.5 MG TOTAL) BY MOUTH 2 (TWO) TIMES DAILY.    Allergies  Allergen Reactions  . Synthroid [Levothyroxine] Hives  . Tylenol [Acetaminophen] Other (See Comments)    Makes my body feel numb     Social History   Tobacco Use  . Smoking status: Never Smoker  . Smokeless tobacco: Never Used  Substance Use Topics  . Alcohol use: No  . Drug use: No   family history includes Atrial fibrillation in her brother; Cancer (age of onset: 51) in her mother; Heart attack in her cousin; Hypertension in her daughter, son, and son; Pneumonia (age of onset: 48) in her father; Transient ischemic attack in her sister.  Wt Readings from Last 3 Encounters:  11/21/17 101 lb (45.8 kg)  10/31/17 100 lb (45.4 kg)  10/16/17 95 lb (43.1 kg)    PHYSICAL EXAM BP (!) 146/74   Pulse 85   Ht 5' 5"  (1.651 m)   Wt 101 lb (45.8 kg)   BMI 16.81 kg/m   Physical Exam  Constitutional: She is oriented to person, place, and time. No distress.  Thin, frail elderly woman who appears to.  Not emaciated, but borderline cachectic.  HENT:  Head: Normocephalic and atraumatic.  Neck: No hepatojugular reflux and no JVD present. Carotid bruit is not present.  Cardiovascular: Normal rate and intact distal pulses. An  irregularly irregular rhythm present. Exam reveals no gallop and no friction rub.  Murmur heard.  Medium-pitched harsh crescendo-decrescendo midsystolic murmur is present with a grade of 2/6 at the upper right sternal border radiating to the neck. Pulmonary/Chest: Breath sounds normal. No respiratory distress. She has no wheezes. She has no rales. She exhibits tenderness (Thoracic rib and vertebrae pain).  Mildly diminished breath sounds at the bases with poor respiratory effort.  Abdominal: Soft. Bowel sounds are normal. She exhibits no distension. There is no tenderness.  Musculoskeletal: Normal range of motion.  Thoracic kyphosis  Neurological: She is alert and oriented to person, place, and time.  Psychiatric: She has a normal mood and affect. Her behavior is normal.  She does answer questions, but oftentimes defers to her daughter.  Vitals reviewed.   Adult ECG Report  Rate: 85;  Rhythm: atrial fibrillation and Cannot exclude septal MI, age undetermined.  Otherwise normal axis, intervals and durations with  nonspecific ST and T wave changes.;   Narrative Interpretation: Stable EKG   Other studies Reviewed: Additional studies/ records that were reviewed today include:  Recent Labs:   Lab Results  Component Value Date   CREATININE 0.87 10/16/2017   BUN 22 10/16/2017   NA 133 (L) 10/16/2017   K 4.9 10/16/2017   CL 98 10/16/2017   CO2 23 10/16/2017    ASSESSMENT / PLAN: Problem List Items Addressed This Visit    Essential hypertension (Chronic)    Allowing permissive hypertension.  She is only on minimal dose of metoprolol.  With her dizziness and unsteady gait I would be reluctant to be aggressive.      Relevant Medications   furosemide (LASIX) 20 MG tablet   Mild aortic stenosis by prior echocardiogram (Chronic)    Mild stenosis with murmur on exam.  No plans for further follow-up.      Relevant Medications   furosemide (LASIX) 20 MG tablet   Other Relevant Orders    EKG 12-Lead (Completed)   Orthopnea    This is a relatively new finding for her, but in the setting of edema and some orthopnea, I think we can probably start as needed Lasix 20 mg which she can take up to 3 times a week.  With the potential of her being on Lasix, we will check chemistry panel in October timeframe before seeing PCP.      Relevant Orders   Basic metabolic panel   Persistent atrial fibrillation (HCC) -- CHA2DS2-VASc Score 6, on Low Dose Eliquis - Primary (Chronic)    For the most part, adequately rate controlled.  I would be reluctant to be any more aggressive beyond the current dose of metoprolol.  Can take occasional doses as needed for fast heart rates, but has not had to that I can tell. Is now taking low-dose Eliquis because she has not had any recent falls.    Low threshold to stop if there are more falls.      Relevant Medications   furosemide (LASIX) 20 MG tablet   Other Relevant Orders   EKG 12-Lead (Completed)   Basic metabolic panel    Other Visit Diagnoses    Medication management       Relevant Orders   Basic metabolic panel      Current medicines are reviewed at length with the patient today. (+/- concerns)  -none The following changes have been made:No changes.  Okay to use as needed additional beta-blocker  Patient Instructions  Medication instructions   may take 20 mg lasix ( furosemide)  Once a day as needed, do not take no more than 3 times a week.  no other changes.    Labs in OCT 2019 - WILL MAIL LAB SLIP  WITH INSTRUCTIONS  BMP      Your physician wants you to follow-up in MARCH 2020 [place]. You will receive a reminder letter in the mail two months in advance. If you don't receive a letter, please call our office to schedule the follow-up appointment.    If you need a refill on your cardiac medications before your next appointment, please call your pharmacy.     Studies Ordered:   Orders Placed This Encounter    Procedures  . Basic metabolic panel  . EKG 12-Lead      Glenetta Hew, M.D., M.S. Interventional Cardiologist   Pager # [phone] Phone # [phone] [address] [place], [place] [address]

## 2017-11-21 NOTE — Patient Instructions (Signed)
Medication instructions   may take 20 mg lasix ( furosemide)  Once a day as needed, do not take no more than 3 times a week.  no other changes.    Labs in OCT 2019 - WILL MAIL LAB SLIP  WITH INSTRUCTIONS  BMP      Your physician wants you to follow-up in MARCH 2020 WITH DR [facility]. You will receive a reminder letter in the mail two months in advance. If you don't receive a letter, please call our office to schedule the follow-up appointment.    If you need a refill on your cardiac medications before your next appointment, please call your pharmacy.

## 2017-11-23 ENCOUNTER — Encounter: Payer: Self-pay | Admitting: Cardiology

## 2017-11-23 NOTE — Assessment & Plan Note (Signed)
For the most part, adequately rate controlled.  I would be reluctant to be any more aggressive beyond the current dose of metoprolol.  Can take occasional doses as needed for fast heart rates, but has not had to that I can tell. Is now taking low-dose Eliquis because she has not had any recent falls.    Low threshold to stop if there are more falls.

## 2017-11-23 NOTE — Assessment & Plan Note (Signed)
Mild stenosis with murmur on exam.  No plans for further follow-up.

## 2017-11-23 NOTE — Assessment & Plan Note (Signed)
Allowing permissive hypertension.  She is only on minimal dose of metoprolol.  With her dizziness and unsteady gait I would be reluctant to be aggressive.

## 2017-11-23 NOTE — Assessment & Plan Note (Signed)
This is a relatively new finding for her, but in the setting of edema and some orthopnea, I think we can probably start as needed Lasix 20 mg which she can take up to 3 times a week.  With the potential of her being on Lasix, we will check chemistry panel in October timeframe before seeing PCP.

## 2017-11-24 ENCOUNTER — Other Ambulatory Visit (INDEPENDENT_AMBULATORY_CARE_PROVIDER_SITE_OTHER): Payer: Medicare HMO

## 2017-11-24 ENCOUNTER — Telehealth: Payer: Self-pay | Admitting: Pulmonary Disease

## 2017-11-24 ENCOUNTER — Ambulatory Visit (INDEPENDENT_AMBULATORY_CARE_PROVIDER_SITE_OTHER)
Admission: RE | Admit: 2017-11-24 | Discharge: 2017-11-24 | Disposition: A | Discharge status: Home / Self Care | Payer: Medicare HMO | Source: Ambulatory Visit | Attending: Pulmonary Disease | Admitting: Pulmonary Disease

## 2017-11-24 ENCOUNTER — Ambulatory Visit: Payer: Medicare HMO | Admitting: Pulmonary Disease

## 2017-11-24 ENCOUNTER — Encounter: Payer: Self-pay | Admitting: Pulmonary Disease

## 2017-11-24 VITALS — BP 130/78 | HR 71 | Temp 97.0°F | Ht 62.5 in | Wt 102.2 lb

## 2017-11-24 DIAGNOSIS — J90 Pleural effusion, not elsewhere classified: Secondary | ICD-10-CM

## 2017-11-24 DIAGNOSIS — R609 Edema, unspecified: Secondary | ICD-10-CM

## 2017-11-24 DIAGNOSIS — R0601 Orthopnea: Secondary | ICD-10-CM | POA: Diagnosis not present

## 2017-11-24 DIAGNOSIS — R0602 Shortness of breath: Secondary | ICD-10-CM | POA: Diagnosis not present

## 2017-11-24 LAB — COMPREHENSIVE METABOLIC PANEL
ALT: 26 U/L (ref 0–35)
AST: 30 U/L (ref 0–37)
Albumin: 4.4 g/dL (ref 3.5–5.2)
Alkaline Phosphatase: 61 U/L (ref 39–117)
BUN: 27 mg/dL — AB (ref 6–23)
CALCIUM: 9.8 mg/dL (ref 8.4–10.5)
CO2: 31 meq/L (ref 19–32)
Chloride: 96 mEq/L (ref 96–112)
Creatinine, Ser: 0.86 mg/dL (ref 0.40–1.20)
GFR: 65.55 mL/min (ref >60.00)
Glucose, Bld: 102 mg/dL — ABNORMAL HIGH (ref 70–99)
POTASSIUM: 4.8 meq/L (ref 3.5–5.1)
Sodium: 132 mEq/L — ABNORMAL LOW (ref 135–145)
Total Bilirubin: 0.8 mg/dL (ref 0.2–1.2)
Total Protein: 8.3 g/dL (ref 6.0–8.3)

## 2017-11-24 LAB — CBC
HCT: 38.4 % (ref 36.0–46.0)
Hemoglobin: 12.9 g/dL (ref 12.0–15.0)
MCHC: 33.6 g/dL (ref 30.0–36.0)
MCV: 93.3 fl (ref 78.0–100.0)
PLATELETS: 200 10*3/uL (ref 150.0–400.0)
RBC: 4.12 Mil/uL (ref 3.87–5.11)
RDW: 15.2 % (ref 11.5–15.5)
WBC: 6.7 10*3/uL (ref 4.0–10.5)

## 2017-11-24 LAB — D-DIMER, QUANTITATIVE (NOT AT ARMC): D DIMER QUANT: 1.14 ug{FEU}/mL — AB (ref <0.50)

## 2017-11-24 NOTE — Telephone Encounter (Signed)
Spoke with pt's daughter, Camelia Eng. Pt has been having increased SOB for the past few days. She has been scheduled to see Arlys John today at 11:15am. Nothing further was needed.

## 2017-11-24 NOTE — Progress Notes (Signed)
'@Patient'  ID: Caitlyn Henry, female    DOB: 06-12-1923, 84 y.o.   MRN: [id number]  Chief Complaint  Patient presents with  . Acute Visit    Shortness of breath    Referring provider: [name], NP  HPI:  84 year old female never smoker initially referred to our office on 10/31/2017 pleural effusions.    PMH: Fall risk, chronic back pain, Lyme disease Smoker/ Smoking History: Never Smoker Maintenance:  None Pt of:  Dr. Ander Slade  Recent [facility] Encounters:   10/31/2017-office visit-Dr. Ander Slade Patient presenting today for initial office visit.  Patient was recently treated for pneumonia at the end of June/2019.  She is feeling slightly better.  She is not coughing up significant amounts of phlegm or hemoptysis.  Weight has been stable recently.  She had a fall recently.  Issues with chronic back pain Plan: Discussed bronchoscopy versus ultrasound-guided thoracentesis.  We will hold off on bronchoscopy.  We will proceed forward with ultrasound guided thoracentesis  11/24/2017  - Visit   84 year old patient presents today with daughter for acute visit.  Patient reports shortness of breath for 1 to 2 weeks.  Patient was recently prescribed Lasix 20 mg 3 times a week.  Patient has been reluctant to start taking this therapy.  Patient does endorse that she is been having increased shortness of breath when lying flat.  Patient's daughters tried to have her start using wedge pillow patient has not started using this pillow.  Patient does endorse that after this is in August/2019 this of breath improved significantly.  Daughter brings patient in today with concerns of fluid overload.  Patient does not follow low-sodium diet at this time.  Patient also reports that she is been having increased productive of clear mucus.    Tests:  10/31/2017- thoracentesis reveals no cancer cells and fluid, all other results have been negative 10/31/2017-chest x-ray-tiny residual layering  right pleural effusion status post thoracentesis, small left pleural effusion 10/03/2017-CT chest without contrast- somewhat thick-walled cavitary lesion in the posterior segment right upper lobe, moderate right and small left pleural effusion 4/26 4/17-echocardiogram-LV ejection fraction 65 to 70%   Chart Review:  10/31/17 - Right Thoracentesis  >>>Right Thoracentesis, 500cc removed  >>>10/31/2017- thoracentesis reveals no cancer cells and fluid, all other results have been negative    Specialty Problems      Pulmonary Problems   Pleural effusion    10/31/17 - Right Thoracentesis  >>>Right Thoracentesis, 500cc removed  >>>10/31/2017- thoracentesis reveals no cancer cells and fluid, all other results have been negative       Orthopnea      Allergies  Allergen Reactions  . Synthroid [Levothyroxine] Hives  . Tylenol [Acetaminophen] Other (See Comments)    Makes my body feel numb     Immunization History  Administered Date(s) Administered  . Influenza, High Dose Seasonal PF 01/09/2017  . Influenza-Unspecified 11/19/2015  . Pneumococcal Conjugate-13 02/15/2017    Past Medical History:  Diagnosis Date  . Anemia   . Atrial fibrillation, persistent ([place]) 06/2015   Admitted with TIA. Diagnosed with A. fib. Rate control. On Eliquis.  . Compression fracture of T12 vertebra ([place])   . Hypothyroid   . Lyme disease 2017  . Stroke [facility]) 06/2015   -TIA    Tobacco History: Social History   Tobacco Use  Smoking Status Never Smoker  Smokeless Tobacco Never Used   Counseling given: Not Answered Continue not smoking  Outpatient Encounter Medications as of 11/24/2017  Medication Sig  .  ELIQUIS 2.5 MG TABS tablet TAKE 1 TABLET BY MOUTH TWICE A DAY  . magnesium hydroxide (MILK OF MAGNESIA) 400 MG/5ML suspension Take 15 mLs by mouth daily as needed for mild constipation.  . meclizine (ANTIVERT) 12.5 MG tablet Take 1 tablet (12.5 mg total) by mouth 3 (three) times daily as needed.    . metoprolol tartrate (LOPRESSOR) 25 MG tablet TAKE 0.5 TABLETS (12.5 MG TOTAL) BY MOUTH 2 (TWO) TIMES DAILY.  . furosemide (LASIX) 20 MG tablet May take  20 mg as needed no more than 3 times a week (Patient not taking: Reported on 11/24/2017)   No facility-administered encounter medications on file as of 11/24/2017.      Review of Systems  Review of Systems  Constitutional: Positive for fatigue. Negative for chills, fever and unexpected weight change.  HENT: Positive for congestion. Negative for ear pain, postnasal drip, sinus pressure and sinus pain.   Respiratory: Positive for cough (clear mucous ) and shortness of breath. Negative for chest tightness and wheezing.   Cardiovascular: Positive for leg swelling (L > R bilateral leg swelling). Negative for chest pain and palpitations.  Gastrointestinal: Negative for blood in stool, diarrhea, nausea and vomiting.  Genitourinary: Negative for dysuria, frequency and urgency.  Musculoskeletal: Positive for gait problem (in wheelchair ). Negative for arthralgias.  Skin: Negative for color change.  Allergic/Immunologic: Negative for environmental allergies and food allergies.  Neurological: Negative for dizziness, light-headedness and headaches.  Psychiatric/Behavioral: Positive for sleep disturbance (increased sob at night). Negative for dysphoric mood. The patient is not nervous/anxious.   All other systems reviewed and are negative.    Physical Exam  BP 130/78 (BP Location: Left Arm, Cuff Size: Normal)   Pulse 71   Temp (!) 97 F (36.1 C) (Oral)   Ht 5' 2.5" (1.588 m)   Wt 102 lb 3.2 oz (46.4 kg)   SpO2 95%   BMI 18.39 kg/m   Wt Readings from Last 5 Encounters:  11/24/17 102 lb 3.2 oz (46.4 kg)  11/21/17 101 lb (45.8 kg)  10/31/17 100 lb (45.4 kg)  10/16/17 95 lb (43.1 kg)  09/19/17 101 lb 9.6 oz (46.1 kg)     Physical Exam  Constitutional: She is oriented to person, place, and time and well-developed, well-nourished, and in  no distress. Vital signs are normal. She appears cachectic. No distress.  HENT:  Head: Normocephalic and atraumatic.  Right Ear: Hearing, tympanic membrane, external ear and ear canal normal.  Left Ear: Hearing, tympanic membrane, external ear and ear canal normal.  Nose: Nose normal. Right sinus exhibits no maxillary sinus tenderness and no frontal sinus tenderness. Left sinus exhibits no maxillary sinus tenderness and no frontal sinus tenderness.  Mouth/Throat: Uvula is midline and oropharynx is clear and moist. No oropharyngeal exudate.  Eyes: Pupils are equal, round, and reactive to light.  Neck: Normal range of motion. Neck supple. No JVD present.  Cardiovascular: Normal rate, regular rhythm and normal heart sounds.  Pulmonary/Chest: Effort normal and breath sounds normal. No accessory muscle usage. No respiratory distress. She has no decreased breath sounds. She has no wheezes. She has no rhonchi.  Musculoskeletal: Normal range of motion. She exhibits edema (2+ Lower extremily edema bilaterally, L>R).  Lymphadenopathy:    She has no cervical adenopathy.  Neurological: She is alert and oriented to person, place, and time. Gait normal.  Skin: Skin is warm and dry. She is not diaphoretic. No erythema.  Psychiatric: Mood, memory, affect and judgment normal.  Nursing note  and vitals reviewed.   Wells Criteria Modified Wells criteria: Clinical assessment for PE Clinical symptoms of DVT (leg swelling, pain with palpation) - 3 Other diagnoses less likely than pulmonary embolism - 3 Heart rate greater than 100 - 1.5 Immobilization (disease greater in 3 days) or surgery in the previous 4 weeks - 1.5 Previous DVT/PE - 1.5 Hemoptysis - 1 Malignancy - 1  Probability Traditional clinical probability assessment (Wells criteria) High - greater than 6 Moderate - 2 to 6 Low less than 2  Simplify clinical probability assessment (modified Wells criteria) PE likely-greater than 4 PE unlikely  little less than or equal to 4   Lab Results:  CBC    Component Value Date/Time   WBC 6.7 11/24/2017 1244   RBC 4.12 11/24/2017 1244   HGB 12.9 11/24/2017 1244   HCT 38.4 11/24/2017 1244   HCT 41.1 05/20/2016 1425   PLT 200.0 11/24/2017 1244   MCV 93.3 11/24/2017 1244   MCH 31.4 10/16/2017 1235   MCHC 33.6 11/24/2017 1244   RDW 15.2 11/24/2017 1244   LYMPHSABS 1,759 10/16/2017 1235   MONOABS 0.5 05/20/2016 1044   EOSABS 117 10/16/2017 1235   BASOSABS 58 10/16/2017 1235    BMET    Component Value Date/Time   NA 132 (L) 11/24/2017 1244   K 4.8 11/24/2017 1244   CL 96 11/24/2017 1244   CO2 31 11/24/2017 1244   GLUCOSE 102 (H) 11/24/2017 1244   BUN 27 (H) 11/24/2017 1244   CREATININE 0.86 11/24/2017 1244   CREATININE 0.87 10/16/2017 1235   CALCIUM 9.8 11/24/2017 1244   GFRNONAA 58 (L) 10/16/2017 1235   GFRAA 67 10/16/2017 1235    BNP No results found for: BNP  ProBNP No results found for: PROBNP  Imaging: Dg Chest 1 View  Result Date: 10/31/2017 CLINICAL DATA:  Follow-up examination status post right-sided thoracentesis. EXAM: CHEST  1 VIEW COMPARISON:  Comparison made with prior CT from 10/03/2017 FINDINGS: Mild cardiomegaly, stable from previous. Mediastinal silhouette within normal limits. Lungs hyperinflated with underlying emphysematous changes. Bilateral upper lobe architectural distortion with volume loss similar to previous. Cavitary lesion at the right upper lobe grossly similar to previous. No pneumothorax status post right-sided thoracentesis. A small residual right pleural effusion noted posteriorly. Small left pleural effusion present as well. No other focal airspace disease. No pulmonary edema. No acute osseous abnormality.  Osteopenia noted. IMPRESSION: 1. Tiny residual layering right pleural effusion status post thoracentesis. No pneumothorax or other complication. 2. Relatively stable appearance of right upper lobe cavitary lesion with upper lobe  predominant architectural distortion. 3. Small left pleural effusion. Electronically Signed   By: Jeannine Boga M.D.   On: 10/31/2017 14:35   Dg Chest 2 View  Result Date: 11/24/2017 CLINICAL DATA:  Follow-up thoracentesis EXAM: CHEST - 2 VIEW COMPARISON:  Chest x-ray of October 31, 2017 FINDINGS: The lungs are adequately inflated. There small amounts of pleural fluid blunting the costophrenic angles bilaterally. There is no pneumothorax or pneumomediastinum. The heart and pulmonary vascularity are normal. The interstitial markings are coarse. There are stable increased lung markings in the right apex. There is calcification in the wall of the aortic arch. IMPRESSION: No postprocedure complication following thoracentesis. Chronic interstitial changes bilaterally. Small residual pleural effusions bilaterally. Electronically Signed   By: David  Martinique M.D.   On: 11/24/2017 12:50   Ir Thoracentesis Asp Pleural Space W/img Guide  Result Date: 10/31/2017 INDICATION: New onset right pleural effusion; treated for PNA 08/2017 however ongoing dry  cough and shortness of breath. Request for diagnostic and therapeutic thoracentesis. EXAM: ULTRASOUND GUIDED RIGHT THORACENTESIS MEDICATIONS: 10 mL 2% lidocaine. COMPLICATIONS: None immediate. PROCEDURE: An ultrasound guided thoracentesis was thoroughly discussed with the patient and questions answered. The benefits, risks, alternatives and complications were also discussed. The patient understands and wishes to proceed with the procedure. Written consent was obtained. Ultrasound was performed to localize and mark an adequate pocket of fluid in the right chest. The area was then prepped and draped in the normal sterile fashion. 2% Lidocaine was used for local anesthesia. Under ultrasound guidance a 6 Fr Safe-T-Centesis catheter was introduced. Thoracentesis was performed. The catheter was removed and a dressing applied. FINDINGS: A total of approximately 500 mL of yellow  fluid was removed. Samples were sent to the laboratory as requested by the clinical team. IMPRESSION: Successful ultrasound guided right thoracentesis yielding 500 mL of pleural fluid. Read by Candiss Norse, PA-C Electronically Signed   By: Sandi Mariscal M.D.   On: 10/31/2017 14:44      Assessment & Plan:   Pleasant 84 year old patient seen today for acute visit.  We will have patient complete lab work-proBNP, c-Met, d-dimer, CBC.  We will also obtain chest x-ray today.  We will have patient continue follow-up with Dr. Ander Slade next week.  Emphasized the importance the patient that she is adherent to her diuretics so that we can thoracentesis if also emphasized the importance of a low-sodium diet as well as monitoring weights daily.  Provided diet educational information on patient's discharge paperwork.  Also suggested the patient follow-up with primary care regarding nutritionist referral.   Pleural effusion Lab work today in office  >>> proBNP, c-Met, d-dimer, CBC  Chest x-ray today stat  Keep follow-up with Dr. Ander Slade next week for right now  Elevate head when sleeping  Please be adherent to your 20 mg of Lasix 3 times a week and >>>Take this dose today  Low-sodium diet >>>Follow-up with primary care and request a formal nutritionist referral >>>Please invite your family or any caregivers to go to this referral with you so they can learn how to correct for you  Monitor weights daily  Edema Lab work today in office   Chest x-ray today stat  Elevate head when sleeping  Please be adherent to your 20 mg of Lasix 3 times a week and >>>Take this dose today  Low-sodium diet >>>Follow-up with primary care and request a formal nutritionist referral >>>Please invite your family or any caregivers to go to this referral with you so they can learn how to correct for you  Monitor weights daily  Orthopnea Lab work today in office   Chest x-ray today stat  Keep follow-up with  Dr. Ander Slade next week for right now  Elevate head when sleeping  Please be adherent to your 20 mg of Lasix 3 times a week and >>>Take this dose today  Low-sodium diet >>>Follow-up with primary care and request a formal nutritionist referral >>>Please invite your family or any caregivers to go to this referral with you so they can learn how to correct for you  Monitor weights daily     Lauraine Rinne, NP 11/24/2017

## 2017-11-24 NOTE — Assessment & Plan Note (Signed)
Lab work today in office  >>> proBNP, c-Met, d-dimer, CBC  Chest x-ray today stat  Keep follow-up with Dr. Ander Slade next week for right now  Elevate head when sleeping  Please be adherent to your 20 mg of Lasix 3 times a week and >>>Take this dose today  Low-sodium diet >>>Follow-up with primary care and request a formal nutritionist referral >>>Please invite your family or any caregivers to go to this referral with you so they can learn how to correct for you  Monitor weights daily

## 2017-11-24 NOTE — Assessment & Plan Note (Signed)
Lab work today in office   Chest x-ray today stat  Keep follow-up with Dr. Wynona Neat next week for right now  Elevate head when sleeping  Please be adherent to your 20 mg of Lasix 3 times a week and >>>Take this dose today  Low-sodium diet >>>Follow-up with primary care and request a formal nutritionist referral >>>Please invite your family or any caregivers to go to this referral with you so they can learn how to correct for you  Monitor weights daily

## 2017-11-24 NOTE — Assessment & Plan Note (Signed)
Lab work today in office   Chest x-ray today stat  Elevate head when sleeping  Please be adherent to your 20 mg of Lasix 3 times a week and >>>Take this dose today  Low-sodium diet >>>Follow-up with primary care and request a formal nutritionist referral >>>Please invite your family or any caregivers to go to this referral with you so they can learn how to correct for you  Monitor weights daily

## 2017-11-24 NOTE — Patient Instructions (Addendum)
Lab work today in office   Chest x-ray today stat  Keep follow-up with Dr. Wynona Neat next week for right now  Elevate head when sleeping  Please be adherent to your 20 mg of Lasix 3 times a week and >>>Take this dose today  Low-sodium diet >>>Follow-up with primary care and request a formal nutritionist referral >>>Please invite your family or any caregivers to go to this referral with you so they can learn how to correct for you  Monitor weights daily  It is flu season:   >>>Remember to be washing your hands regularly, using hand sanitizer, be careful to use around herself with has contact with people who are sick will increase her chances of getting sick yourself. >>> Best ways to protect herself from the flu: Receive the yearly flu vaccine, practice good hand hygiene washing with soap and also using hand sanitizer when available, eat a nutritious meals, get adequate rest, hydrate appropriately     Please contact the office if your symptoms worsen or you have concerns that you are not improving.   Thank you for choosing [facility] for your healthcare, and for allowing [place] to partner with you on your healthcare journey. I am thankful to be able to provide care to you today.   Elisha Headland FNP-C    Daily Weight Record It is important to weigh yourself daily. Keep this daily weight chart near your scale. Weigh yourself each morning at the same time. Weigh yourself without shoes, and wear the same amount of clothing each day. Compare today's weight to yesterday's weight. Bring this form with you to your follow-up appointments. Call your health care provider if you have concerns about your weight, including rapid weight gain or rapid weight loss. Date: ________ Weight: ____________________ Date: ________ Weight: ____________________ Date: ________ Weight: ____________________ Date: ________ Weight: ____________________ Date: ________ Weight: ____________________ Date:  ________ Weight: ____________________ Date: ________ Weight: ____________________ Date: ________ Weight: ____________________ Date: ________ Weight: ____________________ Date: ________ Weight: ____________________ Date: ________ Weight: ____________________ Date: ________ Weight: ____________________ Date: ________ Weight: ____________________ Date: ________ Weight: ____________________ Date: ________ Weight: ____________________ Date: ________ Weight: ____________________ Date: ________ Weight: ____________________ Date: ________ Weight: ____________________ Date: ________ Weight: ____________________ Date: ________ Weight: ____________________ Date: ________ Weight: ____________________ Date: ________ Weight: ____________________ Date: ________ Weight: ____________________ Date: ________ Weight: ____________________ Date: ________ Weight: ____________________ Date: ________ Weight: ____________________ Date: ________ Weight: ____________________ Date: ________ Weight: ____________________ Date: ________ Weight: ____________________ Date: ________ Weight: ____________________ Date: ________ Weight: ____________________ Date: ________ Weight: ____________________ Date: ________ Weight: ____________________ Date: ________ Weight: ____________________ Date: ________ Weight: ____________________ Date: ________ Weight: ____________________ Date: ________ Weight: ____________________ Date: ________ Weight: ____________________ Date: ________ Weight: ____________________ Date: ________ Weight: ____________________ Date: ________ Weight: ____________________ Date: ________ Weight: ____________________ Date: ________ Weight: ____________________ Date: ________ Weight: ____________________ Date: ________ Weight: ____________________ Date: ________ Weight: ____________________ Date: ________ Weight: ____________________ Date: ________ Weight: ____________________ Date: ________ Weight:  ____________________ Date: ________ Weight: ____________________ This information is not intended to replace advice given to you by your health care provider. Make sure you discuss any questions you have with your health care provider. Document Released: 05/19/2006 Document Revised: 02/19/2016 Document Reviewed: 10/04/2013 Elsevier Interactive Patient Education  2018 [employer]  Low-Sodium Eating Plan Sodium, which is an element that makes up salt, helps you maintain a healthy balance of fluids in your body. Too much sodium can increase your blood pressure and cause fluid and waste to be held in your body. Your health care provider or dietitian may recommend  following this plan if you have high blood pressure (hypertension), kidney disease, liver disease, or heart failure. Eating less sodium can help lower your blood pressure, reduce swelling, and protect your heart, liver, and kidneys. What are tips for following this plan? General guidelines  Most people on this plan should limit their sodium intake to 1,500-2,000 mg (milligrams) of sodium each day. Reading food labels  The Nutrition Facts label lists the amount of sodium in one serving of the food. If you eat more than one serving, you must multiply the listed amount of sodium by the number of servings.  Choose foods with less than 140 mg of sodium per serving.  Avoid foods with 300 mg of sodium or more per serving. Shopping  Look for lower-sodium products, often labeled as "low-sodium" or "no salt added."  Always check the sodium content even if foods are labeled as "unsalted" or "no salt added".  Buy fresh foods. ? Avoid canned foods and premade or frozen meals. ? Avoid canned, cured, or processed meats  Buy breads that have less than 80 mg of sodium per slice. Cooking  Eat more home-cooked food and less restaurant, buffet, and fast food.  Avoid adding salt when cooking. Use salt-free seasonings or herbs instead of table  salt or sea salt. Check with your health care provider or pharmacist before using salt substitutes.  Cook with plant-based oils, such as canola, sunflower, or olive oil. Meal planning  When eating at a restaurant, ask that your food be prepared with less salt or no salt, if possible.  Avoid foods that contain MSG (monosodium glutamate). MSG is sometimes added to [place] food, bouillon, and some canned foods. What foods are recommended? The items listed may not be a complete list. Talk with your dietitian about what dietary choices are best for you. Grains Low-sodium cereals, including oats, puffed wheat and rice, and shredded wheat. Low-sodium crackers. Unsalted rice. Unsalted pasta. Low-sodium bread. Whole-grain breads and whole-grain pasta. Vegetables Fresh or frozen vegetables. "No salt added" canned vegetables. "No salt added" tomato sauce and paste. Low-sodium or reduced-sodium tomato and vegetable juice. Fruits Fresh, frozen, or canned fruit. Fruit juice. Meats and other protein foods Fresh or frozen (no salt added) meat, poultry, seafood, and fish. Low-sodium canned tuna and salmon. Unsalted nuts. Dried peas, beans, and lentils without added salt. Unsalted canned beans. Eggs. Unsalted nut butters. Dairy Milk. Soy milk. Cheese that is naturally low in sodium, such as ricotta cheese, fresh mozzarella, or Swiss cheese Low-sodium or reduced-sodium cheese. Cream cheese. Yogurt. Fats and oils Unsalted butter. Unsalted margarine with no trans fat. Vegetable oils such as canola or olive oils. Seasonings and other foods Fresh and dried herbs and spices. Salt-free seasonings. Low-sodium mustard and ketchup. Sodium-free salad dressing. Sodium-free light mayonnaise. Fresh or refrigerated horseradish. Lemon juice. Vinegar. Homemade, reduced-sodium, or low-sodium soups. Unsalted popcorn and pretzels. Low-salt or salt-free chips. What foods are not recommended? The items listed may not be a complete  list. Talk with your dietitian about what dietary choices are best for you. Grains Instant hot cereals. Bread stuffing, pancake, and biscuit mixes. Croutons. Seasoned rice or pasta mixes. Noodle soup cups. Boxed or frozen macaroni and cheese. Regular salted crackers. Self-rising flour. Vegetables Sauerkraut, pickled vegetables, and relishes. Olives. [place] fries. Onion rings. Regular canned vegetables (not low-sodium or reduced-sodium). Regular canned tomato sauce and paste (not low-sodium or reduced-sodium). Regular tomato and vegetable juice (not low-sodium or reduced-sodium). Frozen vegetables in sauces. Meats and other protein foods Meat  or fish that is salted, canned, smoked, spiced, or pickled. Bacon, ham, sausage, hotdogs, corned beef, chipped beef, packaged lunch meats, salt pork, jerky, pickled herring, anchovies, regular canned tuna, sardines, salted nuts. Dairy Processed cheese and cheese spreads. Cheese curds. Blue cheese. Feta cheese. String cheese. Regular cottage cheese. Buttermilk. Canned milk. Fats and oils Salted butter. Regular margarine. Ghee. Bacon fat. Seasonings and other foods Onion salt, garlic salt, seasoned salt, table salt, and sea salt. Canned and packaged gravies. Worcestershire sauce. Tartar sauce. Barbecue sauce. Teriyaki sauce. Soy sauce, including reduced-sodium. Steak sauce. Fish sauce. Oyster sauce. Cocktail sauce. Horseradish that you find on the shelf. Regular ketchup and mustard. Meat flavorings and tenderizers. Bouillon cubes. Hot sauce and Tabasco sauce. Premade or packaged marinades. Premade or packaged taco seasonings. Relishes. Regular salad dressings. Salsa. Potato and tortilla chips. Corn chips and puffs. Salted popcorn and pretzels. Canned or dried soups. Pizza. Frozen entrees and pot pies. Summary  Eating less sodium can help lower your blood pressure, reduce swelling, and protect your heart, liver, and kidneys.  Most people on this plan should limit  their sodium intake to 1,500-2,000 mg (milligrams) of sodium each day.  Canned, boxed, and frozen foods are high in sodium. Restaurant foods, fast foods, and pizza are also very high in sodium. You also get sodium by adding salt to food.  Try to cook at home, eat more fresh fruits and vegetables, and eat less fast food, canned, processed, or prepared foods. This information is not intended to replace advice given to you by your health care provider. Make sure you discuss any questions you have with your health care provider. Document Released: 08/27/2001 Document Revised: 02/29/2016 Document Reviewed: 02/29/2016 Elsevier Interactive Patient Education  [employer].

## 2017-11-25 LAB — PRO B NATRIURETIC PEPTIDE: NT-Pro BNP: 19188 pg/mL — ABNORMAL HIGH (ref 0–738)

## 2017-11-27 ENCOUNTER — Other Ambulatory Visit: Payer: Self-pay

## 2017-11-27 ENCOUNTER — Encounter (HOSPITAL_COMMUNITY): Payer: Self-pay | Admitting: Emergency Medicine

## 2017-11-27 ENCOUNTER — Emergency Department (HOSPITAL_COMMUNITY): Payer: Medicare HMO

## 2017-11-27 ENCOUNTER — Emergency Department (HOSPITAL_COMMUNITY)
Admission: EM | Admit: 2017-11-27 | Discharge: 2017-11-27 | Disposition: A | Discharge status: Home / Self Care | Payer: Medicare HMO | Attending: Emergency Medicine | Admitting: Emergency Medicine

## 2017-11-27 DIAGNOSIS — Z8673 Personal history of transient ischemic attack (TIA), and cerebral infarction without residual deficits: Secondary | ICD-10-CM | POA: Diagnosis not present

## 2017-11-27 DIAGNOSIS — J90 Pleural effusion, not elsewhere classified: Secondary | ICD-10-CM | POA: Diagnosis not present

## 2017-11-27 DIAGNOSIS — Z7901 Long term (current) use of anticoagulants: Secondary | ICD-10-CM | POA: Diagnosis not present

## 2017-11-27 DIAGNOSIS — E039 Hypothyroidism, unspecified: Secondary | ICD-10-CM | POA: Diagnosis not present

## 2017-11-27 DIAGNOSIS — R600 Localized edema: Secondary | ICD-10-CM | POA: Insufficient documentation

## 2017-11-27 DIAGNOSIS — I10 Essential (primary) hypertension: Secondary | ICD-10-CM | POA: Diagnosis not present

## 2017-11-27 DIAGNOSIS — I481 Persistent atrial fibrillation: Secondary | ICD-10-CM | POA: Diagnosis not present

## 2017-11-27 DIAGNOSIS — Z79899 Other long term (current) drug therapy: Secondary | ICD-10-CM | POA: Insufficient documentation

## 2017-11-27 DIAGNOSIS — R079 Chest pain, unspecified: Secondary | ICD-10-CM | POA: Diagnosis present

## 2017-11-27 DIAGNOSIS — R609 Edema, unspecified: Secondary | ICD-10-CM

## 2017-11-27 LAB — I-STAT TROPONIN, ED: TROPONIN I, POC: 0.04 ng/mL (ref 0.00–0.08)

## 2017-11-27 LAB — CBC
HCT: 37.9 % (ref 36.0–46.0)
Hemoglobin: 11.8 g/dL — ABNORMAL LOW (ref 12.0–15.0)
MCH: 30.3 pg (ref 26.0–34.0)
MCHC: 31.1 g/dL (ref 30.0–36.0)
MCV: 97.4 fL (ref 78.0–100.0)
PLATELETS: 182 10*3/uL (ref 150–400)
RBC: 3.89 MIL/uL (ref 3.87–5.11)
RDW: 14.4 % (ref 11.5–15.5)
WBC: 6.3 10*3/uL (ref 4.0–10.5)

## 2017-11-27 LAB — BASIC METABOLIC PANEL
Anion gap: 9 (ref 5–15)
BUN: 21 mg/dL (ref 8–23)
CALCIUM: 9.1 mg/dL (ref 8.9–10.3)
CO2: 30 mmol/L (ref 22–32)
CREATININE: 0.97 mg/dL (ref 0.44–1.00)
Chloride: 96 mmol/L — ABNORMAL LOW (ref 98–111)
GFR, EST AFRICAN AMERICAN: 57 mL/min — AB (ref >60)
GFR, EST NON AFRICAN AMERICAN: 49 mL/min — AB (ref >60)
Glucose, Bld: 115 mg/dL — ABNORMAL HIGH (ref 70–99)
Potassium: 3.9 mmol/L (ref 3.5–5.1)
Sodium: 135 mmol/L (ref 135–145)

## 2017-11-27 LAB — BRAIN NATRIURETIC PEPTIDE: B Natriuretic Peptide: 1379.9 pg/mL — ABNORMAL HIGH (ref 0.0–100.0)

## 2017-11-27 MED ORDER — IOPAMIDOL (ISOVUE-370) INJECTION 76%
INTRAVENOUS | Status: AC
  Filled 2017-11-27: qty 100

## 2017-11-27 MED ORDER — IOPAMIDOL (ISOVUE-370) INJECTION 76%
100.0000 mL | Freq: Once | INTRAVENOUS | Status: AC | PRN
  Administered 2017-11-27: 100 mL via INTRAVENOUS

## 2017-11-27 NOTE — ED Notes (Signed)
Patient transported to X-ray 

## 2017-11-27 NOTE — Progress Notes (Signed)
Pt to be contacted and routed to emergency room, with fluid overload.   Elisha Headland FNP

## 2017-11-27 NOTE — ED Notes (Signed)
[place] sandwich given to pt per Dr. Charm Barges.

## 2017-11-27 NOTE — Progress Notes (Signed)
Patient successfully arrived in ER 11/27/2017.

## 2017-11-27 NOTE — ED Provider Notes (Signed)
[facility] EMERGENCY DEPARTMENT Provider Note   CSN: [id number] Arrival date & time: 11/27/17  1050     History   Chief Complaint Chief Complaint  Patient presents with  . fluid overload  . Chest Pain    HPI Caitlyn Henry is a 84 y.o. female.  She has a prior history of A. fib.  She is complaining of shortness of breath that began with a pneumonia at the end of June.  It sounds like during that.  She ended up needing to have a thoracentesis for an effusion.  She follows with lumbar pulmonology and was in the clinic on Friday and had some blood work and x-ray done.  She was called today that she was in failure and needed to go to the emergency department.  Sounds like she has been on Lasix for the past few weeks for signs of fluid overload.  She says she intermittently has chest pain but she feels that her ribs being sore from coughing and she is bringing up some thick white phlegm.  Denies fever.  Shortness of breath with exertion.  No nausea vomiting or diarrhea.  No leg pain.  The history is provided by the patient.  Shortness of Breath  This is a new problem. The problem has been gradually worsening. Associated symptoms include cough, sputum production, chest pain, rash and leg swelling. Pertinent negatives include no fever, no headaches, no rhinorrhea, no sore throat, no neck pain, no hemoptysis and no abdominal pain. It is unknown what precipitated the problem. The treatment provided no relief. She has had prior hospitalizations. Associated medical issues include pneumonia.    Past Medical History:  Diagnosis Date  . Anemia   . Atrial fibrillation, persistent ([place]) 06/2015   Admitted with TIA. Diagnosed with A. fib. Rate control. On Eliquis.  . Compression fracture of T12 vertebra ([place])   . Hypothyroid   . Lyme disease 2017  . Stroke [facility]) 06/2015   -TIA    Patient Active Problem List   Diagnosis Date Noted  . Edema 11/24/2017  . Orthopnea 11/21/2017    . Abnormal CT of the chest 10/31/2017  . Pleural effusion 10/31/2017  . History of recent pneumonia 10/31/2017  . Paroxysmal atrial fibrillation ([place]) 04/24/2017  . Altered mental status   . Acute encephalopathy 05/20/2016  . UTI (urinary tract infection) 05/20/2016  . T12 compression fracture ([place]) 05/20/2016  . Mild aortic stenosis by prior echocardiogram 11/20/2015  . Chronic anticoagulation 08-13-2015  . Dizziness, nonspecific 07/16/2015  . Cephalalgia   . TIA due to embolism ([place])   . Hypothyroidism 07/15/2015  . Acute left-sided weakness   . Encounter for anticoagulation discussion and counseling   . Stroke ([place]) 07/14/2015  . Left-sided weakness 07/14/2015  . Persistent atrial fibrillation ([facility]) -- CHA2DS2-VASc Score 6, on Low Dose Eliquis 07/14/2015  . Headache 07/14/2015  . Essential hypertension 07/14/2015  . Positive Lyme disease serology 07/14/2015  . Anemia     Past Surgical History:  Procedure Laterality Date  . ABDOMINAL HYSTERECTOMY  1968  . BLADDER SUSPENSION    . IR THORACENTESIS ASP PLEURAL SPACE W/IMG GUIDE  10/31/2017  . IR VERTEBROPLASTY CERV/THOR BX INC UNI/BIL INC/INJECT/IMAGING  03/23/2017  . IR VERTEBROPLASTY EA ADDL (T&LS) BX INC UNI/BIL INC INJECT/IMAGING  03/23/2017  . TRANSTHORACIC ECHOCARDIOGRAM  06/2015   In setting of TIA.  Moderate basal hypertrophy. Vigorous function with EF 65-70%. Mild aortic stenosis with mean gradient 11 mmHg. Severe MAC with mild MR.  Moderate LA dilation.     OB History   None      Home Medications    Prior to Admission medications   Medication Sig Start Date End Date Taking? Authorizing Provider  ELIQUIS 2.5 MG TABS tablet TAKE 1 TABLET BY MOUTH TWICE A DAY 08/08/17   Leonie Man, MD  furosemide (LASIX) 20 MG tablet May take  20 mg as needed no more than 3 times a week Patient not taking: Reported on 11/24/2017 11/21/17   Leonie Man, MD  magnesium hydroxide (MILK OF MAGNESIA) 400 MG/5ML suspension Take 15  mLs by mouth daily as needed for mild constipation.    [provider]  meclizine (ANTIVERT) 12.5 MG tablet Take 1 tablet (12.5 mg total) by mouth 3 (three) times daily as needed. 10/16/17   Reed, Tiffany L, DO  metoprolol tartrate (LOPRESSOR) 25 MG tablet TAKE 0.5 TABLETS (12.5 MG TOTAL) BY MOUTH 2 (TWO) TIMES DAILY. 08/08/17   Leonie Man, MD    Family History Family History  Problem Relation Age of Onset  . Heart attack Cousin   . Cancer Mother 21       breast  . Pneumonia Father 42  . Transient ischemic attack Sister   . Atrial fibrillation Brother   . Hypertension Daughter   . Hypertension Son   . Hypertension Son     Social History Social History   Tobacco Use  . Smoking status: Never Smoker  . Smokeless tobacco: Never Used  Substance Use Topics  . Alcohol use: No  . Drug use: No     Allergies   Synthroid [levothyroxine] and Tylenol [acetaminophen]   Review of Systems Review of Systems  Constitutional: Negative for fever.  HENT: Negative for rhinorrhea and sore throat.   Eyes: Negative for visual disturbance.  Respiratory: Positive for cough, sputum production and shortness of breath. Negative for hemoptysis.   Cardiovascular: Positive for chest pain and leg swelling.  Gastrointestinal: Negative for abdominal pain.  Genitourinary: Negative for dysuria.  Musculoskeletal: Negative for neck pain.  Skin: Positive for rash.  Neurological: Negative for headaches.     Physical Exam Updated Vital Signs BP (!) 155/75 (BP Location: Right Arm)   Pulse 80   Temp 97.6 F (36.4 C) (Oral)   Resp 16   SpO2 99%   Physical Exam  Constitutional: She appears well-developed and well-nourished. No distress.  HENT:  Head: Normocephalic and atraumatic.  Eyes: Conjunctivae are normal.  Neck: Neck supple.  Cardiovascular: Normal rate and normal pulses. An irregular rhythm present.  Murmur heard.  Systolic murmur is present with a grade of  5/6. Pulmonary/Chest: Effort normal. No respiratory distress. She has rales in the left middle field and the left lower field.  Abdominal: Soft. There is no tenderness.  Musculoskeletal: She exhibits no edema or tenderness.  Neurological: She is alert.  Skin: Skin is warm and dry.  She is a petechial rash on bilateral lower extremities from mid shin down.  And tender.  Psychiatric: She has a normal mood and affect.  Nursing note and vitals reviewed.    ED Treatments / Results  Labs (all labs ordered are listed, but only abnormal results are displayed) Labs Reviewed  BASIC METABOLIC PANEL - Abnormal; Notable for the following components:      Result Value   Chloride 96 (*)    Glucose, Bld 115 (*)    GFR calc non Af Amer 49 (*)    GFR calc Af Wyvonnia Lora  57 (*)    All other components within normal limits  CBC - Abnormal; Notable for the following components:   Hemoglobin 11.8 (*)    All other components within normal limits  BRAIN NATRIURETIC PEPTIDE  I-STAT TROPONIN, ED    EKG EKG Interpretation  Date/Time:  Monday November 27 2017 11:02:05 EDT Ventricular Rate:  88 PR Interval:    QRS Duration: 88 QT Interval:  390 QTC Calculation: 471 R Axis:   111 Text Interpretation:  Atrial fibrillation with premature ventricular or aberrantly conducted complexes Anterolateral infarct , age undetermined Abnormal ECG no acute st/ts compared with prior 12/18 Confirmed by Aletta Edouard [phone]) on 11/27/2017 12:38:44 PM   Radiology Dg Chest 2 View  Result Date: 11/27/2017 CLINICAL DATA:  Chest pain and cough EXAM: CHEST - 2 VIEW COMPARISON:  11/24/2017 FINDINGS: Cardiac shadow is stable. Lungs are well aerated bilaterally. Coarsened interstitial markings are again identified bilaterally. Cavitary lesion is noted in the right apex stable from the prior exam. Small bilateral pleural effusions are noted. Changes of prior vertebral augmentation are again seen. IMPRESSION: Overall stable appearance  of the chest with cavitary lesion in the right upper lobe and small pleural effusions. Electronically Signed   By: Inez Catalina M.D.   On: 11/27/2017 13:01   Ct Angio Chest Pe W/cm &/or Wo Cm  Result Date: 11/27/2017 CLINICAL DATA:  Positive D-dimer.  Left chest pain. EXAM: CT ANGIOGRAPHY CHEST WITH CONTRAST TECHNIQUE: Multidetector CT imaging of the chest was performed using the standard protocol during bolus administration of intravenous contrast. Multiplanar CT image reconstructions and MIPs were obtained to evaluate the vascular anatomy. CONTRAST:  192m ISOVUE-370 IOPAMIDOL (ISOVUE-370) INJECTION 76% COMPARISON:  10/03/2017.  Chest x-ray 11/27/2017. FINDINGS: Cardiovascular: No filling defects in the pulmonary arteries to suggest pulmonary emboli. Cardiomegaly. Aorta is normal caliber. Severe diffuse coronary artery calcifications. Mediastinum/Nodes: No mediastinal, hilar, or axillary adenopathy. Lungs/Pleura: Large right pleural effusion and moderate left pleural effusion. This is stable on the left since prior study, slightly increased on the right since prior study. Thick walled cavitary lesion is again noted in the right upper lobe, difficult to accurately measure due to surrounding airspace disease and effusion, but approximately 2.6 x 2.5 cm compared with 2.7 x 2.6 cm previously, likely not significantly changed. Surrounding volume loss and consolidation in the posterior segment of the right upper lobe is unchanged. Compressive atelectasis in the lower lobes. Upper Abdomen: Imaging into the upper abdomen shows no acute findings. Musculoskeletal: Chest wall soft tissues are unremarkable. Severe compression deformity and changes of vertebroplasty at T12, stable. No acute bony abnormality. Review of the MIP images confirms the above findings. IMPRESSION: No evidence of pulmonary embolus. Stable cavitary lesion within the right upper lobe. Surrounding volume loss and/or consolidation noted. These findings  could be reflective of primary bronchogenic carcinoma or abscess. Large right pleural effusion, slightly larger than prior study. Moderate left pleural effusion, stable. Compressive atelectasis in the lower lobes. Marked cardiomegaly. No evidence of pulmonary embolus. Electronically Signed   By: KRolm BaptiseM.D.   On: 11/27/2017 18:57    Procedures Procedures (including critical care time)  Medications Ordered in ED Medications  iopamidol (ISOVUE-370) 76 % injection 100 mL (100 mLs Intravenous Contrast Given 11/27/17 1832)     Initial Impression / Assessment and Plan / ED Course  I have reviewed the triage vital signs and the nursing notes.  Pertinent labs & imaging results that were available during my care of the patient were  reviewed by me and considered in my medical decision making (see chart for details).  Clinical Course as of Nov 29 1003  Mon Nov 27, 2017  1440 Discussed with pulmonology low-power who did review her notes from the clinic and did recommend proceeding with a CT angio   [MB]  1551 Reviewed the platelet the patient and her daughter.  My feeling is if she does not have anything significant on her chest CT she probably can go home and continue her diuretics and close follow-up with cardiology and pulmonology.   [MB]  1605 Patient signed out to Dr. Tyrone Nine with a CT PE pending.  Plan is if no obvious abnormalities that would require admission she can be discharged to follow-up outpatient with her cardiologist and pulmonologist.   [MB]    Clinical Course User Index [MB] Hayden Rasmussen, MD      Final Clinical Impressions(s) / ED Diagnoses   Final diagnoses:  Pleural effusion  Peripheral edema    ED Discharge Orders    None       Hayden Rasmussen, MD 11/28/17 1006

## 2017-11-27 NOTE — ED Notes (Addendum)
RN called to check on BNP. Running now.

## 2017-11-27 NOTE — Progress Notes (Signed)
Patient's lab work is come back elevated.  Showing she is holding onto a significant amount of fluid.  We will have patient use fluid pill to 40 mg daily next 3 days.  Then resume 20 mg three times a week. We will also order echocardiogram to evaluate for congestive heart failure.   Chest xray looks okay.   Ddimer is slightly elevated.  With age this is minorly elevated.  We will continue to watch this.  We could consider a CT Angio in the future to evaluate for pulmonary embolism which is a clot in your lungs. If shortness of breath increases suddenly or have chest pain, heart racing, then present to emergency room.  Keep follow-up with Dr. Wynona Neat this week.  Elisha Headland FNP

## 2017-11-27 NOTE — Progress Notes (Signed)
Patient's daughter called and updated with patient's to go to Emergency room per Elisha Headland FNP

## 2017-11-27 NOTE — ED Triage Notes (Addendum)
Pt states she saw her doctor Friday, was put on medicine for leg swelling, states she was contacted today by her dr office and told to come to ED but unsure why. Also endorses some ongoing left chest pain since being diagnosed with pneumonia a while back, has productive cough, resp e/u, nad. D dimer 1.14 and NT pro BNP 19000 per results in chart from 9/6

## 2017-11-27 NOTE — ED Provider Notes (Signed)
84 yo F with a chief complaint of shortness of breath.  Patient has had a recurrent right-sided pericardial effusion.  She has been seen by her pulmonologist for this.  Has had a thoracentesis without cancerous etiology.  Had worsened over the past few days.  Initially seen by Dr. Charm Barges.  I received patient in signout.  She was sent from clinic for CT angiogram of the chest to evaluate for PE.  This is negative.  Patient does have recurrent right-sided pleural effusion.  I discussed results with the patient.  We will have her follow-up with her pulmonologist. They have recently started her on lasix.   Has an appointment tomorrow morning.   Melene Plan, DO 11/27/17 2001

## 2017-11-27 NOTE — Discharge Instructions (Signed)
You appear to have more fluid retention than prior.  Please take your lasix as prescribed.  You do not have a blood clot in your lung, please call your lung doctor and discuss your visit here and see when they want to see you in the office.  Return for worsening symptoms.

## 2017-11-27 NOTE — Progress Notes (Signed)
After further thought and discussion.  I do think it would actually be more appropriate for you to present to the emergency room for further work-up for the fluid overload.  That way you can also have an echocardiogram done clinically and further evaluation of the d-dimer.  Please present to the closest emergency room.  Please follow-up with our office after discharge.  Elisha Headland FNP

## 2017-11-28 ENCOUNTER — Ambulatory Visit (INDEPENDENT_AMBULATORY_CARE_PROVIDER_SITE_OTHER): Payer: Medicare HMO | Admitting: Pulmonary Disease

## 2017-11-28 ENCOUNTER — Encounter: Payer: Self-pay | Admitting: Pulmonary Disease

## 2017-11-28 VITALS — BP 90/62 | HR 135 | Ht 62.0 in | Wt 97.8 lb

## 2017-11-28 DIAGNOSIS — I509 Heart failure, unspecified: Secondary | ICD-10-CM

## 2017-11-28 MED ORDER — FUROSEMIDE 20 MG PO TABS: 20.0000 mg | ORAL_TABLET | ORAL | 1 refills | Status: DC

## 2017-11-28 NOTE — Patient Instructions (Signed)
Decompensated congestive heart failure  Bilateral pleural effusions  We will continue Lasix 20 mg p.o. q. alternate days  Obtain a Chem-7 in about a week  I will see you in about 6 weeks

## 2017-11-28 NOTE — Progress Notes (Signed)
[place]    [id number]    08/05/1923  Primary Care Physician:Eubanks, Carlos American, NP  Referring Physician: [name], NP [place], [place] [address]  Chief complaint:   Cough, shortness of breath Was in the ED yesterday for shortness of breath  HPI:  Was recently treated for pneumonia end of June Recently lost her spouse of many years Multiple comorbidities She is not really coughing up significant amounts of phlegm, no hemoptysis Weight has been stable recently, though did lose some weight over the last couple of years-felt related to her thyroid Has had a fall recently Issues with chronic back pain Diagnosis of Lyme's in the last couple years  Occupation:office work Exposures: No significant mold exposure Smoking history: Never smoker, was exposed to secondhand smoke  Outpatient Encounter Medications as of 11/28/2017  Medication Sig  . ELIQUIS 2.5 MG TABS tablet TAKE 1 TABLET BY MOUTH TWICE A DAY (Patient taking differently: Take 2.5 mg by mouth 2 (two) times daily. )  . furosemide (LASIX) 20 MG tablet Take 1 tablet (20 mg total) by mouth every other day.  . levothyroxine (SYNTHROID, LEVOTHROID) 50 MCG tablet Take 50 mcg by mouth daily before breakfast. Must be NAME BRAND  . loratadine (CLARITIN) 10 MG tablet Take 10 mg by mouth daily.  . magnesium hydroxide (MILK OF MAGNESIA) 400 MG/5ML suspension Take 15 mLs by mouth daily as needed for mild constipation.  . metoprolol tartrate (LOPRESSOR) 25 MG tablet TAKE 0.5 TABLETS (12.5 MG TOTAL) BY MOUTH 2 (TWO) TIMES DAILY. (Patient taking differently: Take 12.5 mg by mouth See admin instructions. Take 1/2 tablet (12.5 mg) by mouth twice daily, may take one extra half tablet (12.5 mg) if needed for racing heart)  . naphazoline-pheniramine (VISINE-A) 0.025-0.3 % ophthalmic solution Place 1 drop into both eyes 2 (two) times daily.  . [DISCONTINUED] furosemide (LASIX) 20 MG tablet May take  20 mg  as needed no more than 3 times a week (Patient taking differently: Take 20 mg by mouth 3 (three) times a week. )  . [DISCONTINUED] meclizine (ANTIVERT) 12.5 MG tablet Take 1 tablet (12.5 mg total) by mouth 3 (three) times daily as needed. (Patient taking differently: Take 12.5 mg by mouth 3 (three) times daily as needed for dizziness. )   No facility-administered encounter medications on file as of 11/28/2017.     Allergies as of 11/28/2017 - Review Complete 11/28/2017  Allergen Reaction Noted  . Synthroid [levothyroxine] Other (See Comments) 06/08/2017  . Tylenol [acetaminophen] Other (See Comments) 08/25/2014    Past Medical History:  Diagnosis Date  . Anemia   . Atrial fibrillation, persistent ([place]) 06/2015   Admitted with TIA. Diagnosed with A. fib. Rate control. On Eliquis.  . Compression fracture of T12 vertebra ([place])   . Hypothyroid   . Lyme disease 2017  . Stroke [facility]) 06/2015   -TIA    Past Surgical History:  Procedure Laterality Date  . ABDOMINAL HYSTERECTOMY  1968  . BLADDER SUSPENSION    . IR THORACENTESIS ASP PLEURAL SPACE W/IMG GUIDE  10/31/2017  . IR VERTEBROPLASTY CERV/THOR BX INC UNI/BIL INC/INJECT/IMAGING  03/23/2017  . IR VERTEBROPLASTY EA ADDL (T&LS) BX INC UNI/BIL INC INJECT/IMAGING  03/23/2017  . TRANSTHORACIC ECHOCARDIOGRAM  06/2015   In setting of TIA.  Moderate basal hypertrophy. Vigorous function with EF 65-70%. Mild aortic stenosis with mean gradient 11 mmHg. Severe MAC with mild MR. Moderate LA dilation.  Family History  Problem Relation Age of Onset  . Heart attack Cousin   . Cancer Mother 44       breast  . Pneumonia Father 33  . Transient ischemic attack Sister   . Atrial fibrillation Brother   . Hypertension Daughter   . Hypertension Son   . Hypertension Son     Social History   Socioeconomic History  . Marital status: Widowed    Spouse name: Not on file  . Number of children: Not on file  . Years of education: Not on file  . Highest  education level: Not on file  Occupational History  . Not on file  Social Needs  . Financial resource strain: Not on file  . Food insecurity:    Worry: Never true    Inability: Never true  . Transportation needs:    Medical: No    Non-medical: No  Tobacco Use  . Smoking status: Never Smoker  . Smokeless tobacco: Never Used  Substance and Sexual Activity  . Alcohol use: No  . Drug use: No  . Sexual activity: Never  Lifestyle  . Physical activity:    Days per week: 0 days    Minutes per session: 0 min  . Stress: To some extent  Relationships  . Social connections:    Talks on phone: More than three times a week    Gets together: Never    Attends religious service: Never    Active member of club or organization: No    Attends meetings of clubs or organizations: Never    Relationship status: Married  . Intimate partner violence:    Fear of current or ex partner: No    Emotionally abused: No    Physically abused: No    Forced sexual activity: No  Other Topics Concern  . Not on file  Social History Narrative   Social History      Diet?       Do you drink/eat things with caffeine? coffee      Marital status?       married                             What year were you married? 1947      Do you live in a house, apartment, assisted living, condo, trailer, etc.? house      Is it one or more stories? Yes-but does not go upstairs      How many persons live in your home? 3      Do you have any pets in your home? (please list) 1 dog      Highest level of education completed?      Current or past profession: Clinical cytogeneticist      Do you exercise?                                      Type & how often?      Advanced Directives      Do you have a living will? no      Do you have a DNR form?     no                             If not, do you want to discuss one? yes  Do you have signed POA/HPOA for forms?       Functional Status      Do you have difficulty bathing or  dressing yourself?      Do you have difficulty preparing food or eating?       Do you have difficulty managing your medications?      Do you have difficulty managing your finances?      Do you have difficulty affording your medications?    Review of systems: Review of Systems  Constitutional: Negative for fever and chills.  HENT: Negative.   Eyes: Negative for blurred vision.  Respiratory: Shortness of breath on exertion Cardiovascular: Negative for chest pain and palpitations.  Musculoskeletal: Negative for myalgias, back pain and joint pain.   All other systems reviewed and are negative.  Physical Exam:  Vitals:   11/28/17 1344  BP: 90/62  Pulse: (!) 135  SpO2: 96%   Gen:      No acute distress, frail HEENT:  EOMI, sclera anicteric Neck:     No masses; no thyromegaly Lungs:    Decreased air entry bilaterally CV:         Regular rate and rhythm; no murmurs Abd:     Bowel sounds appreciated Psych: normal mood and affect  Data Reviewed: .CT scan of the chest which was performed on 11/27/2017 reviewed by myself showing large pleural effusions bilaterally  .  Records reviewed  .  Previous CT reviewed and compared with current .  proBNP is markedly elevated, .  ED records reviewed  Assessment:  .  Abnormal CT scan of the chest showing a cavitary right upper lobe nodule .  Bilateral pleural effusions .  Decompensated congestive heart failure .  Patient is very frail  Plan/Recommendations:  .  Continue diuretics .  Lasix 20 mg p.o.. Q. alternate days  .  There is a real possibility of a neoplastic process however the patient is too frail for further invasive intervention  .  Tolerated thoracentesis well .  Continue diuresis .  If she gets more short of breath, she should call us and we will plan for thoracentesis as an outpatient   She was ambulated in the hallway, did not desaturate significantly on room air  I will see her back in the office in 6  weeks    Sherrilyn Rist MD [facility] 11/28/2017, 2:23 PM  CC: [name], NP

## 2017-11-30 LAB — FUNGAL ORGANISM REFLEX

## 2017-11-30 LAB — FUNGUS CULTURE RESULT

## 2017-11-30 LAB — FUNGUS CULTURE WITH STAIN

## 2017-12-07 ENCOUNTER — Telehealth: Payer: Self-pay | Admitting: Pulmonary Disease

## 2017-12-07 DIAGNOSIS — I509 Heart failure, unspecified: Secondary | ICD-10-CM

## 2017-12-07 NOTE — Telephone Encounter (Signed)
Spoke with daugther Terri, order placed for BMP. Informed she could stop by at her convenience to have blood work done. Voiced understanding. Nothing further needed at this time.

## 2018-01-10 ENCOUNTER — Ambulatory Visit: Payer: Medicare HMO | Admitting: Pulmonary Disease

## 2018-01-10 ENCOUNTER — Ambulatory Visit (INDEPENDENT_AMBULATORY_CARE_PROVIDER_SITE_OTHER)
Admission: RE | Admit: 2018-01-10 | Discharge: 2018-01-10 | Disposition: A | Discharge status: Home / Self Care | Payer: Medicare HMO | Source: Ambulatory Visit | Attending: Pulmonary Disease | Admitting: Pulmonary Disease

## 2018-01-10 ENCOUNTER — Encounter: Payer: Self-pay | Admitting: Pulmonary Disease

## 2018-01-10 ENCOUNTER — Other Ambulatory Visit (INDEPENDENT_AMBULATORY_CARE_PROVIDER_SITE_OTHER): Payer: Medicare HMO

## 2018-01-10 VITALS — BP 138/78 | HR 80 | Ht 62.0 in | Wt 99.0 lb

## 2018-01-10 DIAGNOSIS — Z23 Encounter for immunization: Secondary | ICD-10-CM

## 2018-01-10 DIAGNOSIS — I509 Heart failure, unspecified: Secondary | ICD-10-CM

## 2018-01-10 LAB — BASIC METABOLIC PANEL
BUN: 28 mg/dL — ABNORMAL HIGH (ref 6–23)
CALCIUM: 9.5 mg/dL (ref 8.4–10.5)
CO2: 29 meq/L (ref 19–32)
CREATININE: 0.95 mg/dL (ref 0.40–1.20)
Chloride: 97 mEq/L (ref 96–112)
GFR: 58.42 mL/min — ABNORMAL LOW (ref >60.00)
GLUCOSE: 100 mg/dL — AB (ref 70–99)
Potassium: 4.5 mEq/L (ref 3.5–5.1)
Sodium: 132 mEq/L — ABNORMAL LOW (ref 135–145)

## 2018-01-10 LAB — BRAIN NATRIURETIC PEPTIDE: PRO B NATRI PEPTIDE: 1625 pg/mL — AB (ref 0.0–100.0)

## 2018-01-10 NOTE — Progress Notes (Signed)
[place]    [id number]    Sep 15, 1923  Primary Care Physician:Eubanks, Carlos American, NP  Referring Physician: [name], NP [place], [place] [address]  Chief complaint:   Cough, shortness of breath Symptoms are much better  HPI:   Was recently treated for pneumonia end of June Recently lost her spouse of many years Multiple comorbidities-she has been feeling much better generally  Appetite is better, weight is stable  Issues with chronic back pain Diagnosis of Lyme's in the last couple years  Occupation:office work Exposures: No significant mold exposure Smoking history: Never smoker, was exposed to secondhand smoke  Outpatient Encounter Medications as of 01/10/2018  Medication Sig  . ELIQUIS 2.5 MG TABS tablet TAKE 1 TABLET BY MOUTH TWICE A DAY (Patient taking differently: Take 2.5 mg by mouth 2 (two) times daily. )  . furosemide (LASIX) 20 MG tablet Take 1 tablet (20 mg total) by mouth every other day. (Patient taking differently: Take 20 mg by mouth 2 (two) times a week. )  . levothyroxine (SYNTHROID, LEVOTHROID) 50 MCG tablet Take 50 mcg by mouth daily before breakfast. Must be NAME BRAND  . loratadine (CLARITIN) 10 MG tablet Take 10 mg by mouth daily.  . magnesium hydroxide (MILK OF MAGNESIA) 400 MG/5ML suspension Take 15 mLs by mouth daily as needed for mild constipation.  . metoprolol tartrate (LOPRESSOR) 25 MG tablet TAKE 0.5 TABLETS (12.5 MG TOTAL) BY MOUTH 2 (TWO) TIMES DAILY. (Patient taking differently: Take 12.5 mg by mouth See admin instructions. Take 1/2 tablet (12.5 mg) by mouth twice daily, may take one extra half tablet (12.5 mg) if needed for racing heart)  . naphazoline-pheniramine (VISINE-A) 0.025-0.3 % ophthalmic solution Place 1 drop into both eyes 2 (two) times daily.   No facility-administered encounter medications on file as of 01/10/2018.     Allergies as of 01/10/2018 - Review Complete 01/10/2018  Allergen  Reaction Noted  . Synthroid [levothyroxine] Other (See Comments) 06/08/2017  . Tylenol [acetaminophen] Other (See Comments) 08/25/2014    Past Medical History:  Diagnosis Date  . Anemia   . Atrial fibrillation, persistent 06/2015   Admitted with TIA. Diagnosed with A. fib. Rate control. On Eliquis.  . Compression fracture of T12 vertebra ([place])   . Hypothyroid   . Lyme disease 2017  . Stroke [facility]) 06/2015   -TIA    Past Surgical History:  Procedure Laterality Date  . ABDOMINAL HYSTERECTOMY  1968  . BLADDER SUSPENSION    . IR THORACENTESIS ASP PLEURAL SPACE W/IMG GUIDE  10/31/2017  . IR VERTEBROPLASTY CERV/THOR BX INC UNI/BIL INC/INJECT/IMAGING  03/23/2017  . IR VERTEBROPLASTY EA ADDL (T&LS) BX INC UNI/BIL INC INJECT/IMAGING  03/23/2017  . TRANSTHORACIC ECHOCARDIOGRAM  06/2015   In setting of TIA.  Moderate basal hypertrophy. Vigorous function with EF 65-70%. Mild aortic stenosis with mean gradient 11 mmHg. Severe MAC with mild MR. Moderate LA dilation.    Family History  Problem Relation Age of Onset  . Heart attack Cousin   . Cancer Mother 70       breast  . Pneumonia Father 36  . Transient ischemic attack Sister   . Atrial fibrillation Brother   . Hypertension Daughter   . Hypertension Son   . Hypertension Son     Social History   Socioeconomic History  . Marital status: Widowed    Spouse name: Not on file  . Number of children: Not on file  .  Years of education: Not on file  . Highest education level: Not on file  Occupational History  . Not on file  Social Needs  . Financial resource strain: Not on file  . Food insecurity:    Worry: Never true    Inability: Never true  . Transportation needs:    Medical: No    Non-medical: No  Tobacco Use  . Smoking status: Never Smoker  . Smokeless tobacco: Never Used  Substance and Sexual Activity  . Alcohol use: No  . Drug use: No  . Sexual activity: Never  Lifestyle  . Physical activity:    Days per week: 0 days      Minutes per session: 0 min  . Stress: To some extent  Relationships  . Social connections:    Talks on phone: More than three times a week    Gets together: Never    Attends religious service: Never    Active member of club or organization: No    Attends meetings of clubs or organizations: Never    Relationship status: Married  . Intimate partner violence:    Fear of current or ex partner: No    Emotionally abused: No    Physically abused: No    Forced sexual activity: No  Other Topics Concern  . Not on file  Social History Narrative   Social History      Diet?       Do you drink/eat things with caffeine? coffee      Marital status?       married                             What year were you married? 1947      Do you live in a house, apartment, assisted living, condo, trailer, etc.? house      Is it one or more stories? Yes-but does not go upstairs      How many persons live in your home? 3      Do you have any pets in your home? (please list) 1 dog      Highest level of education completed?      Current or past profession: Clinical cytogeneticist      Do you exercise?                                      Type & how often?      Advanced Directives      Do you have a living will? no      Do you have a DNR form?     no                             If not, do you want to discuss one? yes      Do you have signed POA/HPOA for forms?       Functional Status      Do you have difficulty bathing or dressing yourself?      Do you have difficulty preparing food or eating?       Do you have difficulty managing your medications?      Do you have difficulty managing your finances?      Do you have difficulty affording your medications?    Review of systems: Review of  Systems  Constitutional: No fever, no chills HENT: Negative.  No nasal congestion Eyes: Negative for blurred vision.  Respiratory: Shortness of breath on exertion Cardiovascular: Negative for chest pain and  palpitations.  No leg swelling  all other systems reviewed and are negative.  Physical Exam:  Vitals:   01/10/18 1209  BP: 138/78  Pulse: 80  SpO2: 96%   Gen:      No acute distress, frail, comfortable HEENT:  EOMI, sclera anicteric Neck:    No thyromegaly Lungs:    decreased air entry at the bases bilaterally CV:         S1-S2 appreciated Abd:     Bowel sounds appreciated  Data Reviewed: .CT scan of the chest which was performed on 11/27/2017 reviewed by myself showing large pleural effusions bilaterally  .  Records reviewed  Assessment:  .  Abnormal CT scan of the chest showing a cavitary right upper lobe nodule -Has remained stable -No plans for any invasive intervention  .  Bilateral pleural effusions -Stable status -Stable symptoms  .  Decompensated congestive heart failure -Appears to be better compensated, symptom wise  .  Patient is very frail  Plan/Recommendations:  .  Continue diuretics .  Lasix 20 mg p.o.. Q.-taking Lasix twice a week at present When she was taking it 3 times a week she was not feeling as good  .  There is a real possibility of a neoplastic process however the patient is too frail for further invasive intervention  -she continues to do well No invasive intervention desired  .  Continue diuresis  .  If she gets more short of breath, she should call us and we will plan for thoracentesis as an outpatient  I will see her back in 6 weeks Continues to do well  We will give her a flu shot today    Sherrilyn Rist MD [facility] 01/10/2018, 12:12 PM  CC: [name], NP

## 2018-01-10 NOTE — Patient Instructions (Signed)
Congestive heart failure, feeling better  Functioning well  Flu shot can be administered today  Continue Lasix We will check blood work, chest x-ray We will see her back in about 6 weeks

## 2018-01-17 ENCOUNTER — Telehealth: Payer: Self-pay | Admitting: Pulmonary Disease

## 2018-01-17 NOTE — Telephone Encounter (Signed)
Called and spoke with Patient's Daughter, Camelia Eng.  Results and recommendations from Dr. Wynona Neat given.  Understanding stated.  Nothing further at this time.

## 2018-01-17 NOTE — Telephone Encounter (Signed)
Called patient unable to reach left message to give [place] a call back.   OA please advise on patients labs and xray. Thank you

## 2018-01-17 NOTE — Telephone Encounter (Signed)
Stable from previous  Little low sodium-nothing to change  To call us if she has any changes in symptomatology

## 2018-01-23 ENCOUNTER — Ambulatory Visit: Payer: Medicare HMO | Admitting: Nurse Practitioner

## 2018-02-21 ENCOUNTER — Encounter: Payer: Self-pay | Admitting: Pulmonary Disease

## 2018-02-21 ENCOUNTER — Ambulatory Visit: Payer: Medicare HMO | Admitting: Pulmonary Disease

## 2018-02-21 VITALS — BP 100/60 | HR 40 | Ht 62.0 in | Wt 91.0 lb

## 2018-02-21 DIAGNOSIS — R0602 Shortness of breath: Secondary | ICD-10-CM | POA: Diagnosis not present

## 2018-02-21 NOTE — Patient Instructions (Addendum)
Congestive heart failure Abnormal chest x-ray-stable from previous  I will recommend to stop Lasix, only use it if symptomatic  I will recommend involving hospice  I will see you in the office as needed

## 2018-02-21 NOTE — Progress Notes (Signed)
[place]    [id number]    01-19-1924  Primary Care Physician:Eubanks, Carlos American, NP  Referring Physician: [name], NP [place], [place] [address]  Chief complaint:   No significant symptoms today She is being followed for shortness of breath, pleural effusions, abnormal chest x-ray  HPI:   Symptoms are relatively stable She is less active Despite a good appetite she is currently losing more weight Exercise tolerance has worsened since her last visit  Was recently treated for pneumonia end of June Recently lost her spouse of many years Multiple comorbidities-she has been feeling much better generally  Has lost some weight since her last visit Appetite is good  Issues with chronic back pain Diagnosis of Lyme's in the last couple years  Occupation:office work Exposures: No significant mold exposure Smoking history: Never smoker, was exposed to secondhand smoke  Outpatient Encounter Medications as of 02/21/2018  Medication Sig  . ELIQUIS 2.5 MG TABS tablet TAKE 1 TABLET BY MOUTH TWICE A DAY (Patient taking differently: Take 2.5 mg by mouth 2 (two) times daily. )  . furosemide (LASIX) 20 MG tablet Take 1 tablet (20 mg total) by mouth every other day. (Patient taking differently: Take 20 mg by mouth 2 (two) times a week. )  . levothyroxine (SYNTHROID, LEVOTHROID) 50 MCG tablet Take 50 mcg by mouth daily before breakfast. Must be NAME BRAND  . loratadine (CLARITIN) 10 MG tablet Take 10 mg by mouth daily.  . magnesium hydroxide (MILK OF MAGNESIA) 400 MG/5ML suspension Take 15 mLs by mouth daily as needed for mild constipation.  . metoprolol tartrate (LOPRESSOR) 25 MG tablet TAKE 0.5 TABLETS (12.5 MG TOTAL) BY MOUTH 2 (TWO) TIMES DAILY. (Patient taking differently: Take 12.5 mg by mouth See admin instructions. Take 1/2 tablet (12.5 mg) by mouth twice daily, may take one extra half tablet (12.5 mg) if needed for racing heart)  .  naphazoline-pheniramine (VISINE-A) 0.025-0.3 % ophthalmic solution Place 1 drop into both eyes 2 (two) times daily.   No facility-administered encounter medications on file as of 02/21/2018.     Allergies as of 02/21/2018 - Review Complete 02/21/2018  Allergen Reaction Noted  . Synthroid [levothyroxine] Other (See Comments) 06/08/2017  . Tylenol [acetaminophen] Other (See Comments) 08/25/2014    Past Medical History:  Diagnosis Date  . Anemia   . Atrial fibrillation, persistent 06/2015   Admitted with TIA. Diagnosed with A. fib. Rate control. On Eliquis.  . Compression fracture of T12 vertebra ([place])   . Hypothyroid   . Lyme disease 2017  . Stroke [facility]) 06/2015   -TIA    Past Surgical History:  Procedure Laterality Date  . ABDOMINAL HYSTERECTOMY  1968  . BLADDER SUSPENSION    . IR THORACENTESIS ASP PLEURAL SPACE W/IMG GUIDE  10/31/2017  . IR VERTEBROPLASTY CERV/THOR BX INC UNI/BIL INC/INJECT/IMAGING  03/23/2017  . IR VERTEBROPLASTY EA ADDL (T&LS) BX INC UNI/BIL INC INJECT/IMAGING  03/23/2017  . TRANSTHORACIC ECHOCARDIOGRAM  06/2015   In setting of TIA.  Moderate basal hypertrophy. Vigorous function with EF 65-70%. Mild aortic stenosis with mean gradient 11 mmHg. Severe MAC with mild MR. Moderate LA dilation.    Family History  Problem Relation Age of Onset  . Heart attack Cousin   . Cancer Mother 33       breast  . Pneumonia Father 63  . Transient ischemic attack Sister   . Atrial fibrillation Brother   . Hypertension Daughter   .  Hypertension Son   . Hypertension Son     Social History   Socioeconomic History  . Marital status: Widowed    Spouse name: Not on file  . Number of children: Not on file  . Years of education: Not on file  . Highest education level: Not on file  Occupational History  . Not on file  Social Needs  . Financial resource strain: Not on file  . Food insecurity:    Worry: Never true    Inability: Never true  . Transportation needs:     Medical: No    Non-medical: No  Tobacco Use  . Smoking status: Never Smoker  . Smokeless tobacco: Never Used  Substance and Sexual Activity  . Alcohol use: No  . Drug use: No  . Sexual activity: Never  Lifestyle  . Physical activity:    Days per week: 0 days    Minutes per session: 0 min  . Stress: To some extent  Relationships  . Social connections:    Talks on phone: More than three times a week    Gets together: Never    Attends religious service: Never    Active member of club or organization: No    Attends meetings of clubs or organizations: Never    Relationship status: Married  . Intimate partner violence:    Fear of current or ex partner: No    Emotionally abused: No    Physically abused: No    Forced sexual activity: No  Other Topics Concern  . Not on file  Social History Narrative   Social History      Diet?       Do you drink/eat things with caffeine? coffee      Marital status?       married                             What year were you married? 1947      Do you live in a house, apartment, assisted living, condo, trailer, etc.? house      Is it one or more stories? Yes-but does not go upstairs      How many persons live in your home? 3      Do you have any pets in your home? (please list) 1 dog      Highest level of education completed?      Current or past profession: Clinical cytogeneticist      Do you exercise?                                      Type & how often?      Advanced Directives      Do you have a living will? no      Do you have a DNR form?     no                             If not, do you want to discuss one? yes      Do you have signed POA/HPOA for forms?       Functional Status      Do you have difficulty bathing or dressing yourself?      Do you have difficulty preparing food or eating?  Do you have difficulty managing your medications?      Do you have difficulty managing your finances?      Do you have difficulty  affording your medications?    Review of systems: Review of Systems  Constitutional: No fever no chills HENT: Negative.  No nasal congestion Eyes: Negative for blurred vision.  Respiratory: Shortness of breath on exertion Cardiovascular: Negative for chest pain and palpitations.  No leg swelling  all other systems reviewed and are negative.  Physical Exam:  Vitals:   02/21/18 1108  BP: 100/60  Pulse: (!) 40  SpO2: 100%   Gen:      Frail, comfortable HEENT: Moist oral mucosa Neck:    No thyromegaly, no adenopathy Lungs:    Increased air movement at the bases bilaterally CV:         S1-S2 appreciated Abd:   Bowel sounds appreciated  Data Reviewed: .CT scan of the chest which was performed on 11/27/2017 reviewed by myself showing large pleural effusions bilaterally  .  Chest x-ray reviewed, stable  Assessment:  .  Abnormal CT scan of the chest showing a cavitary right upper lobe nodule/chest x-ray showing cavitary right upper lobe nodule -Has remained stable -No plans for invasive intervention  .  Bilateral pleural effusions Resolved on last chest x-ray -Stable status -Stable symptoms  .  Decompensated congestive heart failure -Appears to be better compensated, symptom wise -Has been using Lasix as needed, about once a week  .  Patient is very frail  .  I do believe this is end-stage disease process  .  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  We will discontinue Lasix  .  There is a real possibility of a neoplastic process however the patient is too frail for further invasive intervention  -she continues to do well No invasive intervention desired  .  If she gets more short of breath, she should call us and we will plan for thoracentesis as an outpatient-there is no significant pleural fluid on the most recent chest x-ray  I will see her back in 6 weeks, she was offered to come in just as needed.  Appointment made for 6  weeks  I did discuss involvement of hospice Will defer to her primary care doctor to initiate hospice care  Sherrilyn Rist MD [facility] 02/21/2018, 12:20 PM  CC: [name], NP

## 2018-02-26 ENCOUNTER — Ambulatory Visit (INDEPENDENT_AMBULATORY_CARE_PROVIDER_SITE_OTHER): Payer: Medicare HMO

## 2018-02-26 VITALS — BP 110/60 | HR 68 | Ht 62.0 in | Wt 93.8 lb

## 2018-02-26 DIAGNOSIS — Z0000 Encounter for general adult medical examination without abnormal findings: Secondary | ICD-10-CM | POA: Diagnosis not present

## 2018-02-26 NOTE — Patient Instructions (Addendum)
[name] , Thank you for taking time to come for your Medicare Wellness Visit. I appreciate your ongoing commitment to your health goals. Please review the following plan we discussed and let me know if I can assist you in the future.   Screening recommendations/referrals: Colonoscopy: No longer required Mammogram: No longer rquired Bone Density: Declined Recommended yearly ophthalmology/optometry visit for glaucoma screening and checkup Recommended yearly dental visit for hygiene and checkup  Vaccinations: Influenza vaccine: Up to date Pneumococcal vaccine: Due for Pnuemovax 23, declined Tdap vaccine: Due, declined Shingles vaccine: Up to date    Advanced directives: Please bring a copy of your POA (Power of [place]) and/or Living Will to your next appointment.    Conditions/risks identified: underweight, recommend increasing your caloric intake.  Next appointment: Follow up in 1 year for your annual wellness visit.    Preventive Care 6065 Years and Older, Female Preventive care refers to lifestyle choices and visits with your health care provider that can promote health and wellness. What does preventive care include?  A yearly physical exam. This is also called an annual well check.  Dental exams once or twice a year.  Routine eye exams. Ask your health care provider how often you should have your eyes checked.  Personal lifestyle choices, including:  Daily care of your teeth and gums.  Regular physical activity.  Eating a healthy diet.  Avoiding tobacco and drug use.  Limiting alcohol use.  Practicing safe sex.  Taking low-dose aspirin every day.  Taking vitamin and mineral supplements as recommended by your health care provider. What happens during an annual well check? The services and screenings done by your health care provider during your annual well check will depend on your age, overall health, lifestyle risk factors, and family history of  disease. Counseling  Your health care provider may ask you questions about your:  Alcohol use.  Tobacco use.  Drug use.  Emotional well-being.  Home and relationship well-being.  Sexual activity.  Eating habits.  History of falls.  Memory and ability to understand (cognition).  Work and work Astronomerenvironment.  Reproductive health. Screening  You may have the following tests or measurements:  Height, weight, and BMI.  Blood pressure.  Lipid and cholesterol levels. These may be checked every 5 years, or more frequently if you are over 82 years old.  Skin check.  Lung cancer screening. You may have this screening every year starting at age 82 if you have a 30-pack-year history of smoking and currently smoke or have quit within the past 15 years.  Fecal occult blood test (FOBT) of the stool. You may have this test every year starting at age 82.  Flexible sigmoidoscopy or colonoscopy. You may have a sigmoidoscopy every 5 years or a colonoscopy every 10 years starting at age 82.  Hepatitis C blood test.  Hepatitis B blood test.  Sexually transmitted disease (STD) testing.  Diabetes screening. This is done by checking your blood sugar (glucose) after you have not eaten for a while (fasting). You may have this done every 1-3 years.  Bone density scan. This is done to screen for osteoporosis. You may have this done starting at age 82.  Mammogram. This may be done every 1-2 years. Talk to your health care provider about how often you should have regular mammograms. Talk with your health care provider about your test results, treatment options, and if necessary, the need for more tests. Vaccines  Your health care provider may recommend certain  vaccines, such as:  Influenza vaccine. This is recommended every year.  Tetanus, diphtheria, and acellular pertussis (Tdap, Td) vaccine. You may need a Td booster every 10 years.  Zoster vaccine. You may need this after age  59.  Pneumococcal 13-valent conjugate (PCV13) vaccine. One dose is recommended after age 59.  Pneumococcal polysaccharide (PPSV23) vaccine. One dose is recommended after age 21. Talk to your health care provider about which screenings and vaccines you need and how often you need them. This information is not intended to replace advice given to you by your health care provider. Make sure you discuss any questions you have with your health care provider. Document Released: 04/03/2015 Document Revised: 11/25/2015 Document Reviewed: 01/06/2015 Elsevier Interactive Patient Education  2017 [place] Prevention in the Home Falls can cause injuries. They can happen to people of all ages. There are many things you can do to make your home safe and to help prevent falls. What can I do on the outside of my home?  Regularly fix the edges of walkways and driveways and fix any cracks.  Remove anything that might make you trip as you walk through a door, such as a raised step or threshold.  Trim any bushes or trees on the path to your home.  Use bright outdoor lighting.  Clear any walking paths of anything that might make someone trip, such as rocks or tools.  Regularly check to see if handrails are loose or broken. Make sure that both sides of any steps have handrails.  Any raised decks and porches should have guardrails on the edges.  Have any leaves, snow, or ice cleared regularly.  Use sand or salt on walking paths during winter.  Clean up any spills in your garage right away. This includes oil or grease spills. What can I do in the bathroom?  Use night lights.  Install grab bars by the toilet and in the tub and shower. Do not use towel bars as grab bars.  Use non-skid mats or decals in the tub or shower.  If you need to sit down in the shower, use a plastic, non-slip stool.  Keep the floor dry. Clean up any water that spills on the floor as soon as it happens.  Remove  soap buildup in the tub or shower regularly.  Attach bath mats securely with double-sided non-slip rug tape.  Do not have throw rugs and other things on the floor that can make you trip. What can I do in the bedroom?  Use night lights.  Make sure that you have a light by your bed that is easy to reach.  Do not use any sheets or blankets that are too big for your bed. They should not hang down onto the floor.  Have a firm chair that has side arms. You can use this for support while you get dressed.  Do not have throw rugs and other things on the floor that can make you trip. What can I do in the kitchen?  Clean up any spills right away.  Avoid walking on wet floors.  Keep items that you use a lot in easy-to-reach places.  If you need to reach something above you, use a strong step stool that has a grab bar.  Keep electrical cords out of the way.  Do not use floor polish or wax that makes floors slippery. If you must use wax, use non-skid floor wax.  Do not have throw rugs and other things  on the floor that can make you trip. What can I do with my stairs?  Do not leave any items on the stairs.  Make sure that there are handrails on both sides of the stairs and use them. Fix handrails that are broken or loose. Make sure that handrails are as long as the stairways.  Check any carpeting to make sure that it is firmly attached to the stairs. Fix any carpet that is loose or worn.  Avoid having throw rugs at the top or bottom of the stairs. If you do have throw rugs, attach them to the floor with carpet tape.  Make sure that you have a light switch at the top of the stairs and the bottom of the stairs. If you do not have them, ask someone to add them for you. What else can I do to help prevent falls?  Wear shoes that:  Do not have high heels.  Have rubber bottoms.  Are comfortable and fit you well.  Are closed at the toe. Do not wear sandals.  If you use a  stepladder:  Make sure that it is fully opened. Do not climb a closed stepladder.  Make sure that both sides of the stepladder are locked into place.  Ask someone to hold it for you, if possible.  Clearly mark and make sure that you can see:  Any grab bars or handrails.  First and last steps.  Where the edge of each step is.  Use tools that help you move around (mobility aids) if they are needed. These include:  Canes.  Walkers.  Scooters.  Crutches.  Turn on the lights when you go into a dark area. Replace any light bulbs as soon as they burn out.  Set up your furniture so you have a clear path. Avoid moving your furniture around.  If any of your floors are uneven, fix them.  If there are any pets around you, be aware of where they are.  Review your medicines with your doctor. Some medicines can make you feel dizzy. This can increase your chance of falling. Ask your doctor what other things that you can do to help prevent falls. This information is not intended to replace advice given to you by your health care provider. Make sure you discuss any questions you have with your health care provider. Document Released: 01/01/2009 Document Revised: 08/13/2015 Document Reviewed: 04/11/2014 Elsevier Interactive Patient Education  2017 [employer].  No

## 2018-02-26 NOTE — Progress Notes (Signed)
Subjective:   Caitlyn Henry is a 84 y.o. female who presents for Medicare Annual (Subsequent) preventive examination.  Review of Systems:  N/A Cardiac Risk Factors include: advanced age (>33mn, >>76women);sedentary lifestyle     Objective:     Vitals: BP 110/60   Pulse 68   Ht 5' 2"  (1.575 m)   Wt 93 lb 12.8 oz (42.5 kg)   BMI 17.16 kg/m   Body mass index is 17.16 kg/m.  Advanced Directives 02/26/2018 09/19/2017 04/24/2017 03/23/2017 03/09/2017 02/24/2017 02/15/2017  Does Patient Have a Medical Advance Directive? Yes Yes Yes No Yes Yes No  Type of AParamedicof [place] will [place]of facility DNR (pink MOST or yellow form) - Out of facility DNR (pink MOST or yellow form) Out of facility DNR (pink MOST or yellow form) -  Does patient want to make changes to medical advance directive? No - Patient declined - No - Patient declined - - - -  Copy of [place] Chart? No - copy requested No - copy requested - - - - -  Would patient like information on creating a medical advance directive? - - - No - Patient declined - - Yes (ED - Information included in AVS)  Pre-existing out of facility DNR order (yellow form or pink MOST form) - - Yellow form placed in chart (order not valid for inpatient use) - Yellow form placed in chart (order not valid for inpatient use) Yellow form placed in chart (order not valid for inpatient use);Pink MOST form placed in chart (order not valid for inpatient use) -    Tobacco Social History   Tobacco Use  Smoking Status Never Smoker  Smokeless Tobacco Never Used     Counseling given: Not Answered   Clinical Intake:  Pre-visit preparation completed: Yes  Pain : No/denies pain     Nutritional Status: BMI <19  Underweight Nutritional Risks: None Diabetes: No  How often do you need to have someone help you when you read instructions, pamphlets, or other written materials  from your doctor or pharmacy?: 2 - Rarely What is the last grade level you completed in school?: 1 year of college  Interpreter Needed?: No  Information entered by :: KHarrison, LPN  Past Medical History:  Diagnosis Date  . Anemia   . Atrial fibrillation, persistent 06/2015   Admitted with TIA. Diagnosed with A. fib. Rate control. On Eliquis.  . [place]   . Hypothyroid   . Lyme disease 2017  . Stroke ([facility] 06/2015   -TIA   Past Surgical History:  Procedure Laterality Date  . ABDOMINAL HYSTERECTOMY  1968  . BLADDER SUSPENSION    . IR THORACENTESIS ASP PLEURAL SPACE W/IMG GUIDE  10/31/2017  . IR VERTEBROPLASTY CERV/THOR BX INC UNI/BIL INC/INJECT/IMAGING  03/23/2017  . IR VERTEBROPLASTY EA ADDL (T&LS) BX INC UNI/BIL INC INJECT/IMAGING  03/23/2017  . TRANSTHORACIC ECHOCARDIOGRAM  06/2015   In setting of TIA.  Moderate basal hypertrophy. Vigorous function with EF 65-70%. Mild aortic stenosis with mean gradient 11 mmHg. Severe MAC with mild MR. Moderate LA dilation.   Family History  Problem Relation Age of Onset  . Heart attack Cousin   . Cancer Mother [phone]      breast  . Pneumonia Father [phone] . Transient ischemic attack Sister   . Atrial fibrillation Brother   . Hypertension Daughter   . Hypertension Son   .  Hypertension Son    Social History   Socioeconomic History  . Marital status: Widowed    Spouse name: Not on file  . Number of children: Not on file  . Years of education: Not on file  . Highest education level: Not on file  Occupational History  . Not on file  Social Needs  . Financial resource strain: Not hard at all  . Food insecurity:    Worry: Never true    Inability: Never true  . Transportation needs:    Medical: No    Non-medical: No  Tobacco Use  . Smoking status: Never Smoker  . Smokeless tobacco: Never Used  Substance and Sexual Activity  . Alcohol use: No  . Drug use: No  . Sexual activity: Not Currently    Lifestyle  . Physical activity:    Days per week: 0 days    Minutes per session: 0 min  . Stress: To some extent  Relationships  . Social connections:    Talks on phone: More than three times a week    Gets together: Never    Attends religious service: Never    Active member of club or organization: No    Attends meetings of clubs or organizations: Never    Relationship status: Married  Other Topics Concern  . Not on file  Social History Narrative  . Not on file    Outpatient Encounter Medications as of 02/26/2018  Medication Sig  . ELIQUIS 2.5 MG TABS tablet TAKE 1 TABLET BY MOUTH TWICE A DAY (Patient taking differently: Take 2.5 mg by mouth 2 (two) times daily. )  . furosemide (LASIX) 20 MG tablet Take 1 tablet (20 mg total) by mouth every other day. (Patient taking differently: Take 20 mg by mouth 2 (two) times a week. )  . levothyroxine (SYNTHROID, LEVOTHROID) 50 MCG tablet Take 50 mcg by mouth daily before breakfast. Must be NAME BRAND  . loratadine (CLARITIN) 10 MG tablet Take 10 mg by mouth daily.  . magnesium hydroxide (MILK OF MAGNESIA) 400 MG/5ML suspension Take 15 mLs by mouth daily as needed for mild constipation.  . metoprolol tartrate (LOPRESSOR) 25 MG tablet TAKE 0.5 TABLETS (12.5 MG TOTAL) BY MOUTH 2 (TWO) TIMES DAILY. (Patient taking differently: Take 12.5 mg by mouth See admin instructions. Take 1/2 tablet (12.5 mg) by mouth twice daily, may take one extra half tablet (12.5 mg) if needed for racing heart)  . naphazoline-pheniramine (VISINE-A) 0.025-0.3 % ophthalmic solution Place 1 drop into both eyes 2 (two) times daily.   No facility-administered encounter medications on file as of 02/26/2018.     Activities of Daily Living In your present state of health, do you have any difficulty performing the following activities: 02/26/2018  Hearing? N  Vision? Y  Comment Has cataracts but risks of the surgery out weigh the benefits  Difficulty concentrating or making  decisions? Y  Comment occasional forgetfulness   Walking or climbing stairs? Y  Dressing or bathing? N  Doing errands, shopping? Y  Preparing Food and eating ? Y  Using the Toilet? N  In the past six months, have you accidently leaked urine? Y  Comment Occasionally  Do you have problems with loss of bowel control? N  Managing your Medications? Y  Managing your Finances? Y  Housekeeping or managing your Housekeeping? Y  Some recent data might be hidden    Patient Care Team: [name], NP as PCP - General (Geriatric Medicine) [name],  NP as PCP - Internal Medicine (Geriatric Medicine) Leonie Man, MD as PCP - Cardiology (Cardiology) Glennie Isle, PA-C as Consulting Physician (Physician Assistant) Leonie Man, MD as Consulting Physician (Cardiology)    Assessment:   This is a routine wellness examination for [name].  Exercise Activities and Dietary recommendations Current Exercise Habits: The patient does not participate in regular exercise at present, Exercise limited by: orthopedic condition(s)  Goals    . Gain weight     Recommend increasing caloric intake       Fall Risk Fall Risk  02/26/2018 10/16/2017 09/19/2017 06/06/2017 04/24/2017  Falls in the past year? 1 Yes Yes No No  Number falls in past yr: 1 - 2 or more - -  Comment - - - - -  Injury with Fall? 0 Yes Yes - -  Comment - - - - -  Risk Factor Category  - - - - -  Risk for fall due to : History of fall(s);Impaired balance/gait;Impaired mobility - - - -  Follow up Falls prevention discussed;Falls evaluation completed - - - -   Is the patient's home free of loose throw rugs in walkways, pet beds, electrical cords, etc?   yes      Grab bars in the bathroom? yes      Handrails on the stairs?   yes      Adequate lighting?   yes  Timed Get Up and Go performed: N/A  Depression Screen PHQ 2/9 Scores 02/26/2018 10/16/2017 04/24/2017 02/15/2017  PHQ - 2 Score 1 0 0 0     Cognitive  Function MMSE - Mini Mental State Exam 02/26/2018 02/15/2017  Orientation to time 4 4  Orientation to Place 4 5  Registration 3 3  Attention/ Calculation 5 5  Recall 1 1  Language- name 2 objects 2 2  Language- repeat 0 1  Language- follow 3 step command 3 3  Language- read & follow direction 1 1  Write a sentence 1 1  Copy design 1 1  Total score 25 27        Immunization History  Administered Date(s) Administered  . Influenza, High Dose Seasonal PF 01/09/2017, 01/10/2018  . Influenza-Unspecified 11/19/2015  . Pneumococcal Conjugate-13 02/15/2017    Qualifies for Shingles Vaccine? Yes, up to date  Screening Tests Health Maintenance  Topic Date Due  . TETANUS/TDAP  08/16/1942  . PNA vac Low Risk Adult (2 of 2 - PPSV23) 02/15/2018  . DEXA SCAN  02/27/2019 (Originally 08/17/1990)  . INFLUENZA VACCINE  Completed    Cancer Screenings: Lung: Low Dose CT Chest recommended if Age 71-80 years, 30 pack-year currently smoking OR have quit w/in 15years. Patient does not qualify. Breast:  Up to date on Mammogram? Yes   Up to date of Bone Density/Dexa? No Colorectal: No longer required  Additional Screenings: Hepatitis C Screening: Declined     Plan:      I have personally reviewed and noted the following in the patient's chart:   . Medical and social history . Use of alcohol, tobacco or illicit drugs  . Current medications and supplements . Functional ability and status . Nutritional status . Physical activity . Advanced directives . List of other physicians . Hospitalizations, surgeries, and ER visits in previous 12 months . Vitals . Screenings to include cognitive, depression, and falls . Referrals and appointments  In addition, I have reviewed and discussed with patient certain preventive protocols, quality metrics, and best practice recommendations. A written  personalized care plan for preventive services as well as general preventive health recommendations were  provided to patient.     Stormy Fabian, LPN  [id number]   Quick Notes   Health Maintenance: Due for Pneumovax 23, and TDAP. Patient declined today due to not wanting to be sore from the injection.     Abnormal Screen: MMSE 25/30. Failed Clock draw test     Patient Concerns:  None     Nurse Concerns: Underweight, recommend increasing caloric intake.

## 2018-03-25 ENCOUNTER — Other Ambulatory Visit: Payer: Self-pay | Admitting: Cardiology

## 2018-03-25 DIAGNOSIS — I4891 Unspecified atrial fibrillation: Secondary | ICD-10-CM

## 2018-03-25 DIAGNOSIS — I10 Essential (primary) hypertension: Secondary | ICD-10-CM

## 2018-03-29 ENCOUNTER — Encounter: Payer: Self-pay | Admitting: Cardiology

## 2018-03-29 ENCOUNTER — Ambulatory Visit: Payer: Medicare HMO | Admitting: Cardiology

## 2018-03-29 VITALS — BP 136/78 | HR 72 | Ht 62.0 in | Wt 97.2 lb

## 2018-03-29 DIAGNOSIS — R600 Localized edema: Secondary | ICD-10-CM

## 2018-03-29 DIAGNOSIS — I4819 Other persistent atrial fibrillation: Secondary | ICD-10-CM | POA: Diagnosis not present

## 2018-03-29 DIAGNOSIS — I10 Essential (primary) hypertension: Secondary | ICD-10-CM | POA: Diagnosis not present

## 2018-03-29 MED ORDER — FUROSEMIDE 20 MG PO TABS: ORAL_TABLET | ORAL | 6 refills | Status: AC

## 2018-03-29 MED ORDER — FUROSEMIDE 20 MG PO TABS: ORAL_TABLET | ORAL | 6 refills | Status: DC

## 2018-03-29 NOTE — Progress Notes (Signed)
PCP: Caitlyn Chandler, NP  Clinic Note: Chief Complaint  Patient presents with  . Follow-up    Overall doing okay  . Atrial Fibrillation    Stable.    HPI: Caitlyn Henry is a 85 y.o. female with a PMH below who presents today for 25-monthfollow-up for persistent A. fib/permanent A. fib. [facility] and decreased responsiveness.  She carries a diagnosis of Lyme disease that has been treated but still she has symptoms.  When she presented with TIA symptoms, she was in A. fib. The working diagnosis was that she had an embolic TIA related to her A. fib. She was relatively unaware of being in A. fib at that time May 16, 2017.  She was doing fairly well.  Was dealing with some back pain from a fall that it happened earlier that month.  Otherwise doing fine.  No real chest pain or dyspnea.  [place] in [place] 2019 was doing relatively well.  Every now then notes fast heart rate spells.  But no real prolonged episodes.  She does note some chest discomfort when her heart rate goes fast.  Notes unsteady balance with some dizziness.  No syncope or near syncope.  We tried a dose of 20 mg as needed Lasix.  Recent Hospitalizations:   Not hospitalized, but outpatient thoracentesis  September 2019 pleural effusion evaluation  Studies Personally Reviewed - (if available, images/films reviewed: From Epic Chart or Care Everywhere)  None  Interval History: Since I last saw Caitlyn she is doing fairly well.  She has her good and bad days.  But for the most part is better than good.  Has globalized weakness, but still gets about a little bit.  She had a weak spell last week where she felt like she is about ready to give out or pass out, but did not actually pass out.  But otherwise the rest week she is doing fine.  She is occasionally feels lightheaded and dizzy and that oftentimes when she stands up.  But also she does  has really poor balance when she walks, 10 tends to drift to one side versus the other.  She is easily fatigued, but denies any real significant exertional dyspnea.  She just really does not do a lot.  She denies any chest pressure with rest or exertion.  No PND, orthopnea with mild pedal edema.  Her daughter think she probably needs to use her Lasix more frequently.  She just is reluctant to do so because of frequent urination and difficulty with that.  Occasional fast heart spells, but for the most part nothing significant.  No syncope although she has felt lightheaded.  No TIA or amaurosis fugax. No bleeding issues.  Still notes occasional exertional dyspnea and occasional palpitations.  Rarely has PND orthopnea but no edema.  No claudication.  ROS: A comprehensive was performed. Review of Systems  Constitutional: Positive for malaise/fatigue (Easily fatigued.).  HENT: Negative for congestion and nosebleeds.   Eyes: Negative.   Respiratory: Negative for cough, shortness of breath and wheezing.   Cardiovascular: Positive for palpitations (Per HPI. Nothing to suggest awareness of A. fib.). Negative for chest pain (She still has some soreness from falls).  Gastrointestinal: Positive for constipation. Negative for abdominal pain, blood in stool, heartburn and melena.  Genitourinary: Negative for frequency and hematuria.  Musculoskeletal: Positive for back pain (Related to her fall with spine fracture --has definitely improved.). Negative  for falls (Almost fell last week, but has not otherwise had a fall since I last saw her.).  Neurological: Positive for dizziness (But not recently) and weakness (Global). Negative for focal weakness and headaches.  Psychiatric/Behavioral: Positive for memory loss. The patient is nervous/anxious.   All other systems reviewed and are negative.  I have reviewed and (if needed) personally updated the patient's problem list, medications, allergies, past medical and  surgical history, social and family history.   Past Medical History:  Diagnosis Date  . Anemia   . Atrial fibrillation, persistent 06/2015   Admitted with TIA. Diagnosed with A. fib. Rate control. On Eliquis.  . Compression fracture of T12 vertebra ([place])   . Hypothyroid   . Lyme disease 2017  . Stroke [facility]) 06/2015   -TIA    Past Surgical History:  Procedure Laterality Date  . ABDOMINAL HYSTERECTOMY  1968  . BLADDER SUSPENSION    . IR THORACENTESIS ASP PLEURAL SPACE W/IMG GUIDE  10/31/2017  . IR VERTEBROPLASTY CERV/THOR BX INC UNI/BIL INC/INJECT/IMAGING  03/23/2017  . IR VERTEBROPLASTY EA ADDL (T&LS) BX INC UNI/BIL INC INJECT/IMAGING  03/23/2017  . TRANSTHORACIC ECHOCARDIOGRAM  06/2015   In setting of TIA.  Moderate basal hypertrophy. Vigorous function with EF 65-70%. Mild aortic stenosis with mean gradient 11 mmHg. Severe MAC with mild MR. Moderate LA dilation.    Current Meds  Medication Sig  . ELIQUIS 2.5 MG TABS tablet TAKE 1 TABLET BY MOUTH TWICE A DAY  . levothyroxine (SYNTHROID, LEVOTHROID) 50 MCG tablet Take 50 mcg by mouth daily before breakfast. Must be NAME BRAND  . loratadine (CLARITIN) 10 MG tablet Take 10 mg by mouth daily.  . magnesium hydroxide (MILK OF MAGNESIA) 400 MG/5ML suspension Take 15 mLs by mouth daily as needed for mild constipation.  . metoprolol tartrate (LOPRESSOR) 25 MG tablet TAKE 0.5 TABLETS (12.5 MG TOTAL) BY MOUTH 2 (TWO) TIMES DAILY. (Patient taking differently: Take 12.5 mg by mouth See admin instructions. Take 1/2 tablet (12.5 mg) by mouth twice daily, may take one extra half tablet (12.5 mg) if needed for racing heart)  . naphazoline-pheniramine (VISINE-A) 0.025-0.3 % ophthalmic solution Place 1 drop into both eyes 2 (two) times daily.  . naproxen sodium (ALEVE) 220 MG tablet Take 220 mg by mouth.  . [DISCONTINUED] furosemide (LASIX) 20 MG tablet Take 1 tablet (20 mg total) by mouth every other day. (Patient taking differently: Take 20 mg by mouth 2  (two) times a week. )  . [DISCONTINUED] furosemide (LASIX) 20 MG tablet TAKE 20 MG  OF LASIX ONCE A WEEK    Allergies  Allergen Reactions  . Synthroid [Levothyroxine] Other (See Comments)    Generic causes headache/ must be name brand Synthroid  . Tylenol [Acetaminophen] Other (See Comments)    Makes my body feel numb     Social History   Tobacco Use  . Smoking status: Never Smoker  . Smokeless tobacco: Never Used  Substance Use Topics  . Alcohol use: No  . Drug use: No   family history includes Atrial fibrillation in her brother; Cancer (age of onset: 8) in her mother; Heart attack in her cousin; Hypertension in her daughter, son, and son; Pneumonia (age of onset: 16) in her father; Transient ischemic attack in her sister.  Wt Readings from Last 3 Encounters:  03/29/18 97 lb 3.2 oz (44.1 kg)  02/26/18 93 lb 12.8 oz (42.5 kg)  02/21/18 91 lb (41.3 kg)    PHYSICAL EXAM BP 136/78  Pulse 72   Ht _0  (1.575 m)   Wt 97 lb 3.2 oz (44.1 kg)   BMI 17.78 kg/m   Physical Exam  Constitutional: She is oriented to person, place, and time. No distress.  Thin, frail elderly woman who appears appears borderline cachectic, but not emaciated.    HENT:  Head: Normocephalic and atraumatic.  Neck: Normal range of motion. Neck supple. No hepatojugular reflux and no JVD present. Carotid bruit is not present.  Cardiovascular: Normal rate and intact distal pulses. An irregularly irregular rhythm present. Exam reveals no gallop and no friction rub.  Murmur heard.  Medium-pitched harsh crescendo-decrescendo midsystolic murmur is present with a grade of 2/6 at the upper right sternal border radiating to the neck. Pulmonary/Chest: Breath sounds normal. No respiratory distress. She has no wheezes. She has no rales. She exhibits tenderness (Thoracic rib and vertebrae pain).  Mildly diminished breath sounds at the bases with poor respiratory effort.  Musculoskeletal: Normal range of motion.         General: No edema.     Comments: Thoracic kyphosis  Neurological: She is alert and oriented to person, place, and time.  Psychiatric: She has a normal mood and affect. Her behavior is normal.  She does answer questions, but oftentimes defers to her daughter.  Relatively poor memory  Vitals reviewed.   Adult ECG Report  Rate: 85;  Rhythm: atrial fibrillation and Cannot exclude septal MI, age undetermined.  Otherwise normal axis, intervals and durations with nonspecific ST and T wave changes.;   Narrative Interpretation: Stable EKG   Other studies Reviewed: Additional studies/ records that were reviewed today include:  Recent Labs:   Lab Results  Component Value Date   CREATININE 0.95 01/10/2018   BUN 28 (H) 01/10/2018   NA 132 (L) 01/10/2018   K 4.5 01/10/2018   CL 97 01/10/2018   CO2 29 01/10/2018    ASSESSMENT / PLAN: Problem List Items Addressed This Visit    Edema (Chronic)    Intermittent edema.  I think she needs to take at least once a week Lasix.  She would like to take a full dose because of frequent urination, we will therefore have her take a half a tablet at least once a week.  Then okay to take additional half tablet to full tablet depending on swelling.  This way hopefully she will not get volume overloaded.      Essential hypertension (Chronic)    Stable blood pressure.  Allowing for permissive hypertension.  Tolerating minimal dose beta-blocker.  Okay to take additional doses for tachycardia spells.  Otherwise would not push further. I think she will should be a tolerate intermittent dose of Lasix.  At least 1/2 tablet once a week and as needed.      Relevant Medications   furosemide (LASIX) 20 MG tablet   Persistent atrial fibrillation (HCC) -- CHA2DS2-VASc Score 6, on Low Dose Eliquis (Chronic)    Pretty much rate controlled on low-dose beta-blocker.  Has not had to use additional PRN doses of beta-blocker although has been approved. Caitlyn Henry is on Eliquis at  low-dose with no bleeding. Otherwise stable.      Relevant Medications   furosemide (LASIX) 20 MG tablet      Current medicines are reviewed at length with the patient today. (+/- concerns)  -none The following changes have been made:No changes.  Okay to use as needed additional beta-blocker  Patient Instructions  Medication Instructions:   TAKE 1/2 TABLET OF  20 MG LASIX ONCE A WEEK ONLY  If you need a refill on your cardiac medications before your next appointment, please call your pharmacy.   Lab work: NOT  NEEDED If you have labs (blood work) drawn today and your tests are completely normal, you will receive your results only by: Marland Kitchen MyChart Message (if you have MyChart) OR . A paper copy in the mail If you have any lab test that is abnormal or we need to change your treatment, we will call you to review the results.  Testing/Procedures: NOT NEEDED  Follow-Up: At [facility], you and your health needs are our priority.  As part of our continuing mission to provide you with exceptional heart care, we have created designated Provider Care Teams.  These Care Teams include your primary Cardiologist (physician) and Advanced Practice Providers (APPs -  Physician Assistants and Nurse Practitioners) who all work together to provide you with the care you need, when you need it. You will need a follow up appointment in 6 months.  Please call our office 2 months in advance to schedule this appointment.  You may see Glenetta Hew, MD or one of the following Advanced Practice Providers on your designated Care Team:   Rosaria Ferries, PA-C . Jory Sims, DNP, ANP  Any Other Special Instructions Will Be Listed Below (If Applicable). NOT NEEDED   Studies Ordered:   No orders of the defined types were placed in this encounter.     Glenetta Hew, M.D., M.S. Interventional Cardiologist   Pager # [phone] Phone # [phone] [address] [place], [place]  [address]

## 2018-03-29 NOTE — Patient Instructions (Addendum)
Medication Instructions:   TAKE 1/2 TABLET OF 20 MG LASIX ONCE A WEEK ONLY  If you need a refill on your cardiac medications before your next appointment, please call your pharmacy.   Lab work: NOT  NEEDED If you have labs (blood work) drawn today and your tests are completely normal, you will receive your results only by: Marland Kitchen MyChart Message (if you have MyChart) OR . A paper copy in the mail If you have any lab test that is abnormal or we need to change your treatment, we will call you to review the results.  Testing/Procedures: NOT NEEDED  Follow-Up: At [facility], you and your health needs are our priority.  As part of our continuing mission to provide you with exceptional heart care, we have created designated Provider Care Teams.  These Care Teams include your primary Cardiologist (physician) and Advanced Practice Providers (APPs -  Physician Assistants and Nurse Practitioners) who all work together to provide you with the care you need, when you need it. You will need a follow up appointment in 6 months.  Please call our office 2 months in advance to schedule this appointment.  You may see Bryan Lemma, MD or one of the following Advanced Practice Providers on your designated Care Team:   Theodore Demark, PA-C . Joni Reining, DNP, ANP  Any Other Special Instructions Will Be Listed Below (If Applicable). NOT NEEDED

## 2018-04-01 ENCOUNTER — Encounter: Payer: Self-pay | Admitting: Cardiology

## 2018-04-01 NOTE — Assessment & Plan Note (Signed)
Intermittent edema.  I think she needs to take at least once a week Lasix.  She would like to take a full dose because of frequent urination, we will therefore have her take a half a tablet at least once a week.  Then okay to take additional half tablet to full tablet depending on swelling.  This way hopefully she will not get volume overloaded.

## 2018-04-01 NOTE — Assessment & Plan Note (Signed)
Stable blood pressure.  Allowing for permissive hypertension.  Tolerating minimal dose beta-blocker.  Okay to take additional doses for tachycardia spells.  Otherwise would not push further. I think she will should be a tolerate intermittent dose of Lasix.  At least 1/2 tablet once a week and as needed.

## 2018-04-01 NOTE — Assessment & Plan Note (Signed)
Pretty much rate controlled on low-dose beta-blocker.  Has not had to use additional PRN doses of beta-blocker although has been approved. He is on Eliquis at low-dose with no bleeding. Otherwise stable.

## 2018-04-04 ENCOUNTER — Ambulatory Visit: Payer: Medicare HMO | Admitting: Pulmonary Disease

## 2018-04-04 ENCOUNTER — Encounter: Payer: Self-pay | Admitting: Pulmonary Disease

## 2018-04-04 VITALS — BP 114/62 | HR 71 | Ht 62.0 in | Wt 100.0 lb

## 2018-04-04 DIAGNOSIS — R0602 Shortness of breath: Secondary | ICD-10-CM | POA: Diagnosis not present

## 2018-04-04 NOTE — Patient Instructions (Signed)
History of congestive heart failure, pleural effusion, shortness of breath Stable status  Lasix as discussed Continue to try and stay very active I will see you back in the office in 2 months

## 2018-04-04 NOTE — Progress Notes (Signed)
[place]    [id number]    12-14-1923  Primary Care Physician:Eubanks, Carlos American, NP  Referring Physician: [name], NP [place], [place] [address]  Chief complaint:   No significant symptoms today Has been doing relatively well She is being followed for shortness of breath, pleural effusions, abnormal chest x-ray  HPI:   Symptoms are relatively stable Shortness of breath with activity She is less active Weight is remained stable, concerned about recent acute weight gain-questioning whether it may be water related Exercise tolerance has remained stable   Has lost some weight since her last visit Appetite is good  Issues with chronic back pain Diagnosis of Lyme's in the last couple years  Occupation:office work Exposures: No significant mold exposure Smoking history: Never smoker, was exposed to secondhand smoke  Outpatient Encounter Medications as of 04/04/2018  Medication Sig  . ELIQUIS 2.5 MG TABS tablet TAKE 1 TABLET BY MOUTH TWICE A DAY  . furosemide (LASIX) 20 MG tablet TAKE 10 MG ( 1/2 TABLET OF 20 MG)  ONCE A WEEK  . levothyroxine (SYNTHROID, LEVOTHROID) 50 MCG tablet Take 50 mcg by mouth daily before breakfast. Must be NAME BRAND  . loratadine (CLARITIN) 10 MG tablet Take 10 mg by mouth daily.  . magnesium hydroxide (MILK OF MAGNESIA) 400 MG/5ML suspension Take 15 mLs by mouth daily as needed for mild constipation.  . metoprolol tartrate (LOPRESSOR) 25 MG tablet TAKE 0.5 TABLETS (12.5 MG TOTAL) BY MOUTH 2 (TWO) TIMES DAILY. (Patient taking differently: Take 12.5 mg by mouth See admin instructions. Take 1/2 tablet (12.5 mg) by mouth twice daily, may take one extra half tablet (12.5 mg) if needed for racing heart)  . naphazoline-pheniramine (VISINE-A) 0.025-0.3 % ophthalmic solution Place 1 drop into both eyes 2 (two) times daily.  . naproxen sodium (ALEVE) 220 MG tablet Take 220 mg by mouth.   No facility-administered  encounter medications on file as of 04/04/2018.     Allergies as of 04/04/2018 - Review Complete 04/04/2018  Allergen Reaction Noted  . Synthroid [levothyroxine] Other (See Comments) 06/08/2017  . Tylenol [acetaminophen] Other (See Comments) 08/25/2014    Past Medical History:  Diagnosis Date  . Anemia   . Atrial fibrillation, persistent 06/2015   Admitted with TIA. Diagnosed with A. fib. Rate control. On Eliquis.  . Compression fracture of T12 vertebra ([place])   . Hypothyroid   . Lyme disease 2017  . Stroke [facility]) 06/2015   -TIA    Past Surgical History:  Procedure Laterality Date  . ABDOMINAL HYSTERECTOMY  1968  . BLADDER SUSPENSION    . IR THORACENTESIS ASP PLEURAL SPACE W/IMG GUIDE  10/31/2017  . IR VERTEBROPLASTY CERV/THOR BX INC UNI/BIL INC/INJECT/IMAGING  03/23/2017  . IR VERTEBROPLASTY EA ADDL (T&LS) BX INC UNI/BIL INC INJECT/IMAGING  03/23/2017  . TRANSTHORACIC ECHOCARDIOGRAM  06/2015   In setting of TIA.  Moderate basal hypertrophy. Vigorous function with EF 65-70%. Mild aortic stenosis with mean gradient 11 mmHg. Severe MAC with mild MR. Moderate LA dilation.    Family History  Problem Relation Age of Onset  . Heart attack Cousin   . Cancer Mother 37       breast  . Pneumonia Father 9  . Transient ischemic attack Sister   . Atrial fibrillation Brother   . Hypertension Daughter   . Hypertension Son   . Hypertension Son     Social History   Socioeconomic History  .  Marital status: Widowed    Spouse name: Not on file  . Number of children: Not on file  . Years of education: Not on file  . Highest education level: Not on file  Occupational History  . Not on file  Social Needs  . Financial resource strain: Not hard at all  . Food insecurity:    Worry: Never true    Inability: Never true  . Transportation needs:    Medical: No    Non-medical: No  Tobacco Use  . Smoking status: Never Smoker  . Smokeless tobacco: Never Used  Substance and Sexual Activity    . Alcohol use: No  . Drug use: No  . Sexual activity: Not Currently  Lifestyle  . Physical activity:    Days per week: 0 days    Minutes per session: 0 min  . Stress: To some extent  Relationships  . Social connections:    Talks on phone: More than three times a week    Gets together: Never    Attends religious service: Never    Active member of club or organization: No    Attends meetings of clubs or organizations: Never    Relationship status: Married  . Intimate partner violence:    Fear of current or ex partner: No    Emotionally abused: No    Physically abused: No    Forced sexual activity: No  Other Topics Concern  . Not on file  Social History Narrative  . Not on file    Review of systems: Review of Systems  Constitutional: No fever no chills HENT: Negative Eyes: Negative for blurred vision.   Respiratory: Shortness of breath on exertion, denies a cough Cardiovascular: Negative for chest pain and palpitations.  No leg swelling  all other systems reviewed and are negative.  Physical Exam:  Vitals:   04/04/18 1127  BP: 114/62  Pulse: 71  SpO2: 100%   Gen:      Frail, comfortable HEENT: Moist oral mucosa Neck:    No thyromegaly, no adenopathy Lungs:    Good air entry bilaterally, no rales CV:         S1-S2 appreciated Abd:   Bowel sounds appreciated  Data Reviewed: .CT scan of the chest which was performed on 11/27/2017 reviewed by myself showing large pleural effusions bilaterally  .  Chest x-ray reviewed, stable  .  No recent testing  Assessment:  .  Abnormal CT scan of the chest showing a cavitary right upper lobe nodule/chest x-ray showing cavitary right upper lobe nodule -Has remained stable -No plans for invasive intervention -Clinically remained stable  .  Bilateral pleural effusions Resolved on last chest x-ray -Stable status -Has not been more short of breath than usual -will be reinitiating Lasix  .  Decompensated congestive heart  failure -Appears to be better compensated, symptom wise  .  Patient is very frail  .  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  She will be using Lasix half a pill twice a week as tolerated  .  There is a real possibility of a neoplastic process however the patient is too frail for further invasive intervention  -she continues to do well No invasive intervention desired Has remained clinically stable  .  If she gets more short of breath, she should call us and we will plan for thoracentesis as needed  I will see her back in 2 months  Sherrilyn Rist MD  Pulmonary and Critical  Care 04/04/2018, 12:30 PM  CC: [name], NP

## 2018-04-17 ENCOUNTER — Ambulatory Visit (INDEPENDENT_AMBULATORY_CARE_PROVIDER_SITE_OTHER): Payer: Medicare HMO | Admitting: Nurse Practitioner

## 2018-04-17 ENCOUNTER — Encounter: Payer: Self-pay | Admitting: Nurse Practitioner

## 2018-04-17 VITALS — BP 108/62 | Ht 62.0 in | Wt 101.4 lb

## 2018-04-17 DIAGNOSIS — M546 Pain in thoracic spine: Secondary | ICD-10-CM

## 2018-04-17 DIAGNOSIS — I480 Paroxysmal atrial fibrillation: Secondary | ICD-10-CM | POA: Diagnosis not present

## 2018-04-17 DIAGNOSIS — I10 Essential (primary) hypertension: Secondary | ICD-10-CM | POA: Diagnosis not present

## 2018-04-17 DIAGNOSIS — E039 Hypothyroidism, unspecified: Secondary | ICD-10-CM | POA: Diagnosis not present

## 2018-04-17 DIAGNOSIS — J90 Pleural effusion, not elsewhere classified: Secondary | ICD-10-CM

## 2018-04-17 DIAGNOSIS — R531 Weakness: Secondary | ICD-10-CM

## 2018-04-17 DIAGNOSIS — G8929 Other chronic pain: Secondary | ICD-10-CM

## 2018-04-17 DIAGNOSIS — R600 Localized edema: Secondary | ICD-10-CM

## 2018-04-17 NOTE — Progress Notes (Signed)
Careteam: Patient Care Team: [name], NP as PCP - General (Geriatric Medicine) [name], NP as PCP - Internal Medicine (Geriatric Medicine) Leonie Man, MD as PCP - Cardiology (Cardiology) Glennie Isle, PA-C as Consulting Physician (Physician Assistant) Leonie Man, MD as Consulting Physician (Cardiology)  Advanced Directive information Does Patient Have a Medical Advance Directive?: Yes, Type of Advance Directive: Out of facility DNR (pink MOST or yellow form), Pre-existing out of facility DNR order (yellow form or pink MOST form): Yellow form placed in chart (order not valid for inpatient use), Does patient want to make changes to medical advance directive?: No - Patient declined  Allergies  Allergen Reactions  . Synthroid [Levothyroxine] Other (See Comments)    Generic causes headache/ must be name brand Synthroid  . Tylenol [Acetaminophen] Other (See Comments)    Makes my body feel numb     Chief Complaint  Patient presents with  . Medical Management of Chronic Issues    4 month follow up,due for [id number]     HPI: Patient is a 85 y.o. female seen in the office today for routine follow up.  Pt has missed follow up. Pt with hx of afib on eliquis, prior stroke, memory loss, aortic stenosis (mild on her echo 2017, but very audible and high-pitched currently), hypothyroidism, plural effusion, CHF  Son lives with her and she is eating more. Weight has been trending up does not feel like she is retaining a lot of fluid.   Ongoing falls. Left knee is weak an will give out. Worse when she is tired. No injuries. Doing stretches to help.   pleural effusion- following with pulmonary, reports possibility of this being neoplastic but too frail for invasive intervention, she uses lasix but does not like to take because it makes her urinate too much.  Shortness of breath is not worse.   Hypothyroid- stopped synthroid after she broke out, was given  name brand only- and not having any side effects. She was recently increased to 50 mcg TSH was low 1 month ago, has follow up TSH scheduled.    Afib- following with cardiologist every 6 months. Taking metoprolol 25 mg by mouth in the morning and 1/2 tablet in the evening. eliquis for anticoagulation.   Hx of t12 compression fracture- s/p vertebroplasty -overall pain is better.   Review of Systems:  Review of Systems  Constitutional: Positive for malaise/fatigue. Negative for chills, diaphoresis, fever and weight loss.  HENT: Negative for congestion and tinnitus.   Eyes: Negative for blurred vision.  Respiratory: Positive for shortness of breath. Negative for cough and sputum production.   Cardiovascular: Positive for palpitations. Negative for chest pain, orthopnea and leg swelling.  Gastrointestinal: Negative for abdominal pain, constipation and diarrhea.  Genitourinary: Negative for dysuria.  Musculoskeletal: Positive for falls.       Last was several weeks ago  Neurological: Positive for dizziness (occasionally) and weakness. Negative for loss of consciousness and headaches.  Psychiatric/Behavioral: Positive for memory loss.    Past Medical History:  Diagnosis Date  . Anemia   . Atrial fibrillation, persistent 06/2015   Admitted with TIA. Diagnosed with A. fib. Rate control. On Eliquis.  . Compression fracture of T12 vertebra ([place])   . Hypothyroid   . Lyme disease 2017  . Stroke [facility]) 06/2015   -TIA   Past Surgical History:  Procedure Laterality Date  . ABDOMINAL HYSTERECTOMY  1968  . BLADDER SUSPENSION    . IR  THORACENTESIS ASP PLEURAL SPACE W/IMG GUIDE  10/31/2017  . IR VERTEBROPLASTY CERV/THOR BX INC UNI/BIL INC/INJECT/IMAGING  03/23/2017  . IR VERTEBROPLASTY EA ADDL (T&LS) BX INC UNI/BIL INC INJECT/IMAGING  03/23/2017  . TRANSTHORACIC ECHOCARDIOGRAM  06/2015   In setting of TIA.  Moderate basal hypertrophy. Vigorous function with EF 65-70%. Mild aortic stenosis with mean  gradient 11 mmHg. Severe MAC with mild MR. Moderate LA dilation.   Social History:   reports that she has never smoked. She has never used smokeless tobacco. She reports that she does not drink alcohol or use drugs.  Family History  Problem Relation Age of Onset  . Heart attack Cousin   . Cancer Mother 28       breast  . Pneumonia Father 30  . Transient ischemic attack Sister   . Atrial fibrillation Brother   . Hypertension Daughter   . Hypertension Son   . Hypertension Son     Medications: Patient's Medications  New Prescriptions   No medications on file  Previous Medications   ELIQUIS 2.5 MG TABS TABLET    TAKE 1 TABLET BY MOUTH TWICE A DAY   FUROSEMIDE (LASIX) 20 MG TABLET    TAKE 10 MG ( 1/2 TABLET OF 20 MG)  ONCE A WEEK   LEVOTHYROXINE (SYNTHROID, LEVOTHROID) 50 MCG TABLET    Take 50 mcg by mouth daily before breakfast. Must be NAME BRAND   LORATADINE (CLARITIN) 10 MG TABLET    Take 10 mg by mouth daily.   MAGNESIUM HYDROXIDE (MILK OF MAGNESIA) 400 MG/5ML SUSPENSION    Take 15 mLs by mouth daily as needed for mild constipation.   METOPROLOL TARTRATE (LOPRESSOR) 25 MG TABLET    TAKE 0.5 TABLETS (12.5 MG TOTAL) BY MOUTH 2 (TWO) TIMES DAILY.   NAPHAZOLINE-PHENIRAMINE (VISINE-A) 0.025-0.3 % OPHTHALMIC SOLUTION    Place 1 drop into both eyes 2 (two) times daily.   NAPROXEN SODIUM (ALEVE) 220 MG TABLET    Take 220 mg by mouth.  Modified Medications   No medications on file  Discontinued Medications   No medications on file     Physical Exam:  Vitals:   04/17/18 1317  BP: 108/62  Weight: 101 lb 6.4 oz (46 kg)  Height: 5' 2"  (1.575 m)   Body mass index is 18.55 kg/m.  Physical Exam Constitutional:      General: She is not in acute distress.    Comments: Frail appearing white female   HENT:     Head: Normocephalic and atraumatic.  Eyes:     Pupils: Pupils are equal, round, and reactive to light.     Comments: Left lower lid ptosis; glasses from many years ago    Neck:     Musculoskeletal: Neck supple.  Cardiovascular:     Rate and Rhythm: Normal rate. Rhythm irregularly irregular.     Heart sounds: Murmur present. Systolic murmur present with a grade of 2/6.  Pulmonary:     Effort: Pulmonary effort is normal.     Breath sounds: Examination of the right-lower field reveals rales. Examination of the left-lower field reveals rales. Rales present.  Musculoskeletal: Normal range of motion.  Skin:    General: Skin is warm and dry.     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     Labs reviewed: Basic Metabolic Panel: Recent Labs    06/06/17 1057  07/25/17 1053  10/16/17 1235 11/24/17 1244 11/27/17 1111 01/10/18 1246  NA  --   --    < > 133* 132* 135 132*  K  --   --    < > 4.9 4.8 3.9 4.5  CL  --   --    < > 98 96 96* 97  CO2  --   --    < > 23 31 30 29   GLUCOSE  --   --    < > 97 102* 115* 100*  BUN  --   --    < > 22 27* 21 28*  CREATININE  --   --    < > 0.87 0.86 0.97 0.95  CALCIUM  --   --    < > 9.3 9.8 9.1 9.5  TSH 9.31* 8.58*  --  5.22*  --   --   --    < > = values in this interval not displayed.   Liver Function Tests: Recent Labs    09/19/17 1355 10/16/17 1235 11/24/17 1244  AST 28 27 30   ALT 23 14 26   ALKPHOS  --   --  61  BILITOT 0.8 0.9 0.8  PROT 7.2 7.5 8.3  ALBUMIN  --   --  4.4   No results for input(s): LIPASE, AMYLASE in the last 8760 hours. No results for input(s): AMMONIA in the last 8760 hours. CBC: Recent Labs    09/19/17 1355 10/16/17 1235 11/24/17 1244 11/27/17 1111  WBC 7.9 7.3 6.7 6.3  NEUTROABS 4,945 4,935  --   --   HGB 12.3 12.4 12.9 11.8*  HCT 36.7 36.5 38.4 37.9  MCV 93.1 92.4 93.3 97.4  PLT 226 236 200.0 182   Lipid Panel: No results for input(s): CHOL, HDL, LDLCALC, TRIG, CHOLHDL, LDLDIRECT in the last 8760 hours. TSH: Recent Labs    06/06/17 1057 07/25/17 1053  10/16/17 1235  TSH 9.31* 8.58* 5.22*   A1C: Lab Results  Component Value Date   HGBA1C 6.3 (H) 07/14/2015     Assessment/Plan 1. Acquired hypothyroidism Tolerating synthroid 50 mcg at this time. Will follow up today.  - TSH  2. Paroxysmal atrial fibrillation (HCC) Rate controlled on lopressor. Continues on eliquis for anticoagulation.  - CBC with Differential/Platelets  3. Essential hypertension Blood pressure stable  - CMP  4. Chronic midline thoracic back pain Has been stable. Improved at this time. Will use occasional aleve for severe pain. Allergy to tylenol.   5. Weakness Ongoing weakness with falls. Does not wish to have PT at this time.   6. Plural effusion Following with cardiology and pulmonary. No worsening of shortness of breath or edema. Uses lasix 1/2 tablet 1-2 times weekly  7. Edema -remains stable at this time.   Declines pneumonia vaccines. Next appt: 4 months.  Carlos American. [place], [facility] [phone]

## 2018-04-18 ENCOUNTER — Other Ambulatory Visit: Payer: Self-pay

## 2018-04-18 ENCOUNTER — Other Ambulatory Visit: Payer: Self-pay | Admitting: Cardiology

## 2018-04-18 DIAGNOSIS — I10 Essential (primary) hypertension: Secondary | ICD-10-CM

## 2018-04-18 DIAGNOSIS — D649 Anemia, unspecified: Secondary | ICD-10-CM

## 2018-04-18 DIAGNOSIS — I4891 Unspecified atrial fibrillation: Secondary | ICD-10-CM

## 2018-04-18 LAB — COMPREHENSIVE METABOLIC PANEL
AG Ratio: 1.1 (calc) (ref 1.0–2.5)
ALBUMIN MSPROF: 3.9 g/dL (ref 3.6–5.1)
ALKALINE PHOSPHATASE (APISO): 56 U/L (ref 33–130)
ALT: 20 U/L (ref 6–29)
AST: 25 U/L (ref 10–35)
BUN/Creatinine Ratio: 42 (calc) — ABNORMAL HIGH (ref 6–22)
BUN: 42 mg/dL — ABNORMAL HIGH (ref 7–25)
CALCIUM: 9.2 mg/dL (ref 8.6–10.4)
CO2: 28 mmol/L (ref 20–32)
Chloride: 102 mmol/L (ref 98–110)
Creat: 1.01 mg/dL — ABNORMAL HIGH (ref 0.60–0.88)
GLOBULIN: 3.5 g/dL (ref 1.9–3.7)
Glucose, Bld: 124 mg/dL (ref 65–139)
Potassium: 4.4 mmol/L (ref 3.5–5.3)
Sodium: 137 mmol/L (ref 135–146)
TOTAL PROTEIN: 7.4 g/dL (ref 6.1–8.1)
Total Bilirubin: 0.7 mg/dL (ref 0.2–1.2)

## 2018-04-18 LAB — CBC WITH DIFFERENTIAL/PLATELET
Absolute Monocytes: 403 cells/uL (ref 200–950)
Basophils Absolute: 40 cells/uL (ref 0–200)
Basophils Relative: 0.6 %
EOS PCT: 1.4 %
Eosinophils Absolute: 92 cells/uL (ref 15–500)
HEMATOCRIT: 33 % — AB (ref 35.0–45.0)
Hemoglobin: 10.8 g/dL — ABNORMAL LOW (ref 11.7–15.5)
LYMPHS ABS: 1921 {cells}/uL (ref 850–3900)
MCH: 30.9 pg (ref 27.0–33.0)
MCHC: 32.7 g/dL (ref 32.0–36.0)
MCV: 94.6 fL (ref 80.0–100.0)
MPV: 10 fL (ref 7.5–12.5)
Monocytes Relative: 6.1 %
NEUTROS PCT: 62.8 %
Neutro Abs: 4145 cells/uL (ref 1500–7800)
PLATELETS: 195 10*3/uL (ref 140–400)
RBC: 3.49 10*6/uL — AB (ref 3.80–5.10)
RDW: 14.1 % (ref 11.0–15.0)
Total Lymphocyte: 29.1 %
WBC: 6.6 10*3/uL (ref 3.8–10.8)

## 2018-04-18 LAB — TSH: TSH: 4.71 mIU/L — ABNORMAL HIGH (ref 0.40–4.50)

## 2018-04-23 IMAGING — DX DG RIBS W/ CHEST 3+V*R*
3 series · 3 of 3 positions shown · non-contrast
Comparison: Chest x-ray 07/14/2015

CLINICAL DATA: Fell 1 week ago.  Persistent right-sided chest pain.

EXAM:
RIGHT RIBS AND CHEST - 3+ VIEW

[chest pa]
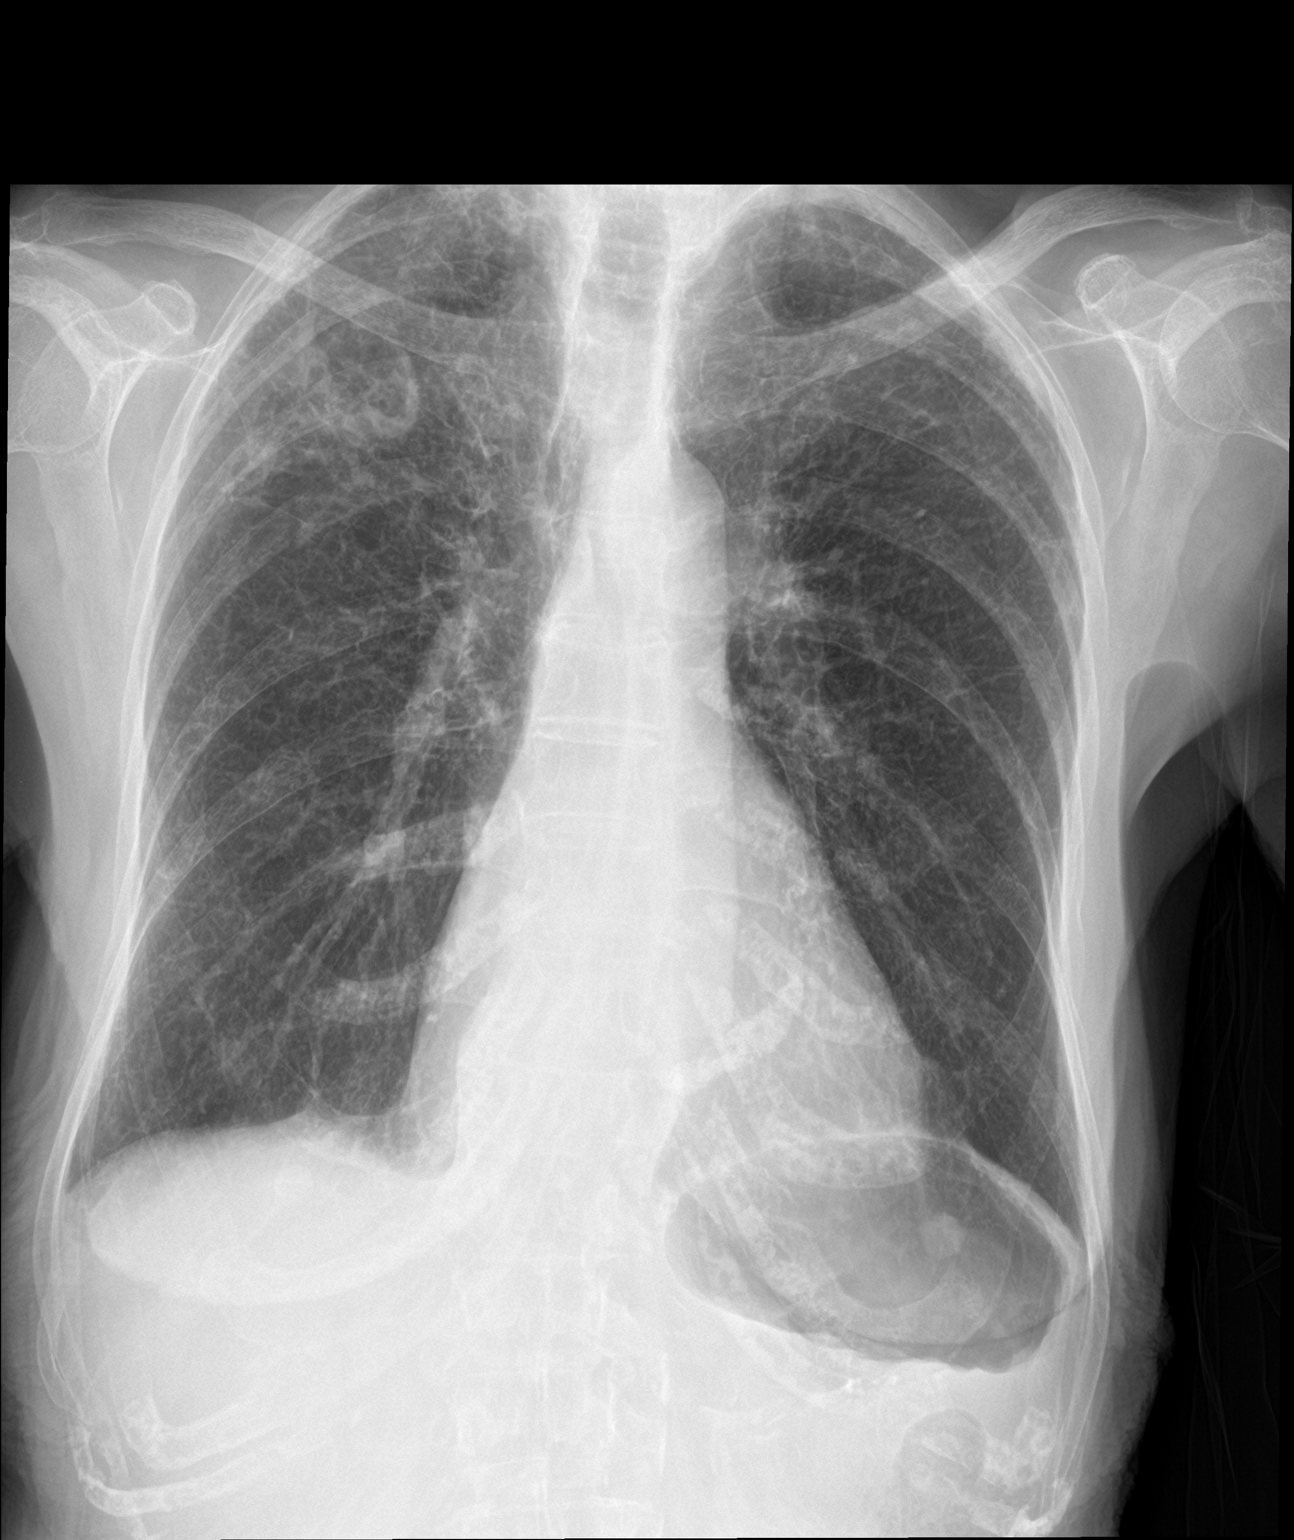

[rib pa]
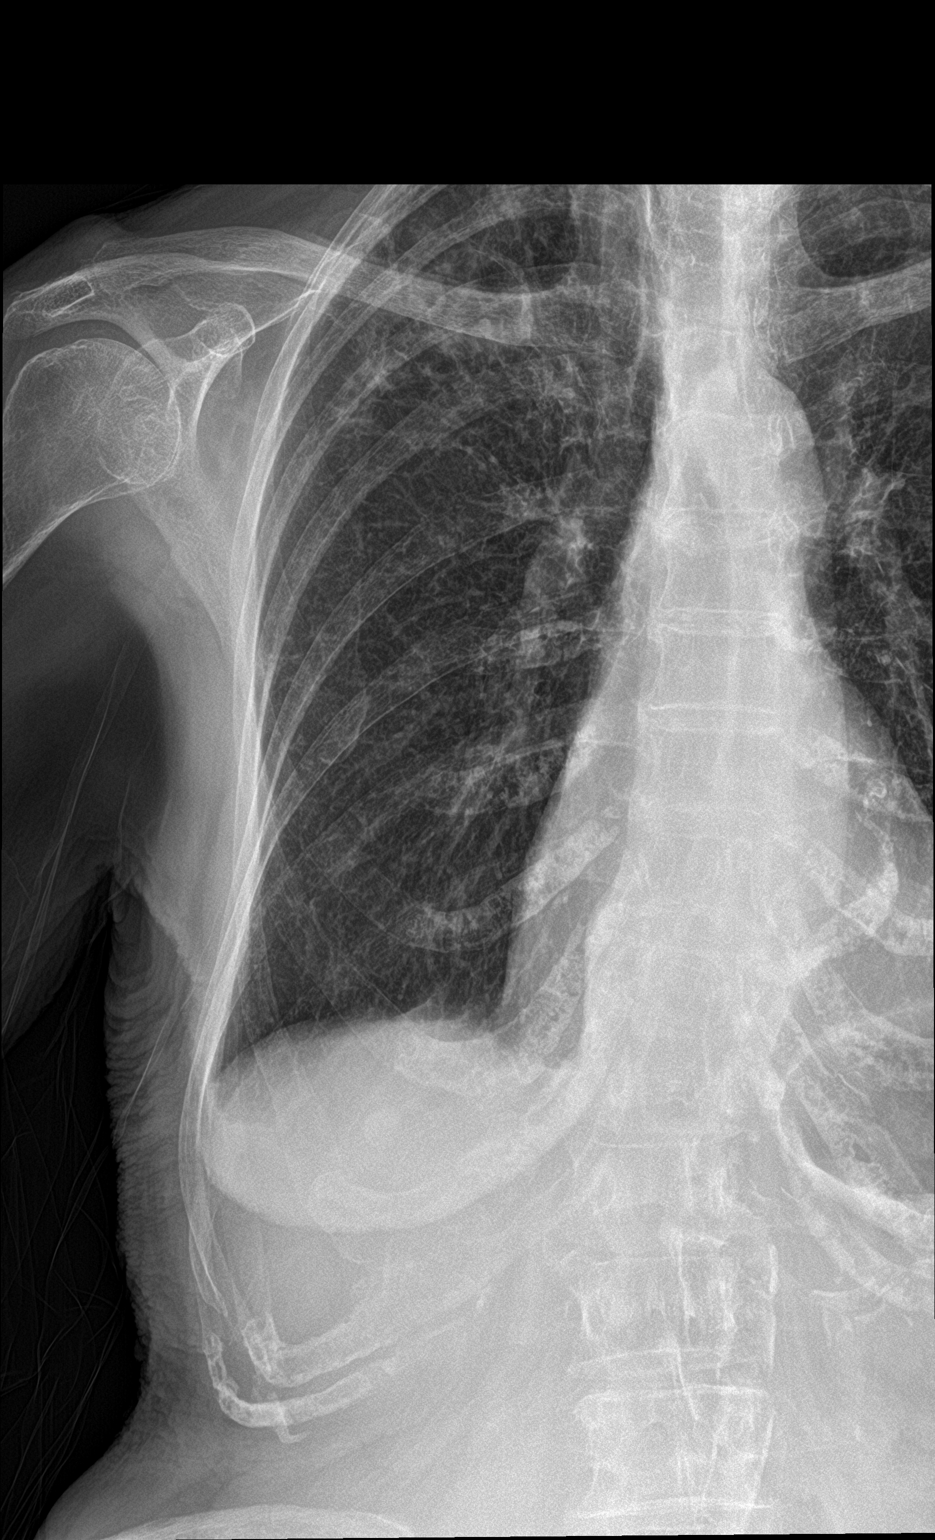

[rib obl]
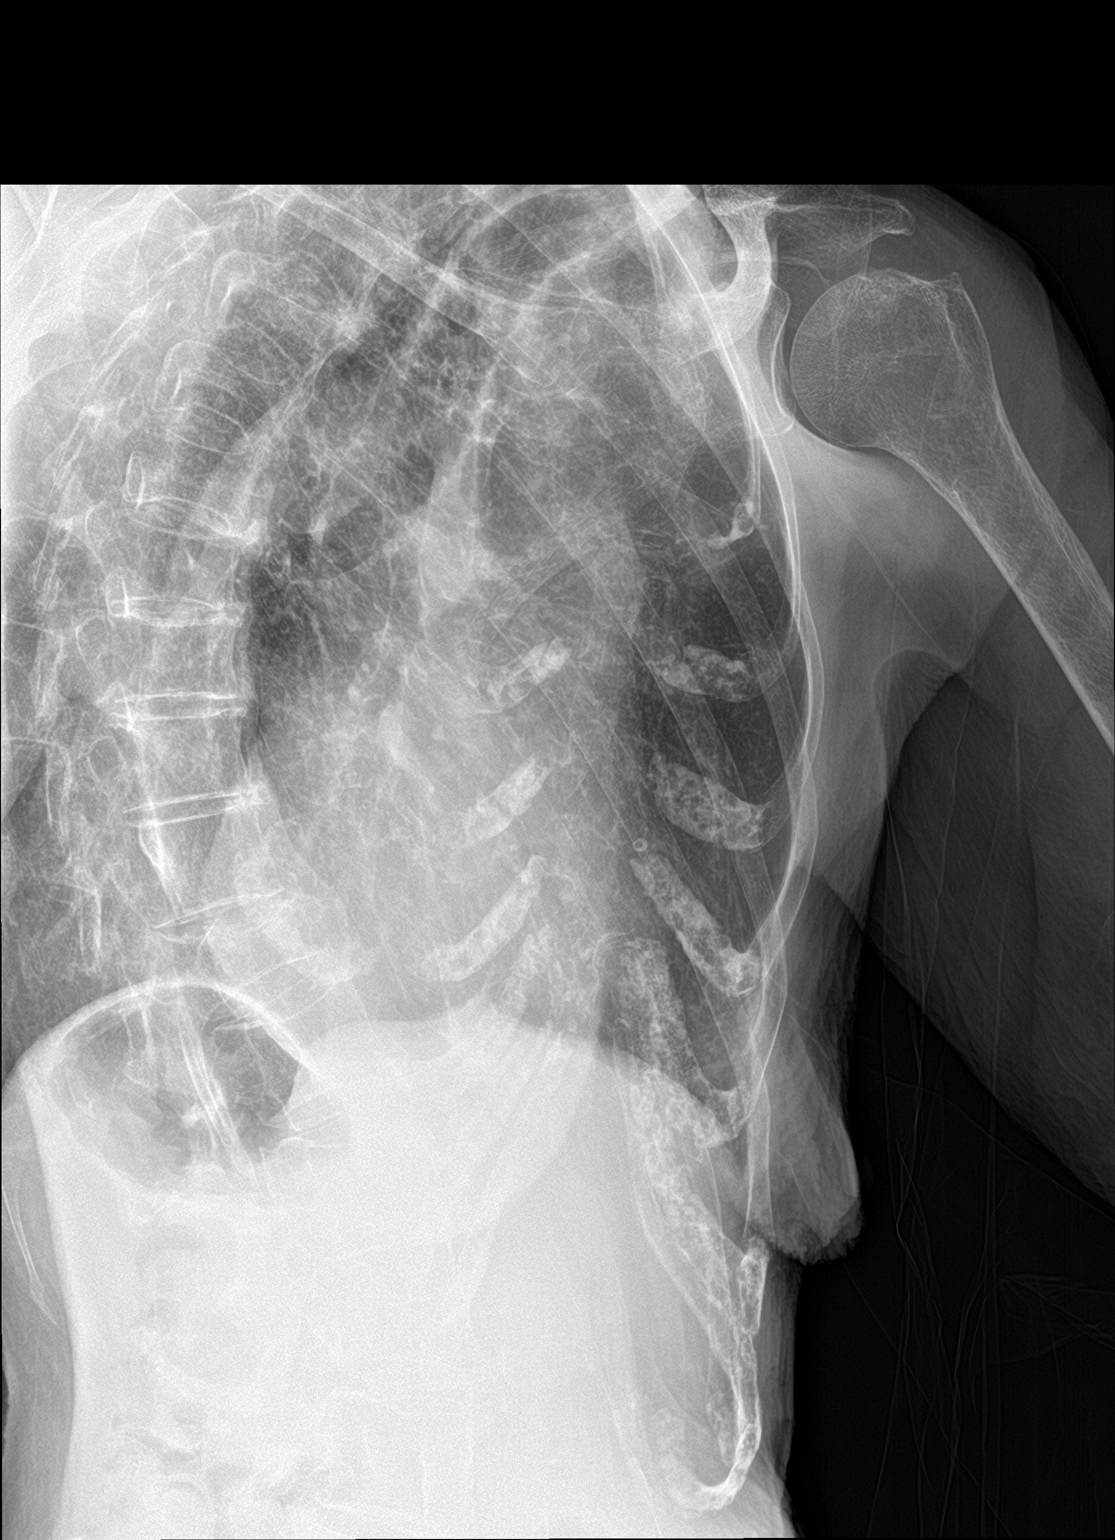

[3 of 3 positions shown; findings below may reference images not displayed]

FINDINGS: The cardiac silhouette, mediastinal and hilar contours are within
normal limits and stable. Severe chronic lung disease with emphysema
and pulmonary scarring. Stable irregular right upper lobe density.
No pleural effusion.

Dedicated right rib films do not demonstrate any definite acute rib
fractures.
IMPRESSION: Chronic lung changes but no definite acute overlying pulmonary
process.

No definite acute right-sided rib fractures.

## 2018-04-26 IMAGING — DX DG SHOULDER 2+V*R*
2 series · 2 of 2 positions shown · non-contrast
Comparison: None.

CLINICAL DATA: Fell twice in last week, on Eliquis, right shoulder
pain

EXAM:
RIGHT SHOULDER - 2+ VIEW

[t shoulder y-view right]
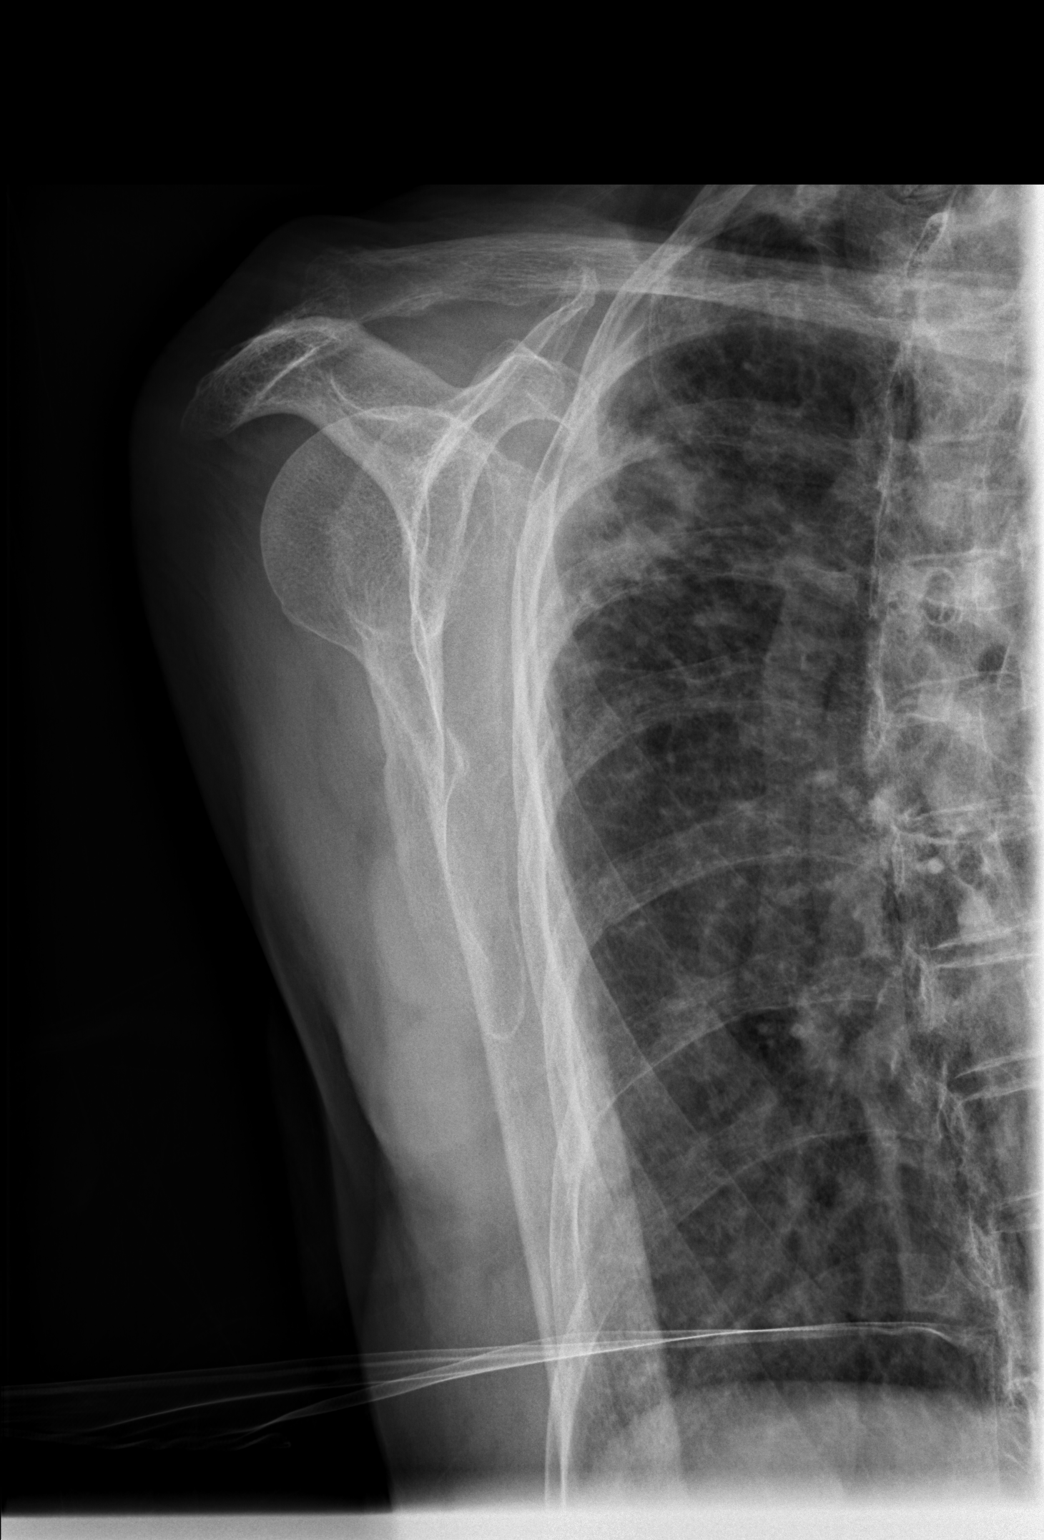

[t shoulder external right]
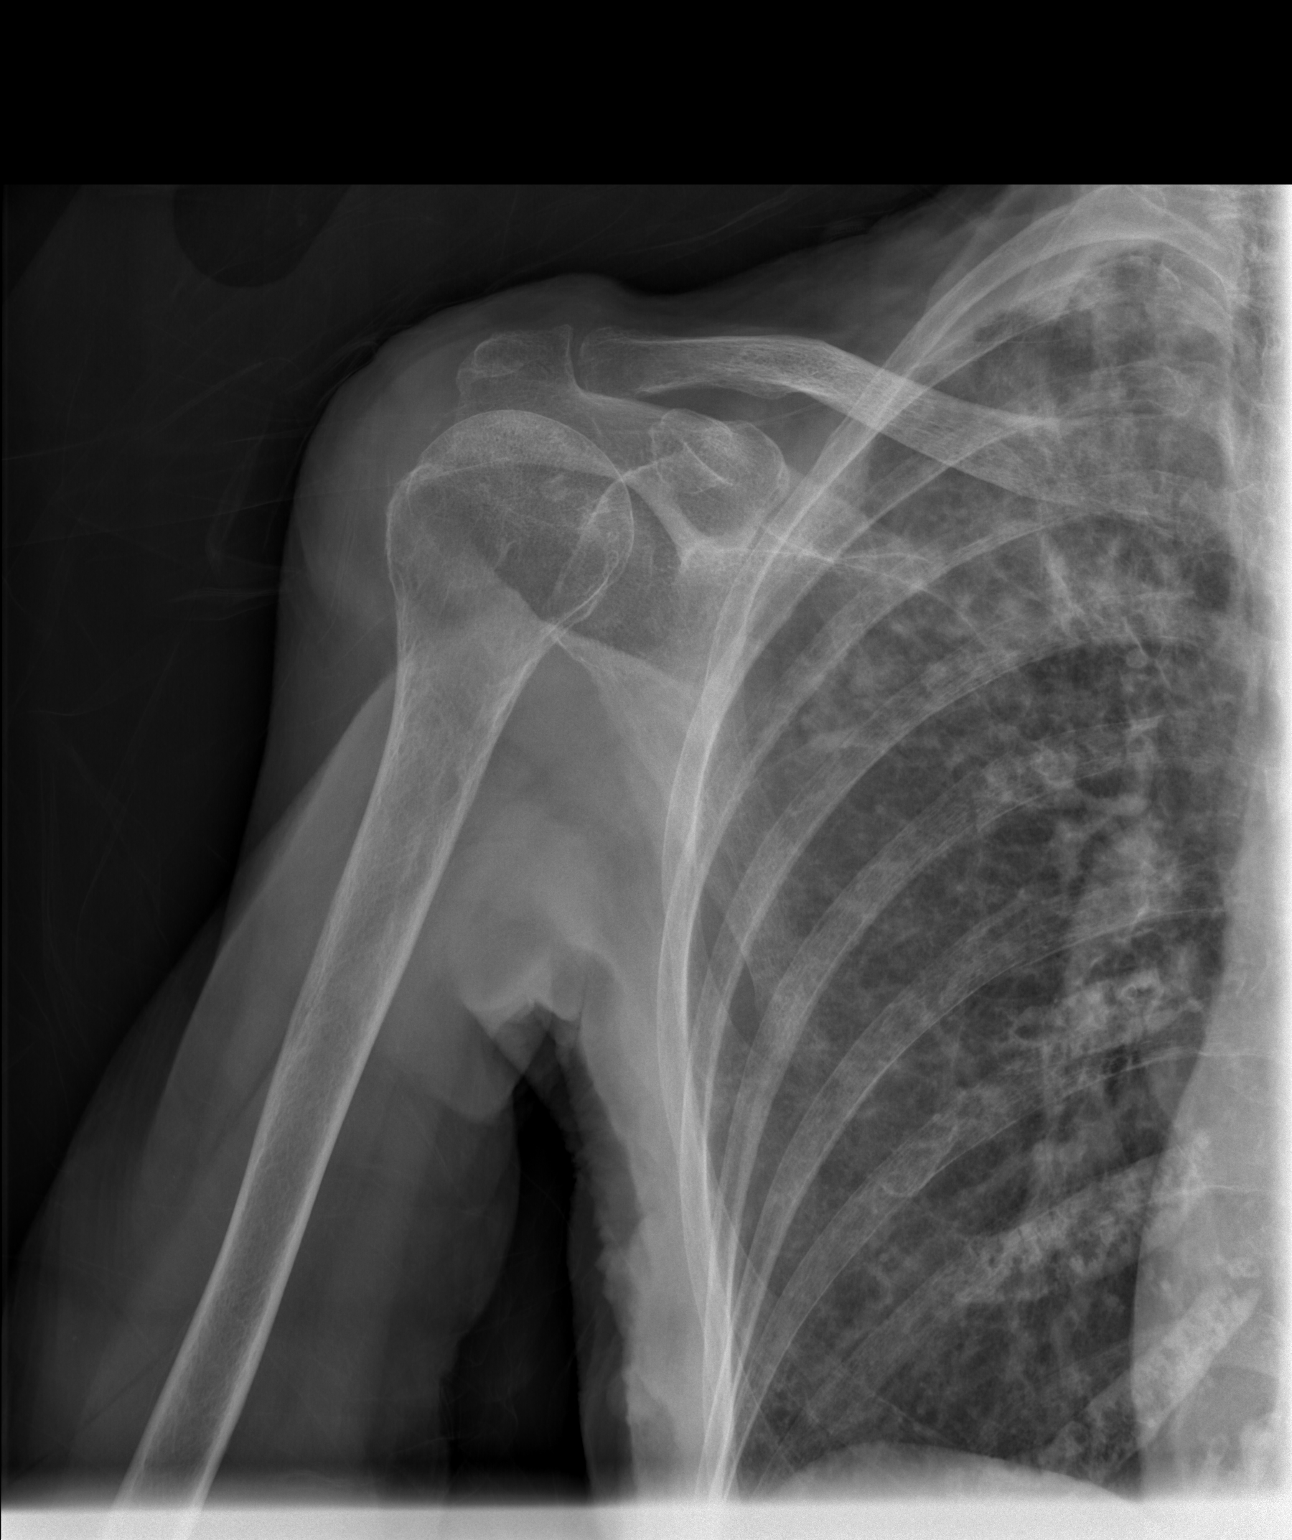

[2 of 2 positions shown; findings below may reference images not displayed]

FINDINGS: Two views of the right shoulder submitted. No acute fracture or
subluxation. There is diffuse osteopenia. Mild to moderate
degenerative changes AC joint. Minimal spurring of humeral head.
IMPRESSION: No acute fracture or subluxation. Degenerative changes as described
above. Diffuse osteopenia.

## 2018-04-26 IMAGING — DX DG CHEST 2V
2 series · 2 of 2 positions shown · non-contrast
Comparison: 05/17/2016

CLINICAL DATA: Fall twice within the past 2 weeks.

EXAM:
CHEST  2 VIEW

[x chest ap]
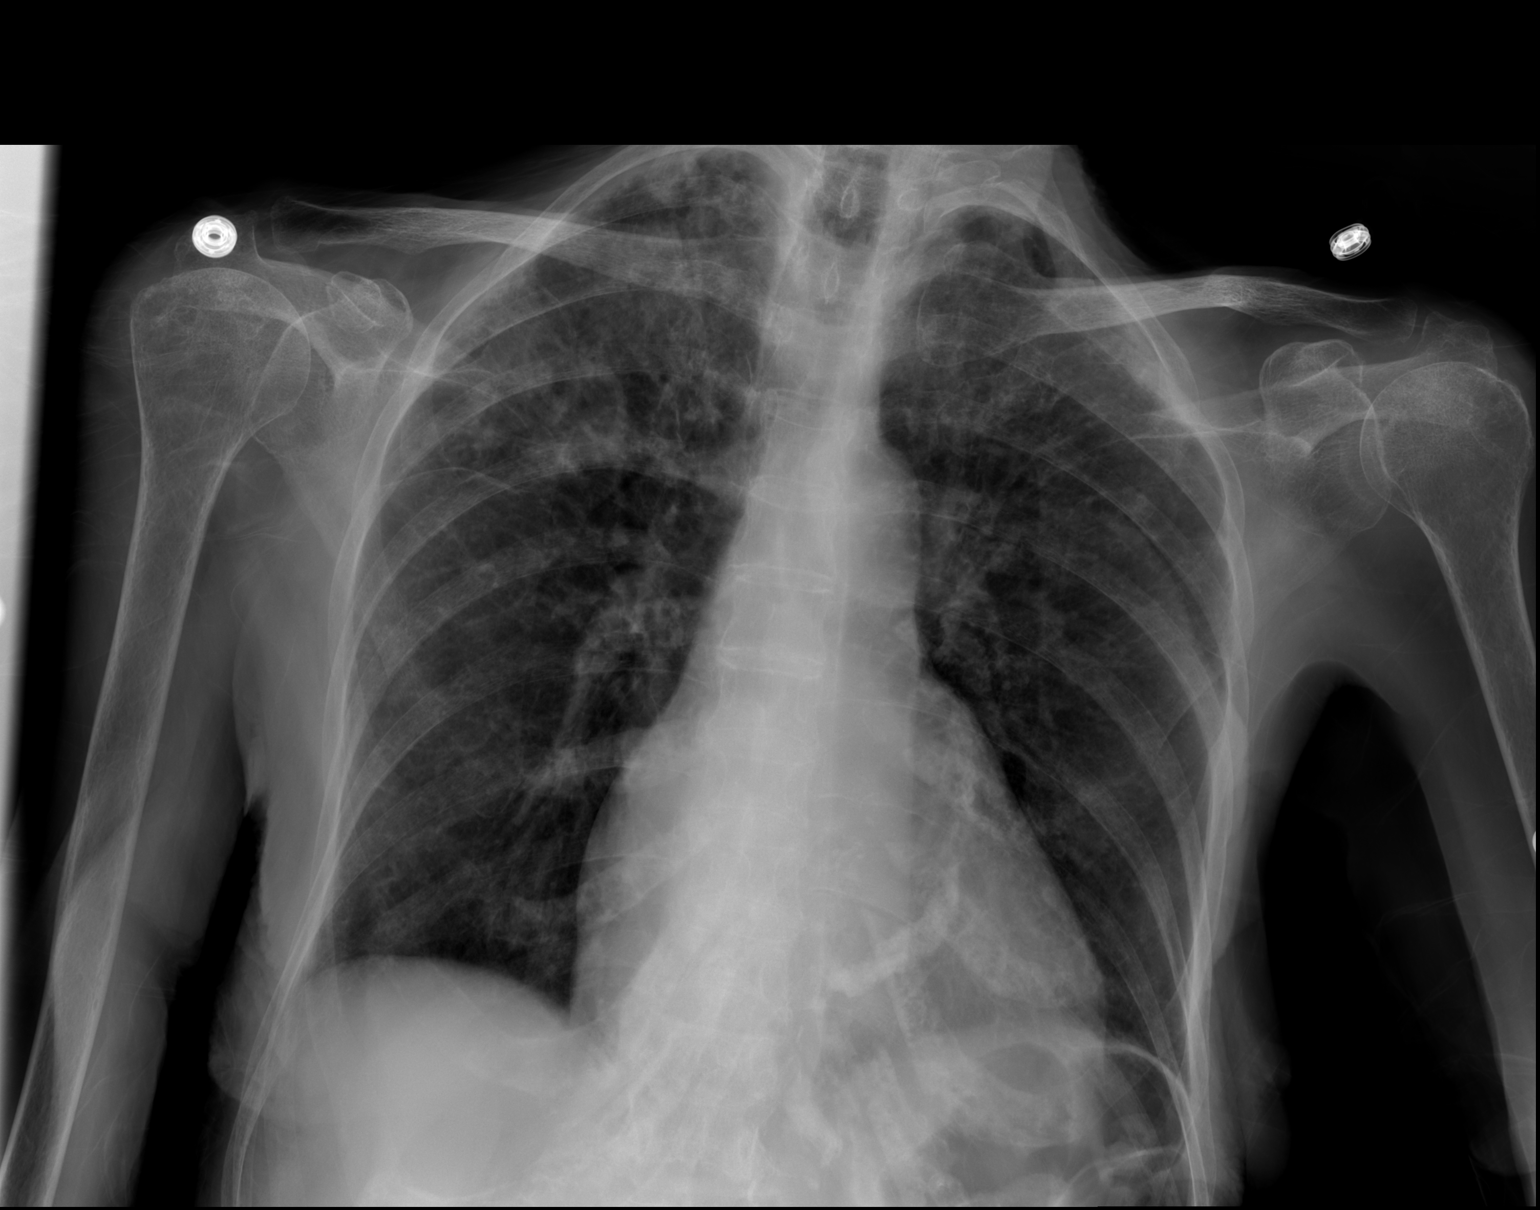

[w chest lat]
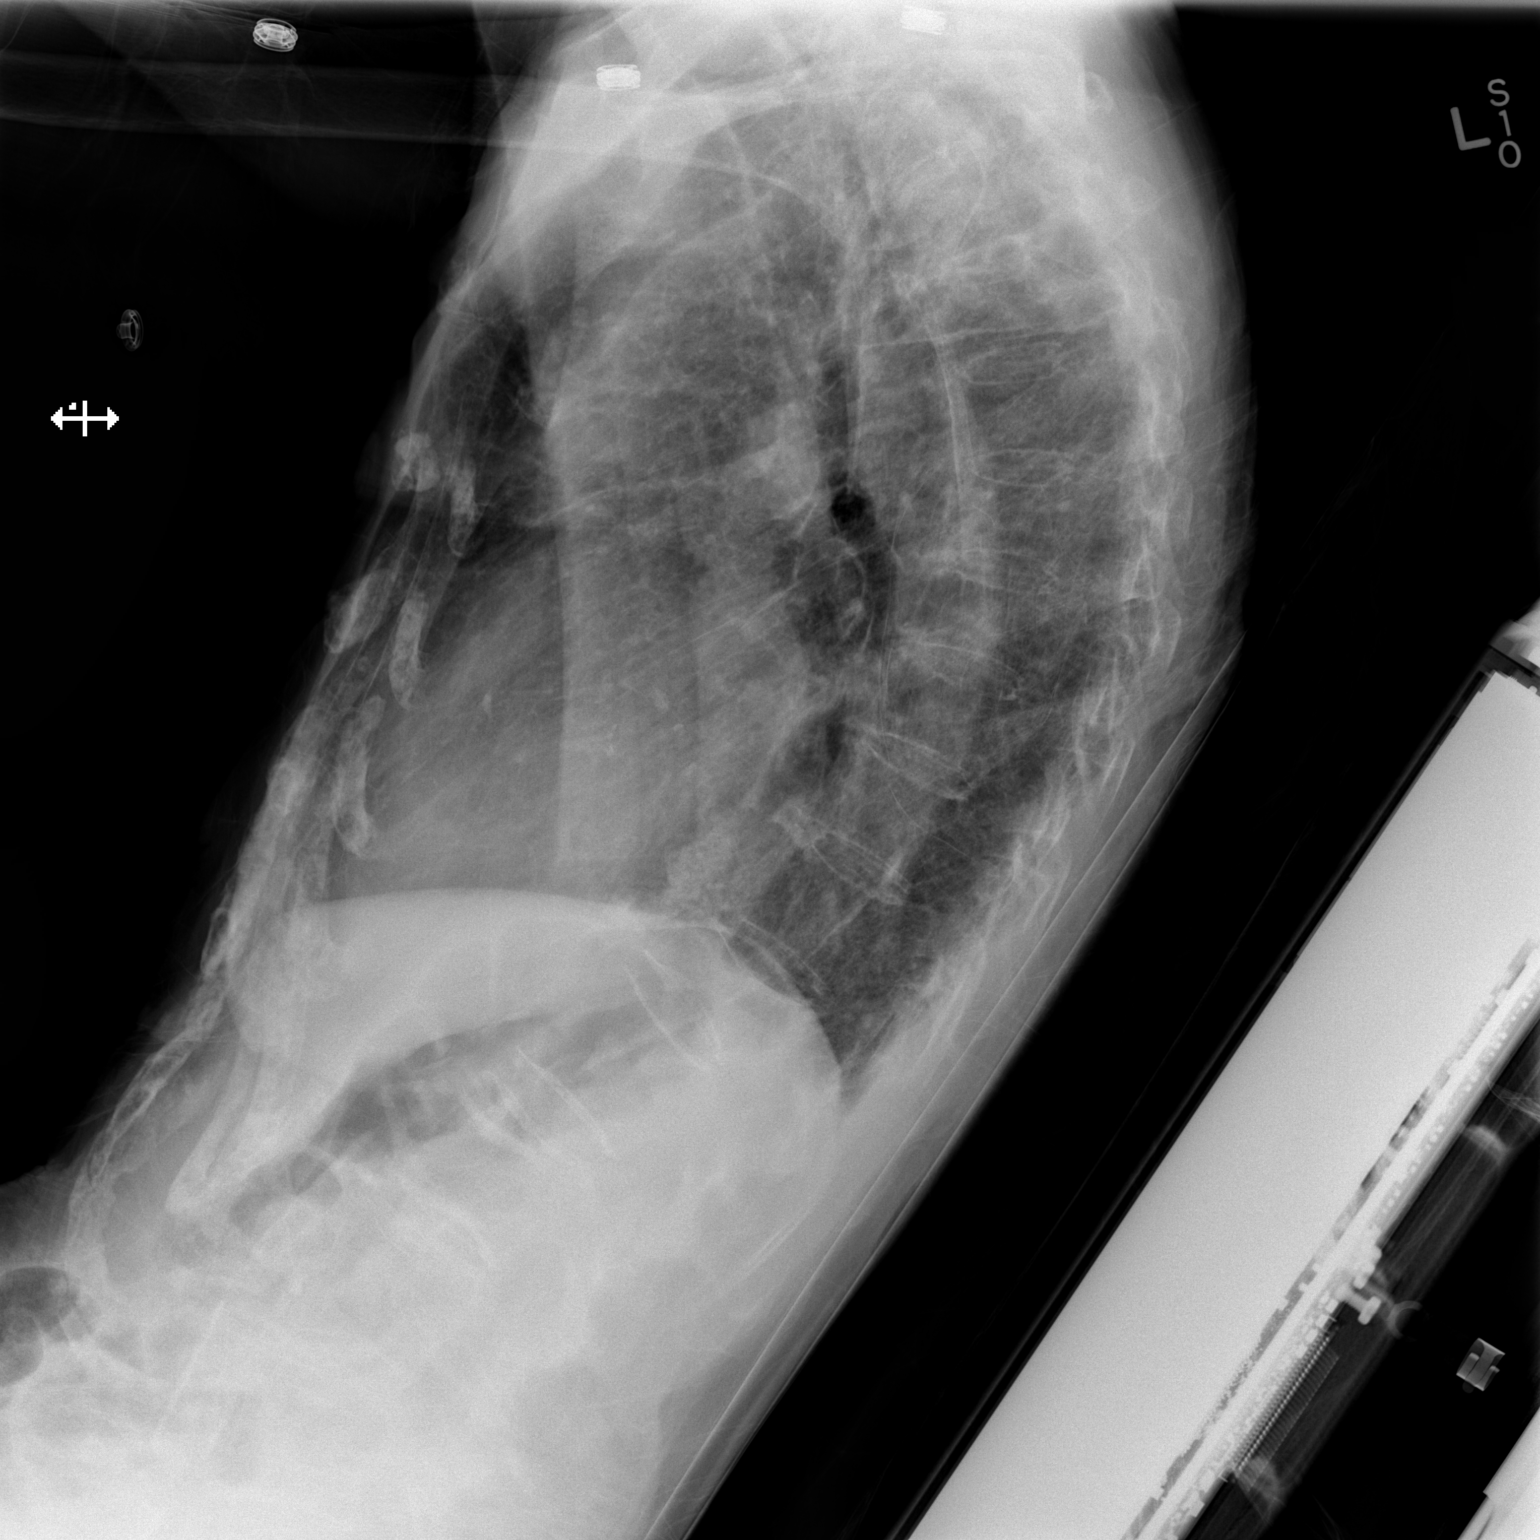

[2 of 2 positions shown; findings below may reference images not displayed]

FINDINGS: Chronic changes within the upper lobes with scarring. Underline
COPD. Mild cardiomegaly. No effusions or acute bony abnormality.
IMPRESSION: COPD, advance chronic lung disease.  No active disease.

## 2018-05-21 ENCOUNTER — Other Ambulatory Visit: Payer: Medicare HMO

## 2018-05-22 ENCOUNTER — Other Ambulatory Visit: Payer: Self-pay | Admitting: Nurse Practitioner

## 2018-05-29 ENCOUNTER — Other Ambulatory Visit: Payer: Medicare HMO

## 2018-06-13 ENCOUNTER — Ambulatory Visit: Payer: Medicare HMO | Admitting: Pulmonary Disease

## 2018-06-13 ENCOUNTER — Other Ambulatory Visit: Payer: Medicare HMO

## 2018-06-27 ENCOUNTER — Other Ambulatory Visit: Payer: Self-pay

## 2018-06-27 ENCOUNTER — Ambulatory Visit (INDEPENDENT_AMBULATORY_CARE_PROVIDER_SITE_OTHER): Payer: Medicare HMO | Admitting: Nurse Practitioner

## 2018-06-27 ENCOUNTER — Encounter: Payer: Self-pay | Admitting: Nurse Practitioner

## 2018-06-27 ENCOUNTER — Telehealth: Payer: Self-pay | Admitting: *Deleted

## 2018-06-27 DIAGNOSIS — M545 Low back pain: Secondary | ICD-10-CM | POA: Diagnosis not present

## 2018-06-27 DIAGNOSIS — R600 Localized edema: Secondary | ICD-10-CM | POA: Diagnosis not present

## 2018-06-27 DIAGNOSIS — J90 Pleural effusion, not elsewhere classified: Secondary | ICD-10-CM | POA: Diagnosis not present

## 2018-06-27 DIAGNOSIS — G8929 Other chronic pain: Secondary | ICD-10-CM

## 2018-06-27 NOTE — Progress Notes (Signed)
This service is provided via telemedicine  No vital signs collected/recorded due to the encounter was a telemedicine visit.   Location of patient (ex: home, work):  Home  Patient consents to a telephone visit:  Yes  Location of the provider (ex: office, home):  [employer], office  Names of all persons participating in the telemedicine service and their role in the encounter:  Caitlyn Henry CMA, Daughter Caitlyn Henry, patient Caitlyn Henry and Caitlyn Mustache NP  Time spent on call:  5 min  Virtual Visit via Telephone Note  I connected with Caitlyn Henry on 06/27/18 at  1:00 PM EDT by telephone and verified that I am speaking with the correct person using two identifiers.   I discussed the limitations, risks, security and privacy concerns of performing an evaluation and management service by telephone and the availability of in person appointments. I also discussed with the patient that there may be a patient responsible charge related to this service. The patient expressed understanding and agreed to proceed.     Careteam: Patient Care Team: Caitlyn Chandler, NP as PCP - General (Geriatric Medicine) Caitlyn Chandler, NP as PCP - Internal Medicine (Geriatric Medicine) Caitlyn Man, MD as PCP - Cardiology (Cardiology) Caitlyn Isle, PA-C as Consulting Physician (Physician Assistant) Caitlyn Man, MD as Consulting Physician (Cardiology)  Advanced Directive information    Allergies  Allergen Reactions  . Synthroid [Levothyroxine] Other (See Comments)    Generic causes headache/ must be name brand Synthroid  . Tylenol [Acetaminophen] Other (See Comments)    Makes my body feel numb     Chief Complaint  Patient presents with  . Acute Visit    To discuss home health and/or hospice. No refills needed.      HPI: Patient is a 85 y.o. female via tele-visit to discuss options for help at home.  Daughter on the phone and reports Caitlyn Henry shortness  of breath has progressively gotten worse. She was scheduled to see Caitlyn Henry but he was pulled to work in hospital and her appt was rescheduled (to be scheduled after COVID-19) Daughter states that at this time they are planning on doing a thoracentesis (she had one a year ago and suspects she needs another)  Has swelling on her feet and has a blister on left that popped up 2 days ago. No signs of infection noted.  Does not like to take lasix, has gone up to taking 1/2 tablet three times a week which started this week.  Can not take more than 1/2 tablet daily due to feeling washed out.  Unable to get a blood pressure on home cuff.  Sits in chair with legs dangling a lot.  Eats small amounts here and there- took ensure yesterday.   Daughter is concern and feels like she needs someone to check on her. Her son lives with her and daughter reports he is having a lot of anxiety.  They had to let her caregiver go for a little while because her boyfriend has possible exposure to COVID-19 Caitlyn Henry had recommended hospice.  Does not want to to have Hospice because she does not want a hospital bed.   110/80 61 HR  Review of Systems:  Review of Systems  Constitutional: Positive for malaise/fatigue. Negative for chills and fever.  Respiratory: Positive for cough (relates to alleries) and shortness of breath.   Cardiovascular: Positive for leg swelling. Negative for chest pain.  Endo/Heme/Allergies: Positive for environmental allergies.  Past Medical History:  Diagnosis Date  . Anemia   . Atrial fibrillation, persistent 06/2015   Admitted with TIA. Diagnosed with A. fib. Rate control. On Eliquis.  . Compression fracture of T12 vertebra ([place])   . Hypothyroid   . Lyme disease 2017  . Stroke [facility]) 06/2015   -TIA   Past Surgical History:  Procedure Laterality Date  . ABDOMINAL HYSTERECTOMY  1968  . BLADDER SUSPENSION    . IR THORACENTESIS ASP PLEURAL SPACE W/IMG GUIDE  10/31/2017  . IR  VERTEBROPLASTY CERV/THOR BX INC UNI/BIL INC/INJECT/IMAGING  03/23/2017  . IR VERTEBROPLASTY EA ADDL (T&LS) BX INC UNI/BIL INC INJECT/IMAGING  03/23/2017  . TRANSTHORACIC ECHOCARDIOGRAM  06/2015   In setting of TIA.  Moderate basal hypertrophy. Vigorous function with EF 65-70%. Mild aortic stenosis with mean gradient 11 mmHg. Severe MAC with mild MR. Moderate LA dilation.   Social History:   reports that she has never smoked. She has never used smokeless tobacco. She reports that she does not drink alcohol or use drugs.  Family History  Problem Relation Age of Onset  . Heart attack Cousin   . Cancer Mother 26       breast  . Pneumonia Father 9  . Transient ischemic attack Sister   . Atrial fibrillation Brother   . Hypertension Daughter   . Hypertension Son   . Hypertension Son     Medications: Patient's Medications  New Prescriptions   No medications on file  Previous Medications   ELIQUIS 2.5 MG TABS TABLET    TAKE 1 TABLET BY MOUTH TWICE A DAY   FUROSEMIDE (LASIX) 20 MG TABLET    TAKE 10 MG ( 1/2 TABLET OF 20 MG)  ONCE A WEEK   LORATADINE (CLARITIN) 10 MG TABLET    Take 10 mg by mouth daily.   MAGNESIUM HYDROXIDE (MILK OF MAGNESIA) 400 MG/5ML SUSPENSION    Take 15 mLs by mouth daily as needed for mild constipation.   METOPROLOL TARTRATE (LOPRESSOR) 25 MG TABLET    TAKE 0.5 TABLETS (12.5 MG TOTAL) BY MOUTH 2 (TWO) TIMES DAILY.   NAPHAZOLINE-PHENIRAMINE (VISINE-A) 0.025-0.3 % OPHTHALMIC SOLUTION    Place 1 drop into both eyes 2 (two) times daily.   NAPROXEN SODIUM (ALEVE) 220 MG TABLET    Take 220 mg by mouth daily as needed.    SYNTHROID 50 MCG TABLET    TAKE 1 TABLET BY MOUTH DAILY BEFORE BREAKFAST  Modified Medications   No medications on file  Discontinued Medications   No medications on file     Physical Exam: unable due to tele-visit.     Labs reviewed: Basic Metabolic Panel: Recent Labs    07/25/17 1053  10/16/17 1235  11/27/17 1111 01/10/18 1246 04/17/18 1348   NA  --    < > 133*   < > 135 132* 137  K  --    < > 4.9   < > 3.9 4.5 4.4  CL  --    < > 98   < > 96* 97 102  CO2  --    < > 23   < > _0 GLUCOSE  --    < > 97   < > 115* 100* 124  BUN  --    < > 22   < > 21 28* 42*  CREATININE  --    < > 0.87   < > 0.97 0.95 1.01*  CALCIUM  --    < >  9.3   < > 9.1 9.5 9.2  TSH 8.58*  --  5.22*  --   --   --  4.71*   < > = values in this interval not displayed.   Liver Function Tests: Recent Labs    10/16/17 1235 11/24/17 1244 04/17/18 1348  AST _0 ALT _1 ALKPHOS  --  61  --   BILITOT 0.9 0.8 0.7  PROT 7.5 8.3 7.4  ALBUMIN  --  4.4  --    No results for input(s): LIPASE, AMYLASE in the last 8760 hours. No results for input(s): AMMONIA in the last 8760 hours. CBC: Recent Labs    09/19/17 1355 10/16/17 1235 11/24/17 1244 11/27/17 1111 04/17/18 1348  WBC 7.9 7.3 6.7 6.3 6.6  NEUTROABS 4,945 4,935  --   --  4,145  HGB 12.3 12.4 12.9 11.8* 10.8*  HCT 36.7 36.5 38.4 37.9 33.0*  MCV 93.1 92.4 93.3 97.4 94.6  PLT 226 236 200.0 182 195   Lipid Panel: No results for input(s): CHOL, HDL, LDLCALC, TRIG, CHOLHDL, LDLDIRECT in the last 8760 hours. TSH: Recent Labs    07/25/17 1053 10/16/17 1235 04/17/18 1348  TSH 8.58* 5.22* 4.71*   A1C: Lab Results  Component Value Date   HGBA1C 6.3 (H) 07/14/2015     Assessment/Plan 1. Localized edema -worsening edema, drinking Pedialyte for electrolytes and sits with legs dangled most the day. She also has poor nutritional status which can worsen LE edema.  Encouraged to stop Pedialyte due to high sodium content. To continue protein supplement daily.  Compression hose during the day with leg elevated above level of the heart.  Will increase lasix 1/2 tablet 10 mg to daily to help with symptoms of swelling and shortness of breath.  - Ambulatory referral to [place] [facility]; Future  2. Pleural effusion Per pulmonary it is noted that there is a real  possibility of a neoplastic process however the patient is too frail for further invasive intervention.  She has had progressively worsening shortness of breath. Possibly needing another thoracentesis however she does not desire to go back to the hospital at this time. Pulmonary had recommended hospice referral but at the time she had been against. Now with worsening shortness of breath and needing more care at home agreeable.  -will increase lasix to 10 mg daily to help with symptoms -follow up bmp in 1 week.  - Ambulatory referral to [place] [facility]; Future  3. Chronic bilateral low back pain without sciatica -ongoing pain but manageable at this time. No recent falls. - Ambulatory referral to [place]. Harle Battiest  [facility] [phone]   Follow Up Instructions:    I discussed the assessment and treatment plan with the patient. The patient was provided an opportunity to ask questions and all were answered. The patient agreed with the plan and demonstrated an understanding of the instructions.   The patient was advised to call back or seek an in-person evaluation if the symptoms worsen or if the condition fails to improve as anticipated.  I provided 24 minutes of non-face-to-face time during this encounter.   Caitlyn Mustache, NP

## 2018-06-27 NOTE — Telephone Encounter (Signed)
Patient daughter, Caitlyn Henry called and stated that they would like for Home Health to come out. Patient does not want Hospice but family feels like they need help. Stated that they have discussed with with you before. Would like an order for Home Health, please Advise.

## 2018-06-27 NOTE — Telephone Encounter (Signed)
Please see if we can do tele-visit, we will need more information to get proper persons to come out.

## 2018-06-27 NOTE — Telephone Encounter (Signed)
TeleVisit Scheduled.  

## 2018-06-27 NOTE — Patient Instructions (Addendum)
Increase lasix 1/2 tablet to daily-- follow up lab in 1 week.  To use compression hose daily and elevated legs above level of heart  Hospice referral placed

## 2018-07-03 ENCOUNTER — Telehealth: Payer: Self-pay | Admitting: *Deleted

## 2018-07-03 NOTE — Telephone Encounter (Signed)
Patient daughter called and wanted to know about the labwork that is suppose to be drawn. Stated that she cannot get patient into the office due to feet swelling. She stated that the Hospice nurse could possibly do it. Please Advise.  Nolon Nations, Hospice Nurse [phone]-

## 2018-07-03 NOTE — Telephone Encounter (Signed)
Oh great that hospice will do this. BMP

## 2018-07-04 NOTE — Telephone Encounter (Signed)
Nolon Nations with Hospice [phone] called and [facility] that she was returning call.  Tried calling her back. LMOM asking if they would draw patient's BMP.

## 2018-07-04 NOTE — Telephone Encounter (Signed)
LM for Nolon Nations to [facility] regarding patient.

## 2018-07-04 NOTE — Telephone Encounter (Signed)
Clydie Braun with Hospice called and stated that they will draw patient's bloodwork and fax [place] the results.

## 2018-07-05 ENCOUNTER — Other Ambulatory Visit: Payer: Medicare HMO

## 2018-07-05 ENCOUNTER — Other Ambulatory Visit: Payer: Self-pay

## 2018-07-06 ENCOUNTER — Encounter: Payer: Self-pay | Admitting: Nurse Practitioner

## 2018-07-06 LAB — BASIC METABOLIC PANEL
BUN: 30 — AB (ref 4–21)
Creatinine: 1 (ref 0.5–1.1)
Glucose: 107
Potassium: 4.4 (ref 3.4–5.3)
Sodium: 138 (ref 137–147)

## 2018-07-27 ENCOUNTER — Other Ambulatory Visit: Payer: Self-pay | Admitting: Cardiology

## 2018-07-27 DIAGNOSIS — I10 Essential (primary) hypertension: Secondary | ICD-10-CM

## 2018-07-27 DIAGNOSIS — I4891 Unspecified atrial fibrillation: Secondary | ICD-10-CM

## 2018-08-20 DEATH — deceased

## 2018-08-21 ENCOUNTER — Ambulatory Visit: Payer: Medicare HMO | Admitting: Nurse Practitioner
